# Patient Record
Sex: Male | Born: 1935 | Race: White | Hispanic: No | Marital: Married | Smoking: Former smoker
Health system: Southern US, Community
[De-identification: ages and names within clinical notes are randomized; demographics above are authoritative.]

## PROBLEM LIST (undated history)

## (undated) DIAGNOSIS — I639 Cerebral infarction, unspecified: Secondary | ICD-10-CM

## (undated) DIAGNOSIS — N289 Disorder of kidney and ureter, unspecified: Secondary | ICD-10-CM

## (undated) DIAGNOSIS — E1140 Type 2 diabetes mellitus with diabetic neuropathy, unspecified: Secondary | ICD-10-CM

## (undated) DIAGNOSIS — G629 Polyneuropathy, unspecified: Secondary | ICD-10-CM

## (undated) DIAGNOSIS — H3530 Unspecified macular degeneration: Secondary | ICD-10-CM

## (undated) DIAGNOSIS — E785 Hyperlipidemia, unspecified: Secondary | ICD-10-CM

## (undated) DIAGNOSIS — G459 Transient cerebral ischemic attack, unspecified: Secondary | ICD-10-CM

## (undated) DIAGNOSIS — E119 Type 2 diabetes mellitus without complications: Secondary | ICD-10-CM

## (undated) DIAGNOSIS — I10 Essential (primary) hypertension: Secondary | ICD-10-CM

## (undated) HISTORY — DX: Hyperlipidemia, unspecified: E78.5

## (undated) HISTORY — PX: OTHER SURGICAL HISTORY: SHX169

## (undated) HISTORY — PX: APPENDECTOMY: SHX54

## (undated) HISTORY — DX: Essential (primary) hypertension: I10

## (undated) HISTORY — DX: Transient cerebral ischemic attack, unspecified: G45.9

## (undated) HISTORY — PX: BACK SURGERY: SHX140

## (undated) HISTORY — PX: TONSILLECTOMY: SUR1361

## (undated) HISTORY — DX: Polyneuropathy, unspecified: G62.9

## (undated) HISTORY — DX: Disorder of kidney and ureter, unspecified: N28.9

## (undated) HISTORY — PX: CERVICAL DISC SURGERY: SHX588

---

## 2000-02-15 ENCOUNTER — Encounter: Admission: RE | Admit: 2000-02-15 | Discharge: 2000-05-15 | Payer: Self-pay | Admitting: Internal Medicine

## 2001-07-01 ENCOUNTER — Emergency Department (HOSPITAL_COMMUNITY): Admission: EM | Admit: 2001-07-01 | Discharge: 2001-07-01 | Payer: Self-pay | Admitting: Emergency Medicine

## 2001-07-01 ENCOUNTER — Encounter: Payer: Self-pay | Admitting: Emergency Medicine

## 2001-07-03 ENCOUNTER — Encounter: Payer: Self-pay | Admitting: Emergency Medicine

## 2001-07-03 ENCOUNTER — Ambulatory Visit (HOSPITAL_COMMUNITY): Admission: RE | Admit: 2001-07-03 | Discharge: 2001-07-03 | Payer: Self-pay | Admitting: Emergency Medicine

## 2001-10-03 ENCOUNTER — Encounter: Payer: Self-pay | Admitting: Internal Medicine

## 2001-10-03 ENCOUNTER — Encounter: Admission: RE | Admit: 2001-10-03 | Discharge: 2001-10-03 | Payer: Self-pay | Admitting: Internal Medicine

## 2004-11-17 ENCOUNTER — Encounter: Admission: RE | Admit: 2004-11-17 | Discharge: 2004-11-17 | Payer: Self-pay | Admitting: Internal Medicine

## 2005-02-17 ENCOUNTER — Inpatient Hospital Stay (HOSPITAL_COMMUNITY): Admission: RE | Admit: 2005-02-17 | Discharge: 2005-02-18 | Payer: Self-pay | Admitting: Neurosurgery

## 2006-03-12 ENCOUNTER — Encounter (INDEPENDENT_AMBULATORY_CARE_PROVIDER_SITE_OTHER): Payer: Self-pay | Admitting: Specialist

## 2006-03-12 ENCOUNTER — Ambulatory Visit (HOSPITAL_COMMUNITY): Admission: RE | Admit: 2006-03-12 | Discharge: 2006-03-12 | Payer: Self-pay | Admitting: *Deleted

## 2008-09-17 ENCOUNTER — Encounter: Admission: RE | Admit: 2008-09-17 | Discharge: 2008-09-17 | Payer: Self-pay | Admitting: Internal Medicine

## 2008-10-08 ENCOUNTER — Encounter (INDEPENDENT_AMBULATORY_CARE_PROVIDER_SITE_OTHER): Payer: Self-pay | Admitting: Neurosurgery

## 2008-10-08 ENCOUNTER — Ambulatory Visit (HOSPITAL_COMMUNITY): Admission: RE | Admit: 2008-10-08 | Discharge: 2008-10-08 | Payer: Self-pay | Admitting: Neurosurgery

## 2008-10-08 ENCOUNTER — Ambulatory Visit: Payer: Self-pay | Admitting: Vascular Surgery

## 2010-08-21 ENCOUNTER — Encounter: Payer: Self-pay | Admitting: Neurosurgery

## 2010-12-16 NOTE — Op Note (Signed)
NAMEKLAYTON, LEVATINO              ACCOUNT NO.:  [id number]   MEDICAL RECORD NO.:  [id number]          PATIENT TYPE:  AMB   LOCATION:  ENDO                         FACILITY:  [facility]   PHYSICIAN:  Waverly Ferrari, M.D.    DATE OF BIRTH:  [birth year]   DATE OF PROCEDURE:  DATE OF DISCHARGE:                                 OPERATIVE REPORT   PROCEDURE:  Colonoscopy with biopsy and eradication of polyp.   ANESTHESIA:  Demerol 100 mg, Versed 10 mg, epinephrine 1:10,000, 1 mL was  used.   PROCEDURE:  With the patient mildly sedated, in the left lateral decubitus  position, the Olympus videoscopic colonoscope was inserted into the rectum,  after normal rectal exam, passed under direct vision to the cecum,  identified by ileocecal valve and base of cecum, both of which were  photographed.  From this point colonoscope was slowly withdrawn, taking  circumferential views of colonic mucosa, stopping at 60 cm from anal verge,  at which point a polyp was seen and photographed.  It was a carpet-like  polyp, fairly superficial and spreading, and this was photographed and  subsequently we biopsied this area to obtain tissue, using the Erbe argon  photocoagulator with a straight shooting probe.  We eradicated the remainder  of the polyp.  There was some bleeding from the eradication site that I  elected to use the Erbe to burn a little further, but it continued to bleed.  Therefore, injected 1 mL of epinephrine into the site from which the blood  seemed to be emanating with cessation then of further bleeding.  A wheal was  seen to be raised subcutaneously by this method.  Once I was satisfied the  bleeding stopped and we eradicated the tissue to my satisfaction, the  colonoscope was then slowly withdrawn, taking circumferential views of  remaining colonic mucosa, stopping only in the rectum, which appeared  normal, and the rectum showed hemorrhoids on retroflexed view.  The  endoscope was straightened  and withdrawn.  The patient's vital signs and  pulse oximeter remained stable.  The patient tolerated procedure well  without apparent complications.   FINDINGS:  Large carpet-like polyp at 60 cm from anal verge, biopsied and  eradicated using argon photocoagulation.   PLAN:  Await biopsy report and clinical response.  The patient will call me  for results and follow up with me as an outpatient.           ______________________________  Waverly Ferrari, M.D.     GMO/MEDQ  D:  03/12/2006  T:  03/12/2006  Job:  [id number]

## 2010-12-16 NOTE — H&P (Signed)
NAME:  Kristopher Zimmerman, Kristopher Zimmerman              ACCOUNT NO.:  [id number]   MEDICAL RECORD NO.:  [id number]          PATIENT TYPE:  INP   LOCATION:  NA                           FACILITY:  [facility]   PHYSICIAN:  Leeroy Cha, M.D.   DATE OF BIRTH:  1923/09/26   DATE OF ADMISSION:  02/22/2005  DATE OF DISCHARGE:                                HISTORY & PHYSICAL   HISTORY OF PRESENT ILLNESS:  This was a young fellow who was seen by me  about two months ago in my office because of back pain radiation to both  legs which gets worse with walking.  He said the pain gets better.  He had  been complaining of burning sensation in both legs and according to him, he  felt that it was all secondary to diabetes.  He had x-rays.  He had failed  with conservative treatment.  He wanted to proceed with surgery.   PAST MEDICAL HISTORY:  Cervical diskectomy.   ALLERGIES:  He is not allergic to any medication.   SOCIAL HISTORY:  He drinks socially.  He does not smoke.   REVIEW OF SYSTEMS:  Positive for diabetes, cataracts, kidney stone.   PHYSICAL EXAMINATION:  GENERAL:  The patient came to my office, walking with  a short step.  HEENT:  Ears, nose and throat normal.  NECK:  Normal.  NODES:  Clear.  HEART:  Sounds normal.  There were no murmurs.  Normal pulses.  NEUROLOGICAL:  Mental status and cranial nerves are normal.  EXTREMITIES:  Shows weakness of the dorsi and plantar flexors in both legs.  He complains of a burning sensation in both feet.   The MRI showed that he has a severe stenosis at the level of 3-4 and 4-5  secondary to degenerative disk disease and ___________ facet.   CLINICAL IMPRESSION:  1.  Lumbar stenosis including neurogenic claudication.  2.  Diabetes mellitus.  3.  Degenerative disk disease lumbar.   RECOMMENDATIONS:  The patient wants to proceed with surgery.  The procedure  will be a decompression at the level of 3-5 with bilateral foraminotomy.  He  knows about the risks such as  infection, cerebrospinal fluid leak, worsening  of pain, paralysis, need for further surgery which might require fusion.     EB/MEDQ  D:  02/17/2005  T:  02/17/2005  Job:  [id number]

## 2010-12-16 NOTE — Op Note (Signed)
Kristopher Zimmerman, Kristopher Zimmerman              ACCOUNT NO.:  [id number]   MEDICAL RECORD NO.:  [id number]          PATIENT TYPE:  INP   LOCATION:  2899                         FACILITY:  [facility]   PHYSICIAN:  Leeroy Cha, M.D.   DATE OF BIRTH:  1924/01/07   DATE OF PROCEDURE:  02/17/2005  DATE OF DISCHARGE:                                 OPERATIVE REPORT   PREOPERATIVE DIAGNOSIS:  L3-5 stenosis with neurogenic claudication.   POSTOPERATIVE DIAGNOSIS:  L3-5 stenosis with neurogenic claudication.   PROCEDURE:  Bilateral L3-5 laminectomies and foraminotomy.   SURGEON:  Leeroy Cha, M.D.   ASSISTANT:  __________.   Patient is being admitted because of back pain with radiation to both legs.  Patient had by x-rays severe stenosis at L3-5.  The patient has a history of  diabetes.  He has failed conservative treatment.  He wants to proceed with  surgery.   PROCEDURE:  The patient was taken to the OR.  He was positioned in a prone  manner.  The back was prepped with Betadine.  A midline incision from L3  down to S1 was made.  Muscles were retracted laterally.  We identified the  spinous process of L3-5, which were removed with a Still rongeur.  Using the  drill as well as the Kerrison punch as well as the Leksell, we did bilateral  laminectomies, removing thick yellow ligament.  Then with the 2-3 mm  Kerrison punch, we did a foraminotomy to decompress the L3-4, L5-S1 nerve  roots.  Having good decompression, x-rays showed that we indeed were at the  level of L3-4 and L5-1.  Valsalva maneuver was negative.  From then on, the  area was irrigated and closed with Vicryl and Steri-Strips.       EB/MEDQ  D:  02/17/2005  T:  02/17/2005  Job:  [id number]

## 2011-09-11 DIAGNOSIS — I10 Essential (primary) hypertension: Secondary | ICD-10-CM | POA: Diagnosis not present

## 2011-09-11 DIAGNOSIS — E119 Type 2 diabetes mellitus without complications: Secondary | ICD-10-CM | POA: Diagnosis not present

## 2011-09-25 DIAGNOSIS — E782 Mixed hyperlipidemia: Secondary | ICD-10-CM | POA: Diagnosis not present

## 2011-09-25 DIAGNOSIS — I10 Essential (primary) hypertension: Secondary | ICD-10-CM | POA: Diagnosis not present

## 2011-09-25 DIAGNOSIS — E119 Type 2 diabetes mellitus without complications: Secondary | ICD-10-CM | POA: Diagnosis not present

## 2011-09-25 DIAGNOSIS — M159 Polyosteoarthritis, unspecified: Secondary | ICD-10-CM | POA: Diagnosis not present

## 2011-09-25 DIAGNOSIS — E1149 Type 2 diabetes mellitus with other diabetic neurological complication: Secondary | ICD-10-CM | POA: Diagnosis not present

## 2011-12-19 DIAGNOSIS — H26499 Other secondary cataract, unspecified eye: Secondary | ICD-10-CM | POA: Diagnosis not present

## 2012-01-01 ENCOUNTER — Ambulatory Visit (INDEPENDENT_AMBULATORY_CARE_PROVIDER_SITE_OTHER): Payer: Medicare Other | Admitting: Ophthalmology

## 2012-01-01 DIAGNOSIS — E11319 Type 2 diabetes mellitus with unspecified diabetic retinopathy without macular edema: Secondary | ICD-10-CM

## 2012-01-01 DIAGNOSIS — H35039 Hypertensive retinopathy, unspecified eye: Secondary | ICD-10-CM | POA: Diagnosis not present

## 2012-01-01 DIAGNOSIS — E1165 Type 2 diabetes mellitus with hyperglycemia: Secondary | ICD-10-CM

## 2012-01-01 DIAGNOSIS — I10 Essential (primary) hypertension: Secondary | ICD-10-CM

## 2012-01-01 DIAGNOSIS — H35059 Retinal neovascularization, unspecified, unspecified eye: Secondary | ICD-10-CM

## 2012-01-01 DIAGNOSIS — H43819 Vitreous degeneration, unspecified eye: Secondary | ICD-10-CM

## 2012-01-17 ENCOUNTER — Encounter (INDEPENDENT_AMBULATORY_CARE_PROVIDER_SITE_OTHER): Payer: Medicare Other | Admitting: Ophthalmology

## 2012-01-17 DIAGNOSIS — H35059 Retinal neovascularization, unspecified, unspecified eye: Secondary | ICD-10-CM

## 2012-02-16 ENCOUNTER — Encounter (INDEPENDENT_AMBULATORY_CARE_PROVIDER_SITE_OTHER): Payer: Medicare Other | Admitting: Ophthalmology

## 2012-02-16 DIAGNOSIS — H3530 Unspecified macular degeneration: Secondary | ICD-10-CM | POA: Diagnosis not present

## 2012-02-16 DIAGNOSIS — H35059 Retinal neovascularization, unspecified, unspecified eye: Secondary | ICD-10-CM | POA: Diagnosis not present

## 2012-03-15 DIAGNOSIS — E119 Type 2 diabetes mellitus without complications: Secondary | ICD-10-CM | POA: Diagnosis not present

## 2012-03-15 DIAGNOSIS — M159 Polyosteoarthritis, unspecified: Secondary | ICD-10-CM | POA: Diagnosis not present

## 2012-03-15 DIAGNOSIS — E782 Mixed hyperlipidemia: Secondary | ICD-10-CM | POA: Diagnosis not present

## 2012-03-15 DIAGNOSIS — I10 Essential (primary) hypertension: Secondary | ICD-10-CM | POA: Diagnosis not present

## 2012-03-15 DIAGNOSIS — E1129 Type 2 diabetes mellitus with other diabetic kidney complication: Secondary | ICD-10-CM | POA: Diagnosis not present

## 2012-03-18 ENCOUNTER — Encounter (INDEPENDENT_AMBULATORY_CARE_PROVIDER_SITE_OTHER): Payer: Medicare Other | Admitting: Ophthalmology

## 2012-03-18 DIAGNOSIS — H35059 Retinal neovascularization, unspecified, unspecified eye: Secondary | ICD-10-CM

## 2012-03-18 DIAGNOSIS — H3530 Unspecified macular degeneration: Secondary | ICD-10-CM | POA: Diagnosis not present

## 2012-03-21 DIAGNOSIS — E1142 Type 2 diabetes mellitus with diabetic polyneuropathy: Secondary | ICD-10-CM | POA: Diagnosis not present

## 2012-03-21 DIAGNOSIS — I10 Essential (primary) hypertension: Secondary | ICD-10-CM | POA: Diagnosis not present

## 2012-03-21 DIAGNOSIS — E1129 Type 2 diabetes mellitus with other diabetic kidney complication: Secondary | ICD-10-CM | POA: Diagnosis not present

## 2012-03-21 DIAGNOSIS — E782 Mixed hyperlipidemia: Secondary | ICD-10-CM | POA: Diagnosis not present

## 2012-03-21 DIAGNOSIS — E119 Type 2 diabetes mellitus without complications: Secondary | ICD-10-CM | POA: Diagnosis not present

## 2012-04-09 DIAGNOSIS — Z23 Encounter for immunization: Secondary | ICD-10-CM | POA: Diagnosis not present

## 2012-04-24 ENCOUNTER — Encounter (INDEPENDENT_AMBULATORY_CARE_PROVIDER_SITE_OTHER): Payer: Medicare Other | Admitting: Ophthalmology

## 2012-04-24 DIAGNOSIS — I10 Essential (primary) hypertension: Secondary | ICD-10-CM

## 2012-04-24 DIAGNOSIS — H43819 Vitreous degeneration, unspecified eye: Secondary | ICD-10-CM

## 2012-04-24 DIAGNOSIS — H35039 Hypertensive retinopathy, unspecified eye: Secondary | ICD-10-CM

## 2012-04-24 DIAGNOSIS — E1139 Type 2 diabetes mellitus with other diabetic ophthalmic complication: Secondary | ICD-10-CM | POA: Diagnosis not present

## 2012-04-24 DIAGNOSIS — E11319 Type 2 diabetes mellitus with unspecified diabetic retinopathy without macular edema: Secondary | ICD-10-CM

## 2012-04-24 DIAGNOSIS — H3530 Unspecified macular degeneration: Secondary | ICD-10-CM | POA: Diagnosis not present

## 2012-04-24 DIAGNOSIS — E1165 Type 2 diabetes mellitus with hyperglycemia: Secondary | ICD-10-CM | POA: Diagnosis not present

## 2012-04-24 DIAGNOSIS — H35059 Retinal neovascularization, unspecified, unspecified eye: Secondary | ICD-10-CM | POA: Diagnosis not present

## 2012-05-24 ENCOUNTER — Encounter (INDEPENDENT_AMBULATORY_CARE_PROVIDER_SITE_OTHER): Payer: Medicare Other | Admitting: Ophthalmology

## 2012-05-24 DIAGNOSIS — H35039 Hypertensive retinopathy, unspecified eye: Secondary | ICD-10-CM

## 2012-05-24 DIAGNOSIS — E11319 Type 2 diabetes mellitus with unspecified diabetic retinopathy without macular edema: Secondary | ICD-10-CM

## 2012-05-24 DIAGNOSIS — H43819 Vitreous degeneration, unspecified eye: Secondary | ICD-10-CM

## 2012-05-24 DIAGNOSIS — H3530 Unspecified macular degeneration: Secondary | ICD-10-CM

## 2012-05-24 DIAGNOSIS — I10 Essential (primary) hypertension: Secondary | ICD-10-CM

## 2012-05-24 DIAGNOSIS — H1045 Other chronic allergic conjunctivitis: Secondary | ICD-10-CM

## 2012-05-24 DIAGNOSIS — E1139 Type 2 diabetes mellitus with other diabetic ophthalmic complication: Secondary | ICD-10-CM

## 2012-05-27 DIAGNOSIS — Z299 Encounter for prophylactic measures, unspecified: Secondary | ICD-10-CM | POA: Diagnosis not present

## 2012-05-27 DIAGNOSIS — E119 Type 2 diabetes mellitus without complications: Secondary | ICD-10-CM | POA: Diagnosis not present

## 2012-05-27 DIAGNOSIS — I10 Essential (primary) hypertension: Secondary | ICD-10-CM | POA: Diagnosis not present

## 2012-05-27 DIAGNOSIS — R21 Rash and other nonspecific skin eruption: Secondary | ICD-10-CM | POA: Diagnosis not present

## 2012-06-05 ENCOUNTER — Encounter (INDEPENDENT_AMBULATORY_CARE_PROVIDER_SITE_OTHER): Payer: Medicare Other | Admitting: Ophthalmology

## 2012-06-05 DIAGNOSIS — I10 Essential (primary) hypertension: Secondary | ICD-10-CM

## 2012-06-05 DIAGNOSIS — H3530 Unspecified macular degeneration: Secondary | ICD-10-CM

## 2012-06-05 DIAGNOSIS — H35059 Retinal neovascularization, unspecified, unspecified eye: Secondary | ICD-10-CM

## 2012-06-05 DIAGNOSIS — H35039 Hypertensive retinopathy, unspecified eye: Secondary | ICD-10-CM

## 2012-06-05 DIAGNOSIS — E11319 Type 2 diabetes mellitus with unspecified diabetic retinopathy without macular edema: Secondary | ICD-10-CM

## 2012-06-05 DIAGNOSIS — E1165 Type 2 diabetes mellitus with hyperglycemia: Secondary | ICD-10-CM

## 2012-06-05 DIAGNOSIS — H43819 Vitreous degeneration, unspecified eye: Secondary | ICD-10-CM

## 2012-07-08 DIAGNOSIS — J0110 Acute frontal sinusitis, unspecified: Secondary | ICD-10-CM | POA: Diagnosis not present

## 2012-07-08 DIAGNOSIS — R5381 Other malaise: Secondary | ICD-10-CM | POA: Diagnosis not present

## 2012-07-08 DIAGNOSIS — R05 Cough: Secondary | ICD-10-CM | POA: Diagnosis not present

## 2012-07-08 DIAGNOSIS — R5383 Other fatigue: Secondary | ICD-10-CM | POA: Diagnosis not present

## 2012-08-02 DIAGNOSIS — H906 Mixed conductive and sensorineural hearing loss, bilateral: Secondary | ICD-10-CM | POA: Diagnosis not present

## 2012-08-05 ENCOUNTER — Encounter (INDEPENDENT_AMBULATORY_CARE_PROVIDER_SITE_OTHER): Payer: Medicare Other | Admitting: Ophthalmology

## 2012-08-05 DIAGNOSIS — H3530 Unspecified macular degeneration: Secondary | ICD-10-CM

## 2012-08-05 DIAGNOSIS — H35039 Hypertensive retinopathy, unspecified eye: Secondary | ICD-10-CM

## 2012-08-05 DIAGNOSIS — I10 Essential (primary) hypertension: Secondary | ICD-10-CM

## 2012-08-05 DIAGNOSIS — E1139 Type 2 diabetes mellitus with other diabetic ophthalmic complication: Secondary | ICD-10-CM

## 2012-08-05 DIAGNOSIS — H43819 Vitreous degeneration, unspecified eye: Secondary | ICD-10-CM

## 2012-08-05 DIAGNOSIS — E11319 Type 2 diabetes mellitus with unspecified diabetic retinopathy without macular edema: Secondary | ICD-10-CM

## 2012-08-06 DIAGNOSIS — H73829 Atrophic nonflaccid tympanic membrane, unspecified ear: Secondary | ICD-10-CM | POA: Diagnosis not present

## 2012-08-06 DIAGNOSIS — H6980 Other specified disorders of Eustachian tube, unspecified ear: Secondary | ICD-10-CM | POA: Diagnosis not present

## 2012-08-06 DIAGNOSIS — H6120 Impacted cerumen, unspecified ear: Secondary | ICD-10-CM | POA: Diagnosis not present

## 2012-08-06 DIAGNOSIS — H908 Mixed conductive and sensorineural hearing loss, unspecified: Secondary | ICD-10-CM | POA: Diagnosis not present

## 2012-09-06 DIAGNOSIS — H6980 Other specified disorders of Eustachian tube, unspecified ear: Secondary | ICD-10-CM | POA: Diagnosis not present

## 2012-09-06 DIAGNOSIS — H73829 Atrophic nonflaccid tympanic membrane, unspecified ear: Secondary | ICD-10-CM | POA: Diagnosis not present

## 2012-09-06 DIAGNOSIS — H908 Mixed conductive and sensorineural hearing loss, unspecified: Secondary | ICD-10-CM | POA: Diagnosis not present

## 2012-09-18 DIAGNOSIS — E782 Mixed hyperlipidemia: Secondary | ICD-10-CM | POA: Diagnosis not present

## 2012-09-18 DIAGNOSIS — I10 Essential (primary) hypertension: Secondary | ICD-10-CM | POA: Diagnosis not present

## 2012-09-18 DIAGNOSIS — E119 Type 2 diabetes mellitus without complications: Secondary | ICD-10-CM | POA: Diagnosis not present

## 2012-09-25 DIAGNOSIS — E782 Mixed hyperlipidemia: Secondary | ICD-10-CM | POA: Diagnosis not present

## 2012-09-25 DIAGNOSIS — I10 Essential (primary) hypertension: Secondary | ICD-10-CM | POA: Diagnosis not present

## 2012-09-25 DIAGNOSIS — M159 Polyosteoarthritis, unspecified: Secondary | ICD-10-CM | POA: Diagnosis not present

## 2012-09-25 DIAGNOSIS — E119 Type 2 diabetes mellitus without complications: Secondary | ICD-10-CM | POA: Diagnosis not present

## 2012-09-27 DIAGNOSIS — H6980 Other specified disorders of Eustachian tube, unspecified ear: Secondary | ICD-10-CM | POA: Diagnosis not present

## 2012-10-04 ENCOUNTER — Encounter (INDEPENDENT_AMBULATORY_CARE_PROVIDER_SITE_OTHER): Payer: Medicare Other | Admitting: Ophthalmology

## 2012-10-17 ENCOUNTER — Encounter (INDEPENDENT_AMBULATORY_CARE_PROVIDER_SITE_OTHER): Payer: Medicare Other | Admitting: Ophthalmology

## 2012-10-17 DIAGNOSIS — E11319 Type 2 diabetes mellitus with unspecified diabetic retinopathy without macular edema: Secondary | ICD-10-CM | POA: Diagnosis not present

## 2012-10-17 DIAGNOSIS — E1165 Type 2 diabetes mellitus with hyperglycemia: Secondary | ICD-10-CM | POA: Diagnosis not present

## 2012-10-17 DIAGNOSIS — H3530 Unspecified macular degeneration: Secondary | ICD-10-CM | POA: Diagnosis not present

## 2012-10-17 DIAGNOSIS — H43819 Vitreous degeneration, unspecified eye: Secondary | ICD-10-CM

## 2012-10-17 DIAGNOSIS — H35039 Hypertensive retinopathy, unspecified eye: Secondary | ICD-10-CM

## 2012-10-17 DIAGNOSIS — E1139 Type 2 diabetes mellitus with other diabetic ophthalmic complication: Secondary | ICD-10-CM | POA: Diagnosis not present

## 2012-10-17 DIAGNOSIS — I10 Essential (primary) hypertension: Secondary | ICD-10-CM

## 2012-10-28 DIAGNOSIS — H66019 Acute suppurative otitis media with spontaneous rupture of ear drum, unspecified ear: Secondary | ICD-10-CM | POA: Diagnosis not present

## 2012-11-06 DIAGNOSIS — H66019 Acute suppurative otitis media with spontaneous rupture of ear drum, unspecified ear: Secondary | ICD-10-CM | POA: Diagnosis not present

## 2012-11-06 DIAGNOSIS — H6980 Other specified disorders of Eustachian tube, unspecified ear: Secondary | ICD-10-CM | POA: Diagnosis not present

## 2012-11-11 DIAGNOSIS — L719 Rosacea, unspecified: Secondary | ICD-10-CM | POA: Diagnosis not present

## 2012-11-11 DIAGNOSIS — H0019 Chalazion unspecified eye, unspecified eyelid: Secondary | ICD-10-CM | POA: Diagnosis not present

## 2012-11-21 DIAGNOSIS — S61409A Unspecified open wound of unspecified hand, initial encounter: Secondary | ICD-10-CM | POA: Diagnosis not present

## 2012-11-21 DIAGNOSIS — L089 Local infection of the skin and subcutaneous tissue, unspecified: Secondary | ICD-10-CM | POA: Diagnosis not present

## 2012-12-02 DIAGNOSIS — T148XXA Other injury of unspecified body region, initial encounter: Secondary | ICD-10-CM | POA: Diagnosis not present

## 2012-12-02 DIAGNOSIS — L089 Local infection of the skin and subcutaneous tissue, unspecified: Secondary | ICD-10-CM | POA: Diagnosis not present

## 2012-12-18 DIAGNOSIS — H66019 Acute suppurative otitis media with spontaneous rupture of ear drum, unspecified ear: Secondary | ICD-10-CM | POA: Diagnosis not present

## 2012-12-18 DIAGNOSIS — H663X9 Other chronic suppurative otitis media, unspecified ear: Secondary | ICD-10-CM | POA: Diagnosis not present

## 2012-12-20 DIAGNOSIS — H02139 Senile ectropion of unspecified eye, unspecified eyelid: Secondary | ICD-10-CM | POA: Diagnosis not present

## 2012-12-20 DIAGNOSIS — L719 Rosacea, unspecified: Secondary | ICD-10-CM | POA: Diagnosis not present

## 2012-12-20 DIAGNOSIS — H16149 Punctate keratitis, unspecified eye: Secondary | ICD-10-CM | POA: Diagnosis not present

## 2012-12-21 ENCOUNTER — Encounter (HOSPITAL_COMMUNITY): Payer: Self-pay | Admitting: *Deleted

## 2012-12-21 ENCOUNTER — Emergency Department (HOSPITAL_COMMUNITY): Payer: Medicare Other

## 2012-12-21 ENCOUNTER — Emergency Department (HOSPITAL_COMMUNITY)
Admission: EM | Admit: 2012-12-21 | Discharge: 2012-12-21 | Disposition: A | Discharge status: Home / Self Care | Payer: Medicare Other | Attending: Emergency Medicine | Admitting: Emergency Medicine

## 2012-12-21 ENCOUNTER — Other Ambulatory Visit: Payer: Self-pay

## 2012-12-21 DIAGNOSIS — Z87891 Personal history of nicotine dependence: Secondary | ICD-10-CM | POA: Diagnosis not present

## 2012-12-21 DIAGNOSIS — Z792 Long term (current) use of antibiotics: Secondary | ICD-10-CM | POA: Insufficient documentation

## 2012-12-21 DIAGNOSIS — Z791 Long term (current) use of non-steroidal anti-inflammatories (NSAID): Secondary | ICD-10-CM | POA: Diagnosis not present

## 2012-12-21 DIAGNOSIS — Z79899 Other long term (current) drug therapy: Secondary | ICD-10-CM | POA: Diagnosis not present

## 2012-12-21 DIAGNOSIS — Z7982 Long term (current) use of aspirin: Secondary | ICD-10-CM | POA: Diagnosis not present

## 2012-12-21 DIAGNOSIS — G510 Bell's palsy: Secondary | ICD-10-CM

## 2012-12-21 DIAGNOSIS — R1310 Dysphagia, unspecified: Secondary | ICD-10-CM | POA: Diagnosis not present

## 2012-12-21 DIAGNOSIS — E1149 Type 2 diabetes mellitus with other diabetic neurological complication: Secondary | ICD-10-CM | POA: Insufficient documentation

## 2012-12-21 DIAGNOSIS — R29810 Facial weakness: Secondary | ICD-10-CM | POA: Diagnosis not present

## 2012-12-21 DIAGNOSIS — E1142 Type 2 diabetes mellitus with diabetic polyneuropathy: Secondary | ICD-10-CM | POA: Diagnosis not present

## 2012-12-21 DIAGNOSIS — Z789 Other specified health status: Secondary | ICD-10-CM | POA: Diagnosis not present

## 2012-12-21 HISTORY — DX: Type 2 diabetes mellitus with diabetic neuropathy, unspecified: E11.40

## 2012-12-21 HISTORY — DX: Type 2 diabetes mellitus without complications: E11.9

## 2012-12-21 LAB — DIFFERENTIAL
Basophils Absolute: 0 10*3/uL (ref 0.0–0.1)
Eosinophils Relative: 2 % (ref 0–5)
Lymphocytes Relative: 16 % (ref 12–46)
Monocytes Absolute: 0.6 10*3/uL (ref 0.1–1.0)
Monocytes Relative: 10 % (ref 3–12)
Neutro Abs: 4.4 10*3/uL (ref 1.7–7.7)

## 2012-12-21 LAB — URINALYSIS, ROUTINE W REFLEX MICROSCOPIC
Bilirubin Urine: NEGATIVE
Glucose, UA: NEGATIVE mg/dL
Hgb urine dipstick: NEGATIVE
Specific Gravity, Urine: 1.011 (ref 1.005–1.030)
Urobilinogen, UA: 0.2 mg/dL (ref 0.0–1.0)
pH: 6.5 (ref 5.0–8.0)

## 2012-12-21 LAB — COMPREHENSIVE METABOLIC PANEL
AST: 20 U/L (ref 0–37)
BUN: 26 mg/dL — ABNORMAL HIGH (ref 6–23)
CO2: 28 mEq/L (ref 19–32)
Calcium: 9.7 mg/dL (ref 8.4–10.5)
Chloride: 100 mEq/L (ref 96–112)
Creatinine, Ser: 1.34 mg/dL (ref 0.50–1.35)
GFR calc Af Amer: 54 mL/min — ABNORMAL LOW (ref >90)
GFR calc non Af Amer: 46 mL/min — ABNORMAL LOW (ref >90)
Glucose, Bld: 151 mg/dL — ABNORMAL HIGH (ref 70–99)
Total Bilirubin: 0.2 mg/dL — ABNORMAL LOW (ref 0.3–1.2)

## 2012-12-21 LAB — CBC
HCT: 31.1 % — ABNORMAL LOW (ref 39.0–52.0)
Hemoglobin: 10.5 g/dL — ABNORMAL LOW (ref 13.0–17.0)
MCV: 91.2 fL (ref 78.0–100.0)
RDW: 13.1 % (ref 11.5–15.5)
WBC: 6.2 10*3/uL (ref 4.0–10.5)

## 2012-12-21 LAB — RAPID URINE DRUG SCREEN, HOSP PERFORMED
Cocaine: NOT DETECTED
Opiates: NOT DETECTED
Tetrahydrocannabinol: NOT DETECTED

## 2012-12-21 LAB — GLUCOSE, CAPILLARY: Glucose-Capillary: 159 mg/dL — ABNORMAL HIGH (ref 70–99)

## 2012-12-21 LAB — TROPONIN I: Troponin I: <0.3 ng/mL (ref <0.30)

## 2012-12-21 LAB — ETHANOL: Alcohol, Ethyl (B): 19 mg/dL — ABNORMAL HIGH (ref 0–11)

## 2012-12-21 MED ORDER — VALACYCLOVIR HCL 1 G PO TABS: 1000.0000 mg | ORAL_TABLET | Freq: Three times a day (TID) | ORAL | Status: DC

## 2012-12-21 NOTE — ED Notes (Signed)
Per wife: pt had tubes put in bilat ears 2 weeks ago, has left ear drainage. Saw same MD, dx with ear infection, placed on antibiotic. 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 Placed on different antibiotic for eye and eyedrops. Pt c/o headache for 1 week. This morning pt woke with left sided facial drooping which has progressively gotten worse. Wife performed neuro check, no slurred speech or any other stroke symptoms. Hx of diabetic neuropathy

## 2012-12-21 NOTE — ED Provider Notes (Signed)
History     CSN: [id number]  Arrival date & time 12/21/12  1452   First MD Initiated Contact with Patient 12/21/12 1504      Chief Complaint  Patient presents with  . Facial Droop    (Consider location/radiation/quality/duration/timing/severity/associated sxs/prior treatment) HPI Comments: 79 year old male with a history of diabetes with neuropathy, recent problems with his eye on the left requiring topical antibiotic drops as well as oral antibiotic drops and problems with his left ear with infections requiring tympanostomy tube placement. The patient has been placed on topical antibiotics for his ear as well. He has had no other changes in his hearing but states that when he woke up this morning he had facial droop on the left, difficulty swallowing water because it drained out the side of his mouth. He had no problems with his vision, no problems with his speech, no difficulty ambulating and no difficulty using his hands. This has been persistent, nothing seems to make it better or worse, not associated with change in hearing or change in taste or change in sensation of the face. The family member presenting with the patient states that she took him to the ophthalmologist because his left eye seemed to be having a foreign body sensation this week. They told her that he had rosacea in his eye according to the patient's spouse and would require topical ophthalmologic antibiotics and oral antibiotics.  The history is provided by the patient.    Past Medical History  Diagnosis Date  . Diabetes mellitus without complication   . Neuropathy, diabetic     Past Surgical History  Procedure Laterality Date  . Back surgery      No family history on file.  History  Substance Use Topics  . Smoking status: Former Smoker    Quit date: 12/22/1979  . Smokeless tobacco: Not on file  . Alcohol Use: 0.6 oz/week    1 Glasses of wine per week     Comment: wine      Review of Systems  All  other systems reviewed and are negative.    Allergies  Review of patient's allergies indicates no known allergies.  Home Medications   Current Outpatient Rx  Name  Route  Sig  Dispense  Refill  . aspirin EC 81 MG tablet   Oral   Take 81 mg by mouth daily.         Marland Kitchen b complex vitamins tablet   Oral   Take 1 tablet by mouth daily.         . calcium carbonate (OS-CAL) 600 MG TABS   Oral   Take 600 mg by mouth 2 (two) times daily with a meal.         . clindamycin (CLEOCIN) 300 MG capsule   Oral   Take 300 mg by mouth 3 (three) times daily.         Marland Kitchen doxycycline (VIBRA-TABS) 100 MG tablet   Oral   Take 100 mg by mouth 2 (two) times daily.         . DULoxetine (CYMBALTA) 30 MG capsule   Oral   Take 30 mg by mouth daily. Take with a 60 to make 90         . DULoxetine (CYMBALTA) 60 MG capsule   Oral   Take 60 mg by mouth daily. Take with 30 to make 90         . GLYBURIDE-METFORMIN PO   Oral   Take 1 tablet  by mouth daily.         . hydrochlorothiazide (HYDRODIURIL) 25 MG tablet   Oral   Take 25 mg by mouth daily.         Marland Kitchen LORazepam (ATIVAN) 2 MG tablet   Oral   Take 2 mg by mouth at bedtime.         Marland Kitchen loteprednol (ALREX) 0.2 % SUSP   Ophthalmic   Apply 1 drop to eye 2 (two) times daily.         . Magnesium Citrate 100 MG TABS   Oral   Take 1 tablet by mouth daily.         . meloxicam (MOBIC) 15 MG tablet   Oral   Take 15 mg by mouth daily.         . pramipexole (MIRAPEX) 0.5 MG tablet   Oral   Take 0.5 mg by mouth 3 (three) times daily.         . pravastatin (PRAVACHOL) 40 MG tablet   Oral   Take 40 mg by mouth daily.         Marland Kitchen Propylene Glycol (SYSTANE BALANCE) 0.6 % SOLN   Ophthalmic   Apply 1 drop to eye 4 (four) times daily.         . pseudoephedrine-acetaminophen (TYLENOL SINUS) 30-500 MG TABS   Oral   Take 1 tablet by mouth every 4 (four) hours as needed (for pain).         . ramipril (ALTACE) 5 MG  capsule   Oral   Take 5 mg by mouth 2 (two) times daily.         . tamsulosin (FLOMAX) 0.4 MG CAPS   Oral   Take by mouth.         . tobramycin-dexamethasone (TOBRADEX) ophthalmic ointment   Left Ear   Place 1 application into the left ear 2 (two) times daily.         . vitamin C (ASCORBIC ACID) 500 MG tablet   Oral   Take 500 mg by mouth daily.         . valACYclovir (VALTREX) 1000 MG tablet   Oral   Take 1 tablet (1,000 mg total) by mouth 3 (three) times daily.   21 tablet   0     BP 147/76  Pulse 62  Temp(Src) 97.9 F (36.6 C)  Resp 18  SpO2 96%  Physical Exam  Nursing note and vitals reviewed. Constitutional: He appears well-developed and well-nourished. No distress.  HENT:  Head: Normocephalic and atraumatic.  Mouth/Throat: Oropharynx is clear and moist. No oropharyngeal exudate.  Left-sided facial droop, refer to neurologic exam, mucous membranes moist, tympanic membranes intact with tympanostomy tubes present, drainage from the tube on the left, no erythema, no tenderness with manipulation of the external ear  Eyes: Conjunctivae and EOM are normal. Pupils are equal, round, and reactive to light. Right eye exhibits no discharge. Left eye exhibits no discharge. No scleral icterus.  Palpebral conjunctiva of the left appears erythematous, bulbar conjunctiva is not inflamed, pupils are 2 mm and equal bilaterally, extraocular movements are intact  Neck: Normal range of motion. Neck supple. No JVD present. No thyromegaly present.  Cardiovascular: Normal rate, regular rhythm, normal heart sounds and intact distal pulses.  Exam reveals no gallop and no friction rub.   No murmur heard. No carotid bruit  Pulmonary/Chest: Effort normal and breath sounds normal. No respiratory distress. He has no wheezes. He has no rales.  Abdominal: Soft. Bowel sounds are normal. He exhibits no distension and no mass. There is no tenderness.  Musculoskeletal: Normal range of motion.  He exhibits no edema and no tenderness.  Lymphadenopathy:    He has no cervical adenopathy.  Neurological: He is alert. Coordination normal.  Speech is clear, all 4 extremities with normal strength and sensation, normal gait, normal finger-nose-finger, normal strength 5 out of 5, and normal visual acuity, left-sided facial droop present, left side of the forehead does not seem to rise with the right side of the forehead. The patient is unable to close his left eye tightly. Sensation intact to the face, cranial nerves III through XII otherwise intact.  Skin: Skin is warm and dry. No rash noted. No erythema.  Psychiatric: He has a normal mood and affect. His behavior is normal.    ED Course  Procedures (including critical care time)  Labs Reviewed  ETHANOL - Abnormal; Notable for the following:    Alcohol, Ethyl (B) 19 (*)    All other components within normal limits  CBC - Abnormal; Notable for the following:    RBC 3.41 (*)    Hemoglobin 10.5 (*)    HCT 31.1 (*)    All other components within normal limits  COMPREHENSIVE METABOLIC PANEL - Abnormal; Notable for the following:    Glucose, Bld 151 (*)    BUN 26 (*)    Albumin 3.3 (*)    Total Bilirubin 0.2 (*)    GFR calc non Af Amer 46 (*)    GFR calc Af Amer 54 (*)    All other components within normal limits  GLUCOSE, CAPILLARY - Abnormal; Notable for the following:    Glucose-Capillary 159 (*)    All other components within normal limits  PROTIME-INR  APTT  DIFFERENTIAL  TROPONIN I  URINE RAPID DRUG SCREEN (HOSP PERFORMED)  URINALYSIS, ROUTINE W REFLEX MICROSCOPIC   Ct Head Wo Contrast  12/21/2012   *RADIOLOGY REPORT*  Clinical Data: Nausea/vomiting, weakness, facial droop  CT HEAD WITHOUT CONTRAST  Technique:  Contiguous axial images were obtained from the base of the skull through the vertex without contrast.  Comparison: None.  Findings: No evidence of parenchymal hemorrhage or extra-axial fluid collection. No mass lesion,  mass effect, or midline shift.  No CT evidence of acute infarction.  Subcortical white matter and periventricular small vessel ischemic changes.  Cerebral volume is age appropriate.  No ventriculomegaly.  The visualized paranasal sinuses are essentially clear.  Minimal fluid in the left mastoid.  Right mastoid is under pneumatized.  No evidence of calvarial fracture.  IMPRESSION: No evidence of acute intracranial abnormality.  Small vessel ischemic changes.   Original Report Authenticated By: Julian Hy, M.D.     1. Bell's palsy       MDM  The patient has likely had a Bell's palsy, he has had recent difficulties with this year on the left, he has no other symptoms that would suggest a stroke like etiology however due to patient's age, history of diabetes and recent antibiotic use I will perform a stroke like workup with CT scan of the head and blood work. The patient awoke with the symptoms as he is not a candidate for any thrombolytic therapy should this be a stroke.   CT negative, VS stable, labs unremarkable - pt and family informed of results.  Likely Bell's, stable for d/c.  Return precautions given.  Meds given in ED:  Medications - No data to display  New Prescriptions   VALACYCLOVIR (VALTREX) 1000 MG TABLET    Take 1 tablet (1,000 mg total) by mouth 3 (three) times daily.         Johnna Acosta, MD 12/21/12 [phone]

## 2012-12-24 LAB — POCT I-STAT, CHEM 8
Calcium, Ion: 1.27 mmol/L (ref 1.13–1.30)
Creatinine, Ser: 1.3 mg/dL (ref 0.50–1.35)
Glucose, Bld: 148 mg/dL — ABNORMAL HIGH (ref 70–99)
HCT: 31 % — ABNORMAL LOW (ref 39.0–52.0)
Hemoglobin: 10.5 g/dL — ABNORMAL LOW (ref 13.0–17.0)
Potassium: 4.7 mEq/L (ref 3.5–5.1)
TCO2: 29 mmol/L (ref 0–100)

## 2012-12-24 LAB — POCT I-STAT TROPONIN I: Troponin i, poc: 0.01 ng/mL (ref 0.00–0.08)

## 2012-12-27 ENCOUNTER — Other Ambulatory Visit: Payer: Self-pay | Admitting: Internal Medicine

## 2012-12-27 DIAGNOSIS — R279 Unspecified lack of coordination: Secondary | ICD-10-CM | POA: Diagnosis not present

## 2012-12-27 DIAGNOSIS — G510 Bell's palsy: Secondary | ICD-10-CM | POA: Diagnosis not present

## 2012-12-27 DIAGNOSIS — M159 Polyosteoarthritis, unspecified: Secondary | ICD-10-CM | POA: Diagnosis not present

## 2012-12-27 DIAGNOSIS — I10 Essential (primary) hypertension: Secondary | ICD-10-CM | POA: Diagnosis not present

## 2012-12-27 DIAGNOSIS — E782 Mixed hyperlipidemia: Secondary | ICD-10-CM | POA: Diagnosis not present

## 2012-12-27 DIAGNOSIS — R51 Headache: Secondary | ICD-10-CM | POA: Diagnosis not present

## 2012-12-27 DIAGNOSIS — E119 Type 2 diabetes mellitus without complications: Secondary | ICD-10-CM | POA: Diagnosis not present

## 2012-12-27 DIAGNOSIS — H9190 Unspecified hearing loss, unspecified ear: Secondary | ICD-10-CM | POA: Diagnosis not present

## 2012-12-30 DIAGNOSIS — L719 Rosacea, unspecified: Secondary | ICD-10-CM | POA: Diagnosis not present

## 2012-12-30 DIAGNOSIS — Z961 Presence of intraocular lens: Secondary | ICD-10-CM | POA: Diagnosis not present

## 2012-12-30 DIAGNOSIS — G510 Bell's palsy: Secondary | ICD-10-CM | POA: Diagnosis not present

## 2012-12-30 DIAGNOSIS — H16219 Exposure keratoconjunctivitis, unspecified eye: Secondary | ICD-10-CM | POA: Diagnosis not present

## 2012-12-31 DIAGNOSIS — I10 Essential (primary) hypertension: Secondary | ICD-10-CM | POA: Diagnosis not present

## 2012-12-31 DIAGNOSIS — E119 Type 2 diabetes mellitus without complications: Secondary | ICD-10-CM | POA: Diagnosis not present

## 2012-12-31 DIAGNOSIS — G510 Bell's palsy: Secondary | ICD-10-CM | POA: Diagnosis not present

## 2012-12-31 DIAGNOSIS — E782 Mixed hyperlipidemia: Secondary | ICD-10-CM | POA: Diagnosis not present

## 2013-01-03 ENCOUNTER — Ambulatory Visit
Admission: RE | Admit: 2013-01-03 | Discharge: 2013-01-03 | Disposition: A | Discharge status: Home / Self Care | Payer: Medicare Other | Source: Ambulatory Visit | Attending: Internal Medicine | Admitting: Internal Medicine

## 2013-01-03 DIAGNOSIS — G510 Bell's palsy: Secondary | ICD-10-CM

## 2013-01-03 DIAGNOSIS — R51 Headache: Secondary | ICD-10-CM | POA: Diagnosis not present

## 2013-01-03 DIAGNOSIS — R42 Dizziness and giddiness: Secondary | ICD-10-CM | POA: Diagnosis not present

## 2013-01-13 DIAGNOSIS — H02139 Senile ectropion of unspecified eye, unspecified eyelid: Secondary | ICD-10-CM | POA: Diagnosis not present

## 2013-01-13 DIAGNOSIS — H16219 Exposure keratoconjunctivitis, unspecified eye: Secondary | ICD-10-CM | POA: Diagnosis not present

## 2013-01-13 DIAGNOSIS — G510 Bell's palsy: Secondary | ICD-10-CM | POA: Diagnosis not present

## 2013-01-13 DIAGNOSIS — L719 Rosacea, unspecified: Secondary | ICD-10-CM | POA: Diagnosis not present

## 2013-01-15 ENCOUNTER — Encounter (INDEPENDENT_AMBULATORY_CARE_PROVIDER_SITE_OTHER): Payer: Medicare Other | Admitting: Ophthalmology

## 2013-01-15 DIAGNOSIS — E1139 Type 2 diabetes mellitus with other diabetic ophthalmic complication: Secondary | ICD-10-CM

## 2013-01-15 DIAGNOSIS — H35039 Hypertensive retinopathy, unspecified eye: Secondary | ICD-10-CM

## 2013-01-15 DIAGNOSIS — H3530 Unspecified macular degeneration: Secondary | ICD-10-CM

## 2013-01-15 DIAGNOSIS — E11319 Type 2 diabetes mellitus with unspecified diabetic retinopathy without macular edema: Secondary | ICD-10-CM | POA: Diagnosis not present

## 2013-01-15 DIAGNOSIS — I10 Essential (primary) hypertension: Secondary | ICD-10-CM

## 2013-01-15 DIAGNOSIS — H43819 Vitreous degeneration, unspecified eye: Secondary | ICD-10-CM

## 2013-01-27 DIAGNOSIS — G510 Bell's palsy: Secondary | ICD-10-CM | POA: Diagnosis not present

## 2013-01-27 DIAGNOSIS — E782 Mixed hyperlipidemia: Secondary | ICD-10-CM | POA: Diagnosis not present

## 2013-01-27 DIAGNOSIS — I10 Essential (primary) hypertension: Secondary | ICD-10-CM | POA: Diagnosis not present

## 2013-02-11 DIAGNOSIS — H353290 Exudative age-related macular degeneration, unspecified eye, stage unspecified: Secondary | ICD-10-CM | POA: Diagnosis not present

## 2013-02-11 DIAGNOSIS — E119 Type 2 diabetes mellitus without complications: Secondary | ICD-10-CM | POA: Diagnosis not present

## 2013-02-11 DIAGNOSIS — H35059 Retinal neovascularization, unspecified, unspecified eye: Secondary | ICD-10-CM | POA: Diagnosis not present

## 2013-02-11 DIAGNOSIS — H04129 Dry eye syndrome of unspecified lacrimal gland: Secondary | ICD-10-CM | POA: Diagnosis not present

## 2013-02-11 DIAGNOSIS — H16149 Punctate keratitis, unspecified eye: Secondary | ICD-10-CM | POA: Diagnosis not present

## 2013-02-12 ENCOUNTER — Encounter (INDEPENDENT_AMBULATORY_CARE_PROVIDER_SITE_OTHER): Payer: Medicare Other | Admitting: Ophthalmology

## 2013-02-12 DIAGNOSIS — E11319 Type 2 diabetes mellitus with unspecified diabetic retinopathy without macular edema: Secondary | ICD-10-CM

## 2013-02-12 DIAGNOSIS — S0550XA Penetrating wound with foreign body of unspecified eyeball, initial encounter: Secondary | ICD-10-CM

## 2013-02-12 DIAGNOSIS — H35039 Hypertensive retinopathy, unspecified eye: Secondary | ICD-10-CM

## 2013-02-12 DIAGNOSIS — I10 Essential (primary) hypertension: Secondary | ICD-10-CM | POA: Diagnosis not present

## 2013-02-12 DIAGNOSIS — H43819 Vitreous degeneration, unspecified eye: Secondary | ICD-10-CM

## 2013-02-12 DIAGNOSIS — H3530 Unspecified macular degeneration: Secondary | ICD-10-CM | POA: Diagnosis not present

## 2013-02-12 DIAGNOSIS — E1139 Type 2 diabetes mellitus with other diabetic ophthalmic complication: Secondary | ICD-10-CM

## 2013-02-14 DIAGNOSIS — H663X9 Other chronic suppurative otitis media, unspecified ear: Secondary | ICD-10-CM | POA: Diagnosis not present

## 2013-03-04 DIAGNOSIS — E1142 Type 2 diabetes mellitus with diabetic polyneuropathy: Secondary | ICD-10-CM | POA: Diagnosis not present

## 2013-03-04 DIAGNOSIS — R079 Chest pain, unspecified: Secondary | ICD-10-CM | POA: Diagnosis not present

## 2013-03-04 DIAGNOSIS — IMO0002 Reserved for concepts with insufficient information to code with codable children: Secondary | ICD-10-CM | POA: Diagnosis not present

## 2013-03-05 DIAGNOSIS — H6980 Other specified disorders of Eustachian tube, unspecified ear: Secondary | ICD-10-CM | POA: Diagnosis not present

## 2013-03-05 DIAGNOSIS — H66019 Acute suppurative otitis media with spontaneous rupture of ear drum, unspecified ear: Secondary | ICD-10-CM | POA: Diagnosis not present

## 2013-03-12 ENCOUNTER — Encounter (INDEPENDENT_AMBULATORY_CARE_PROVIDER_SITE_OTHER): Payer: Medicare Other | Admitting: Ophthalmology

## 2013-03-12 DIAGNOSIS — I10 Essential (primary) hypertension: Secondary | ICD-10-CM

## 2013-03-12 DIAGNOSIS — H43819 Vitreous degeneration, unspecified eye: Secondary | ICD-10-CM

## 2013-03-12 DIAGNOSIS — H3530 Unspecified macular degeneration: Secondary | ICD-10-CM

## 2013-03-12 DIAGNOSIS — H353290 Exudative age-related macular degeneration, unspecified eye, stage unspecified: Secondary | ICD-10-CM | POA: Diagnosis not present

## 2013-03-12 DIAGNOSIS — H35039 Hypertensive retinopathy, unspecified eye: Secondary | ICD-10-CM

## 2013-03-12 DIAGNOSIS — E11319 Type 2 diabetes mellitus with unspecified diabetic retinopathy without macular edema: Secondary | ICD-10-CM

## 2013-03-25 DIAGNOSIS — Z1331 Encounter for screening for depression: Secondary | ICD-10-CM | POA: Diagnosis not present

## 2013-03-25 DIAGNOSIS — E782 Mixed hyperlipidemia: Secondary | ICD-10-CM | POA: Diagnosis not present

## 2013-03-25 DIAGNOSIS — Z0000 Encounter for general adult medical examination without abnormal findings: Secondary | ICD-10-CM | POA: Diagnosis not present

## 2013-03-25 DIAGNOSIS — E119 Type 2 diabetes mellitus without complications: Secondary | ICD-10-CM | POA: Diagnosis not present

## 2013-03-25 DIAGNOSIS — I10 Essential (primary) hypertension: Secondary | ICD-10-CM | POA: Diagnosis not present

## 2013-04-01 DIAGNOSIS — E119 Type 2 diabetes mellitus without complications: Secondary | ICD-10-CM | POA: Diagnosis not present

## 2013-04-01 DIAGNOSIS — E1149 Type 2 diabetes mellitus with other diabetic neurological complication: Secondary | ICD-10-CM | POA: Diagnosis not present

## 2013-04-01 DIAGNOSIS — M159 Polyosteoarthritis, unspecified: Secondary | ICD-10-CM | POA: Diagnosis not present

## 2013-04-01 DIAGNOSIS — E1142 Type 2 diabetes mellitus with diabetic polyneuropathy: Secondary | ICD-10-CM | POA: Diagnosis not present

## 2013-04-01 DIAGNOSIS — I10 Essential (primary) hypertension: Secondary | ICD-10-CM | POA: Diagnosis not present

## 2013-04-01 DIAGNOSIS — E782 Mixed hyperlipidemia: Secondary | ICD-10-CM | POA: Diagnosis not present

## 2013-04-02 DIAGNOSIS — Z23 Encounter for immunization: Secondary | ICD-10-CM | POA: Diagnosis not present

## 2013-04-09 ENCOUNTER — Encounter (INDEPENDENT_AMBULATORY_CARE_PROVIDER_SITE_OTHER): Payer: Medicare Other | Admitting: Ophthalmology

## 2013-04-09 DIAGNOSIS — H353290 Exudative age-related macular degeneration, unspecified eye, stage unspecified: Secondary | ICD-10-CM

## 2013-04-09 DIAGNOSIS — H3530 Unspecified macular degeneration: Secondary | ICD-10-CM

## 2013-04-09 DIAGNOSIS — H43819 Vitreous degeneration, unspecified eye: Secondary | ICD-10-CM

## 2013-04-09 DIAGNOSIS — H35039 Hypertensive retinopathy, unspecified eye: Secondary | ICD-10-CM

## 2013-04-09 DIAGNOSIS — I10 Essential (primary) hypertension: Secondary | ICD-10-CM | POA: Diagnosis not present

## 2013-05-06 DIAGNOSIS — L719 Rosacea, unspecified: Secondary | ICD-10-CM | POA: Diagnosis not present

## 2013-05-06 DIAGNOSIS — H16149 Punctate keratitis, unspecified eye: Secondary | ICD-10-CM | POA: Diagnosis not present

## 2013-05-06 DIAGNOSIS — H04129 Dry eye syndrome of unspecified lacrimal gland: Secondary | ICD-10-CM | POA: Diagnosis not present

## 2013-05-07 ENCOUNTER — Encounter (INDEPENDENT_AMBULATORY_CARE_PROVIDER_SITE_OTHER): Payer: Medicare Other | Admitting: Ophthalmology

## 2013-05-07 DIAGNOSIS — H35039 Hypertensive retinopathy, unspecified eye: Secondary | ICD-10-CM | POA: Diagnosis not present

## 2013-05-07 DIAGNOSIS — H353290 Exudative age-related macular degeneration, unspecified eye, stage unspecified: Secondary | ICD-10-CM | POA: Diagnosis not present

## 2013-05-07 DIAGNOSIS — H3530 Unspecified macular degeneration: Secondary | ICD-10-CM

## 2013-05-07 DIAGNOSIS — H43819 Vitreous degeneration, unspecified eye: Secondary | ICD-10-CM

## 2013-05-07 DIAGNOSIS — I10 Essential (primary) hypertension: Secondary | ICD-10-CM

## 2013-06-04 ENCOUNTER — Encounter (INDEPENDENT_AMBULATORY_CARE_PROVIDER_SITE_OTHER): Payer: Medicare Other | Admitting: Ophthalmology

## 2013-06-04 DIAGNOSIS — H43819 Vitreous degeneration, unspecified eye: Secondary | ICD-10-CM

## 2013-06-04 DIAGNOSIS — H353290 Exudative age-related macular degeneration, unspecified eye, stage unspecified: Secondary | ICD-10-CM | POA: Diagnosis not present

## 2013-06-04 DIAGNOSIS — H3530 Unspecified macular degeneration: Secondary | ICD-10-CM

## 2013-06-04 DIAGNOSIS — H35039 Hypertensive retinopathy, unspecified eye: Secondary | ICD-10-CM | POA: Diagnosis not present

## 2013-06-04 DIAGNOSIS — I10 Essential (primary) hypertension: Secondary | ICD-10-CM

## 2013-07-07 DIAGNOSIS — H04129 Dry eye syndrome of unspecified lacrimal gland: Secondary | ICD-10-CM | POA: Diagnosis not present

## 2013-07-07 DIAGNOSIS — L719 Rosacea, unspecified: Secondary | ICD-10-CM | POA: Diagnosis not present

## 2013-07-07 DIAGNOSIS — H01009 Unspecified blepharitis unspecified eye, unspecified eyelid: Secondary | ICD-10-CM | POA: Diagnosis not present

## 2013-07-09 ENCOUNTER — Encounter (INDEPENDENT_AMBULATORY_CARE_PROVIDER_SITE_OTHER): Payer: Medicare Other | Admitting: Ophthalmology

## 2013-07-09 DIAGNOSIS — H353290 Exudative age-related macular degeneration, unspecified eye, stage unspecified: Secondary | ICD-10-CM | POA: Diagnosis not present

## 2013-07-09 DIAGNOSIS — H43819 Vitreous degeneration, unspecified eye: Secondary | ICD-10-CM

## 2013-07-09 DIAGNOSIS — H3530 Unspecified macular degeneration: Secondary | ICD-10-CM

## 2013-07-09 DIAGNOSIS — I10 Essential (primary) hypertension: Secondary | ICD-10-CM | POA: Diagnosis not present

## 2013-07-09 DIAGNOSIS — H35039 Hypertensive retinopathy, unspecified eye: Secondary | ICD-10-CM

## 2013-07-12 DIAGNOSIS — I10 Essential (primary) hypertension: Secondary | ICD-10-CM | POA: Diagnosis not present

## 2013-07-12 DIAGNOSIS — E119 Type 2 diabetes mellitus without complications: Secondary | ICD-10-CM | POA: Diagnosis not present

## 2013-08-13 ENCOUNTER — Encounter (INDEPENDENT_AMBULATORY_CARE_PROVIDER_SITE_OTHER): Payer: Medicare Other | Admitting: Ophthalmology

## 2013-08-13 DIAGNOSIS — E11319 Type 2 diabetes mellitus with unspecified diabetic retinopathy without macular edema: Secondary | ICD-10-CM | POA: Diagnosis not present

## 2013-08-13 DIAGNOSIS — E1165 Type 2 diabetes mellitus with hyperglycemia: Secondary | ICD-10-CM

## 2013-08-13 DIAGNOSIS — E1139 Type 2 diabetes mellitus with other diabetic ophthalmic complication: Secondary | ICD-10-CM

## 2013-08-13 DIAGNOSIS — I10 Essential (primary) hypertension: Secondary | ICD-10-CM

## 2013-08-13 DIAGNOSIS — H353290 Exudative age-related macular degeneration, unspecified eye, stage unspecified: Secondary | ICD-10-CM | POA: Diagnosis not present

## 2013-08-13 DIAGNOSIS — H43819 Vitreous degeneration, unspecified eye: Secondary | ICD-10-CM

## 2013-08-13 DIAGNOSIS — H3530 Unspecified macular degeneration: Secondary | ICD-10-CM

## 2013-08-13 DIAGNOSIS — H35039 Hypertensive retinopathy, unspecified eye: Secondary | ICD-10-CM

## 2013-08-26 DIAGNOSIS — E119 Type 2 diabetes mellitus without complications: Secondary | ICD-10-CM | POA: Diagnosis not present

## 2013-09-02 DIAGNOSIS — M4310 Spondylolisthesis, site unspecified: Secondary | ICD-10-CM | POA: Diagnosis not present

## 2013-09-02 DIAGNOSIS — M47817 Spondylosis without myelopathy or radiculopathy, lumbosacral region: Secondary | ICD-10-CM | POA: Diagnosis not present

## 2013-09-02 DIAGNOSIS — M19019 Primary osteoarthritis, unspecified shoulder: Secondary | ICD-10-CM | POA: Diagnosis not present

## 2013-09-02 DIAGNOSIS — J060 Acute laryngopharyngitis: Secondary | ICD-10-CM | POA: Diagnosis not present

## 2013-09-02 DIAGNOSIS — I10 Essential (primary) hypertension: Secondary | ICD-10-CM | POA: Diagnosis not present

## 2013-09-02 DIAGNOSIS — M25519 Pain in unspecified shoulder: Secondary | ICD-10-CM | POA: Diagnosis not present

## 2013-09-02 DIAGNOSIS — M549 Dorsalgia, unspecified: Secondary | ICD-10-CM | POA: Diagnosis not present

## 2013-09-10 DIAGNOSIS — R059 Cough, unspecified: Secondary | ICD-10-CM | POA: Diagnosis not present

## 2013-09-10 DIAGNOSIS — R05 Cough: Secondary | ICD-10-CM | POA: Diagnosis not present

## 2013-09-10 DIAGNOSIS — E119 Type 2 diabetes mellitus without complications: Secondary | ICD-10-CM | POA: Diagnosis not present

## 2013-09-10 DIAGNOSIS — I10 Essential (primary) hypertension: Secondary | ICD-10-CM | POA: Diagnosis not present

## 2013-09-10 DIAGNOSIS — J060 Acute laryngopharyngitis: Secondary | ICD-10-CM | POA: Diagnosis not present

## 2013-09-17 ENCOUNTER — Encounter (INDEPENDENT_AMBULATORY_CARE_PROVIDER_SITE_OTHER): Payer: Medicare Other | Admitting: Ophthalmology

## 2013-09-17 DIAGNOSIS — E1165 Type 2 diabetes mellitus with hyperglycemia: Secondary | ICD-10-CM

## 2013-09-17 DIAGNOSIS — H35039 Hypertensive retinopathy, unspecified eye: Secondary | ICD-10-CM

## 2013-09-17 DIAGNOSIS — E11319 Type 2 diabetes mellitus with unspecified diabetic retinopathy without macular edema: Secondary | ICD-10-CM

## 2013-09-17 DIAGNOSIS — H353290 Exudative age-related macular degeneration, unspecified eye, stage unspecified: Secondary | ICD-10-CM | POA: Diagnosis not present

## 2013-09-17 DIAGNOSIS — E1139 Type 2 diabetes mellitus with other diabetic ophthalmic complication: Secondary | ICD-10-CM

## 2013-09-17 DIAGNOSIS — H3530 Unspecified macular degeneration: Secondary | ICD-10-CM | POA: Diagnosis not present

## 2013-09-17 DIAGNOSIS — I10 Essential (primary) hypertension: Secondary | ICD-10-CM

## 2013-09-17 DIAGNOSIS — H43819 Vitreous degeneration, unspecified eye: Secondary | ICD-10-CM

## 2013-10-06 DIAGNOSIS — H905 Unspecified sensorineural hearing loss: Secondary | ICD-10-CM | POA: Diagnosis not present

## 2013-10-06 DIAGNOSIS — H908 Mixed conductive and sensorineural hearing loss, unspecified: Secondary | ICD-10-CM | POA: Diagnosis not present

## 2013-10-06 DIAGNOSIS — H6980 Other specified disorders of Eustachian tube, unspecified ear: Secondary | ICD-10-CM | POA: Diagnosis not present

## 2013-10-13 DIAGNOSIS — H6980 Other specified disorders of Eustachian tube, unspecified ear: Secondary | ICD-10-CM | POA: Diagnosis not present

## 2013-10-22 ENCOUNTER — Encounter (INDEPENDENT_AMBULATORY_CARE_PROVIDER_SITE_OTHER): Payer: Medicare Other | Admitting: Ophthalmology

## 2013-10-22 DIAGNOSIS — I10 Essential (primary) hypertension: Secondary | ICD-10-CM

## 2013-10-22 DIAGNOSIS — H3530 Unspecified macular degeneration: Secondary | ICD-10-CM | POA: Diagnosis not present

## 2013-10-22 DIAGNOSIS — H35039 Hypertensive retinopathy, unspecified eye: Secondary | ICD-10-CM | POA: Diagnosis not present

## 2013-10-22 DIAGNOSIS — H43819 Vitreous degeneration, unspecified eye: Secondary | ICD-10-CM

## 2013-10-22 DIAGNOSIS — H353290 Exudative age-related macular degeneration, unspecified eye, stage unspecified: Secondary | ICD-10-CM

## 2013-10-22 DIAGNOSIS — E1139 Type 2 diabetes mellitus with other diabetic ophthalmic complication: Secondary | ICD-10-CM

## 2013-10-22 DIAGNOSIS — E11319 Type 2 diabetes mellitus with unspecified diabetic retinopathy without macular edema: Secondary | ICD-10-CM

## 2013-10-22 DIAGNOSIS — E1165 Type 2 diabetes mellitus with hyperglycemia: Secondary | ICD-10-CM

## 2013-10-29 ENCOUNTER — Ambulatory Visit (INDEPENDENT_AMBULATORY_CARE_PROVIDER_SITE_OTHER): Payer: Medicare Other | Admitting: Ophthalmology

## 2013-11-06 DIAGNOSIS — M549 Dorsalgia, unspecified: Secondary | ICD-10-CM | POA: Diagnosis not present

## 2013-11-06 DIAGNOSIS — E1142 Type 2 diabetes mellitus with diabetic polyneuropathy: Secondary | ICD-10-CM | POA: Diagnosis not present

## 2013-11-21 DIAGNOSIS — H6980 Other specified disorders of Eustachian tube, unspecified ear: Secondary | ICD-10-CM | POA: Diagnosis not present

## 2013-11-26 ENCOUNTER — Encounter (INDEPENDENT_AMBULATORY_CARE_PROVIDER_SITE_OTHER): Payer: Medicare Other | Admitting: Ophthalmology

## 2013-11-26 DIAGNOSIS — H353290 Exudative age-related macular degeneration, unspecified eye, stage unspecified: Secondary | ICD-10-CM | POA: Diagnosis not present

## 2013-11-26 DIAGNOSIS — E1139 Type 2 diabetes mellitus with other diabetic ophthalmic complication: Secondary | ICD-10-CM

## 2013-11-26 DIAGNOSIS — H43819 Vitreous degeneration, unspecified eye: Secondary | ICD-10-CM

## 2013-11-26 DIAGNOSIS — H3530 Unspecified macular degeneration: Secondary | ICD-10-CM | POA: Diagnosis not present

## 2013-11-26 DIAGNOSIS — E11319 Type 2 diabetes mellitus with unspecified diabetic retinopathy without macular edema: Secondary | ICD-10-CM

## 2013-11-26 DIAGNOSIS — E1165 Type 2 diabetes mellitus with hyperglycemia: Secondary | ICD-10-CM | POA: Diagnosis not present

## 2013-11-26 DIAGNOSIS — H35039 Hypertensive retinopathy, unspecified eye: Secondary | ICD-10-CM

## 2013-11-26 DIAGNOSIS — I10 Essential (primary) hypertension: Secondary | ICD-10-CM

## 2013-12-09 DIAGNOSIS — E119 Type 2 diabetes mellitus without complications: Secondary | ICD-10-CM | POA: Diagnosis not present

## 2013-12-09 DIAGNOSIS — L0291 Cutaneous abscess, unspecified: Secondary | ICD-10-CM | POA: Diagnosis not present

## 2013-12-09 DIAGNOSIS — L0390 Cellulitis, unspecified: Secondary | ICD-10-CM | POA: Diagnosis not present

## 2013-12-09 DIAGNOSIS — R5383 Other fatigue: Secondary | ICD-10-CM | POA: Diagnosis not present

## 2013-12-09 DIAGNOSIS — R5381 Other malaise: Secondary | ICD-10-CM | POA: Diagnosis not present

## 2013-12-09 DIAGNOSIS — IMO0001 Reserved for inherently not codable concepts without codable children: Secondary | ICD-10-CM | POA: Diagnosis not present

## 2013-12-16 DIAGNOSIS — L0390 Cellulitis, unspecified: Secondary | ICD-10-CM | POA: Diagnosis not present

## 2013-12-16 DIAGNOSIS — I10 Essential (primary) hypertension: Secondary | ICD-10-CM | POA: Diagnosis not present

## 2013-12-16 DIAGNOSIS — L0291 Cutaneous abscess, unspecified: Secondary | ICD-10-CM | POA: Diagnosis not present

## 2013-12-16 DIAGNOSIS — E782 Mixed hyperlipidemia: Secondary | ICD-10-CM | POA: Diagnosis not present

## 2013-12-16 DIAGNOSIS — E119 Type 2 diabetes mellitus without complications: Secondary | ICD-10-CM | POA: Diagnosis not present

## 2013-12-31 ENCOUNTER — Encounter (INDEPENDENT_AMBULATORY_CARE_PROVIDER_SITE_OTHER): Payer: Medicare Other | Admitting: Ophthalmology

## 2013-12-31 DIAGNOSIS — E1139 Type 2 diabetes mellitus with other diabetic ophthalmic complication: Secondary | ICD-10-CM | POA: Diagnosis not present

## 2013-12-31 DIAGNOSIS — E1165 Type 2 diabetes mellitus with hyperglycemia: Secondary | ICD-10-CM

## 2013-12-31 DIAGNOSIS — H35039 Hypertensive retinopathy, unspecified eye: Secondary | ICD-10-CM

## 2013-12-31 DIAGNOSIS — H3530 Unspecified macular degeneration: Secondary | ICD-10-CM

## 2013-12-31 DIAGNOSIS — E11319 Type 2 diabetes mellitus with unspecified diabetic retinopathy without macular edema: Secondary | ICD-10-CM | POA: Diagnosis not present

## 2013-12-31 DIAGNOSIS — H353290 Exudative age-related macular degeneration, unspecified eye, stage unspecified: Secondary | ICD-10-CM | POA: Diagnosis not present

## 2013-12-31 DIAGNOSIS — I10 Essential (primary) hypertension: Secondary | ICD-10-CM

## 2014-02-04 ENCOUNTER — Encounter (INDEPENDENT_AMBULATORY_CARE_PROVIDER_SITE_OTHER): Payer: Medicare Other | Admitting: Ophthalmology

## 2014-02-04 DIAGNOSIS — H3530 Unspecified macular degeneration: Secondary | ICD-10-CM | POA: Diagnosis not present

## 2014-02-04 DIAGNOSIS — H43819 Vitreous degeneration, unspecified eye: Secondary | ICD-10-CM

## 2014-02-04 DIAGNOSIS — H35039 Hypertensive retinopathy, unspecified eye: Secondary | ICD-10-CM

## 2014-02-04 DIAGNOSIS — E1139 Type 2 diabetes mellitus with other diabetic ophthalmic complication: Secondary | ICD-10-CM

## 2014-02-04 DIAGNOSIS — E1165 Type 2 diabetes mellitus with hyperglycemia: Secondary | ICD-10-CM

## 2014-02-04 DIAGNOSIS — E11319 Type 2 diabetes mellitus with unspecified diabetic retinopathy without macular edema: Secondary | ICD-10-CM | POA: Diagnosis not present

## 2014-02-04 DIAGNOSIS — H353290 Exudative age-related macular degeneration, unspecified eye, stage unspecified: Secondary | ICD-10-CM | POA: Diagnosis not present

## 2014-02-04 DIAGNOSIS — I10 Essential (primary) hypertension: Secondary | ICD-10-CM

## 2014-02-10 DIAGNOSIS — H353190 Nonexudative age-related macular degeneration, unspecified eye, stage unspecified: Secondary | ICD-10-CM | POA: Diagnosis not present

## 2014-02-10 DIAGNOSIS — E1139 Type 2 diabetes mellitus with other diabetic ophthalmic complication: Secondary | ICD-10-CM | POA: Diagnosis not present

## 2014-02-10 DIAGNOSIS — H35039 Hypertensive retinopathy, unspecified eye: Secondary | ICD-10-CM | POA: Diagnosis not present

## 2014-02-10 DIAGNOSIS — H524 Presbyopia: Secondary | ICD-10-CM | POA: Diagnosis not present

## 2014-02-10 DIAGNOSIS — E119 Type 2 diabetes mellitus without complications: Secondary | ICD-10-CM | POA: Diagnosis not present

## 2014-03-11 ENCOUNTER — Encounter (INDEPENDENT_AMBULATORY_CARE_PROVIDER_SITE_OTHER): Payer: Medicare Other | Admitting: Ophthalmology

## 2014-03-25 ENCOUNTER — Encounter (INDEPENDENT_AMBULATORY_CARE_PROVIDER_SITE_OTHER): Payer: Medicare Other | Admitting: Ophthalmology

## 2014-03-25 DIAGNOSIS — E1165 Type 2 diabetes mellitus with hyperglycemia: Secondary | ICD-10-CM

## 2014-03-25 DIAGNOSIS — H353290 Exudative age-related macular degeneration, unspecified eye, stage unspecified: Secondary | ICD-10-CM

## 2014-03-25 DIAGNOSIS — E11319 Type 2 diabetes mellitus with unspecified diabetic retinopathy without macular edema: Secondary | ICD-10-CM | POA: Diagnosis not present

## 2014-03-25 DIAGNOSIS — H35039 Hypertensive retinopathy, unspecified eye: Secondary | ICD-10-CM

## 2014-03-25 DIAGNOSIS — H3530 Unspecified macular degeneration: Secondary | ICD-10-CM | POA: Diagnosis not present

## 2014-03-25 DIAGNOSIS — E1139 Type 2 diabetes mellitus with other diabetic ophthalmic complication: Secondary | ICD-10-CM

## 2014-03-25 DIAGNOSIS — I10 Essential (primary) hypertension: Secondary | ICD-10-CM

## 2014-03-25 DIAGNOSIS — H43819 Vitreous degeneration, unspecified eye: Secondary | ICD-10-CM

## 2014-04-13 DIAGNOSIS — L0291 Cutaneous abscess, unspecified: Secondary | ICD-10-CM | POA: Diagnosis not present

## 2014-04-13 DIAGNOSIS — Z0000 Encounter for general adult medical examination without abnormal findings: Secondary | ICD-10-CM | POA: Diagnosis not present

## 2014-04-13 DIAGNOSIS — I10 Essential (primary) hypertension: Secondary | ICD-10-CM | POA: Diagnosis not present

## 2014-04-13 DIAGNOSIS — E119 Type 2 diabetes mellitus without complications: Secondary | ICD-10-CM | POA: Diagnosis not present

## 2014-04-13 DIAGNOSIS — L0390 Cellulitis, unspecified: Secondary | ICD-10-CM | POA: Diagnosis not present

## 2014-04-13 DIAGNOSIS — Z1331 Encounter for screening for depression: Secondary | ICD-10-CM | POA: Diagnosis not present

## 2014-04-21 DIAGNOSIS — E119 Type 2 diabetes mellitus without complications: Secondary | ICD-10-CM | POA: Diagnosis not present

## 2014-04-21 DIAGNOSIS — Z23 Encounter for immunization: Secondary | ICD-10-CM | POA: Diagnosis not present

## 2014-04-21 DIAGNOSIS — E1129 Type 2 diabetes mellitus with other diabetic kidney complication: Secondary | ICD-10-CM | POA: Diagnosis not present

## 2014-04-21 DIAGNOSIS — I10 Essential (primary) hypertension: Secondary | ICD-10-CM | POA: Diagnosis not present

## 2014-04-21 DIAGNOSIS — I447 Left bundle-branch block, unspecified: Secondary | ICD-10-CM | POA: Diagnosis not present

## 2014-05-13 ENCOUNTER — Encounter (INDEPENDENT_AMBULATORY_CARE_PROVIDER_SITE_OTHER): Payer: Medicare Other | Admitting: Ophthalmology

## 2014-05-19 DIAGNOSIS — E119 Type 2 diabetes mellitus without complications: Secondary | ICD-10-CM | POA: Diagnosis not present

## 2014-05-19 DIAGNOSIS — I10 Essential (primary) hypertension: Secondary | ICD-10-CM | POA: Diagnosis not present

## 2014-05-19 DIAGNOSIS — E782 Mixed hyperlipidemia: Secondary | ICD-10-CM | POA: Diagnosis not present

## 2014-05-20 ENCOUNTER — Encounter (INDEPENDENT_AMBULATORY_CARE_PROVIDER_SITE_OTHER): Payer: Medicare Other | Admitting: Ophthalmology

## 2014-05-20 ENCOUNTER — Ambulatory Visit: Payer: Self-pay | Admitting: Cardiology

## 2014-05-20 DIAGNOSIS — H3532 Exudative age-related macular degeneration: Secondary | ICD-10-CM

## 2014-05-20 DIAGNOSIS — E11319 Type 2 diabetes mellitus with unspecified diabetic retinopathy without macular edema: Secondary | ICD-10-CM

## 2014-05-20 DIAGNOSIS — I10 Essential (primary) hypertension: Secondary | ICD-10-CM

## 2014-05-20 DIAGNOSIS — H3531 Nonexudative age-related macular degeneration: Secondary | ICD-10-CM | POA: Diagnosis not present

## 2014-05-20 DIAGNOSIS — H43813 Vitreous degeneration, bilateral: Secondary | ICD-10-CM

## 2014-05-20 DIAGNOSIS — E11329 Type 2 diabetes mellitus with mild nonproliferative diabetic retinopathy without macular edema: Secondary | ICD-10-CM

## 2014-05-20 DIAGNOSIS — H35033 Hypertensive retinopathy, bilateral: Secondary | ICD-10-CM

## 2014-05-27 ENCOUNTER — Ambulatory Visit (INDEPENDENT_AMBULATORY_CARE_PROVIDER_SITE_OTHER): Payer: Medicare Other | Admitting: Cardiology

## 2014-05-27 ENCOUNTER — Encounter: Payer: Self-pay | Admitting: Cardiology

## 2014-05-27 ENCOUNTER — Encounter: Payer: Self-pay | Admitting: *Deleted

## 2014-05-27 ENCOUNTER — Ambulatory Visit: Payer: Self-pay | Admitting: Cardiology

## 2014-05-27 VITALS — BP 140/60 | HR 70 | Ht 69.5 in | Wt 204.7 lb

## 2014-05-27 DIAGNOSIS — R079 Chest pain, unspecified: Secondary | ICD-10-CM | POA: Insufficient documentation

## 2014-05-27 DIAGNOSIS — I359 Nonrheumatic aortic valve disorder, unspecified: Secondary | ICD-10-CM | POA: Diagnosis not present

## 2014-05-27 DIAGNOSIS — E785 Hyperlipidemia, unspecified: Secondary | ICD-10-CM | POA: Insufficient documentation

## 2014-05-27 DIAGNOSIS — I350 Nonrheumatic aortic (valve) stenosis: Secondary | ICD-10-CM | POA: Insufficient documentation

## 2014-05-27 DIAGNOSIS — R9431 Abnormal electrocardiogram [ECG] [EKG]: Secondary | ICD-10-CM | POA: Diagnosis not present

## 2014-05-27 DIAGNOSIS — R072 Precordial pain: Secondary | ICD-10-CM | POA: Diagnosis not present

## 2014-05-27 DIAGNOSIS — I10 Essential (primary) hypertension: Secondary | ICD-10-CM

## 2014-05-27 NOTE — Patient Instructions (Signed)
Your physician recommends that you schedule a follow-up appointment in: [place] has requested that you have an echocardiogram. Echocardiography is a painless test that uses sound waves to create images of your heart. It provides your doctor with information about the size and shape of your heart and how well your heart's chambers and valves are working. This procedure takes approximately one hour. There are no restrictions for this procedure.   Your physician has requested that you have a lexiscan myoview. For further information please visit [email or web address]. Please follow instruction sheet, as given.

## 2014-05-27 NOTE — Progress Notes (Signed)
HPI: 80 year old male for evaluation of abnormal electrocardiogram. Patient states that he tires easily but denies dyspnea. No orthopnea or PND. Occasional minimal pedal edema. He has had pain in his left arm, left shoulder and back approximately 2 weeks ago that increased with movements. There was no chest pain. He denies exertional chest pain. He was seen by his primary care physician recently and noted to have a left bundle branch block which was new. Cardiology asked to evaluate.  Current Outpatient Prescriptions  Medication Sig Dispense Refill  . aspirin EC 81 MG tablet Take 81 mg by mouth daily.      Marland Kitchen b complex vitamins tablet Take 1 tablet by mouth daily.      . calcium carbonate (OS-CAL) 600 MG TABS Take 600 mg by mouth daily with breakfast.       . doxycycline (VIBRA-TABS) 100 MG tablet Take 100 mg by mouth daily.       . DULoxetine (CYMBALTA) 30 MG capsule Take 30 mg by mouth daily. Take with a 60 to make 90      . DULoxetine (CYMBALTA) 60 MG capsule Take 60 mg by mouth daily. Take with 30 to make 90      . glyBURIDE-metformin (GLUCOVANCE) 2.5-500 MG per tablet Take 2 tablets by mouth daily with breakfast.      . hydrochlorothiazide (HYDRODIURIL) 25 MG tablet Take 25 mg by mouth daily.      . irbesartan (AVAPRO) 150 MG tablet Take 150 mg by mouth daily.      Marland Kitchen LORazepam (ATIVAN) 2 MG tablet Take 2 mg by mouth at bedtime.      . Magnesium Citrate 100 MG TABS Take 1 tablet by mouth daily.      . meloxicam (MOBIC) 15 MG tablet Take 15 mg by mouth daily.      . pramipexole (MIRAPEX) 0.5 MG tablet Take 0.5 mg by mouth 3 (three) times daily.      . pravastatin (PRAVACHOL) 40 MG tablet Take 40 mg by mouth daily.      Marland Kitchen Propylene Glycol (SYSTANE BALANCE) 0.6 % SOLN Apply 1 drop to eye 4 (four) times daily.      . tamsulosin (FLOMAX) 0.4 MG CAPS Take by mouth.      . vitamin C (ASCORBIC ACID) 500 MG tablet Take 500 mg by mouth daily.       No current facility-administered medications  for this visit.    No Known Allergies  Past Medical History  Diagnosis Date  . Diabetes mellitus without complication   . Neuropathy, diabetic   . Hypertension   . Renal insufficiency     Past Surgical History  Procedure Laterality Date  . Back surgery    . Broken leg    . Cervical disc surgery    . Tonsillectomy    . Appendectomy      History   Social History  . Marital Status: Married    Spouse Name: N/A    Number of Children: N/A  . Years of Education: N/A   Occupational History  . Not on file.   Social History Main Topics  . Smoking status: Former Smoker    Quit date: 12/22/1979  . Smokeless tobacco: Not on file  . Alcohol Use: 0.6 oz/week    1 Glasses of wine per week     Comment: wine  . Drug Use: Not on file  . Sexual Activity: Not on file   Other Topics Concern  .  Not on file   Social History Narrative  . No narrative on file    Family History  Problem Relation Age of Onset  . Cirrhosis Father   . Diabetes Brother   . Cancer - Lung Sister   . Alzheimer's disease Sister   . Heart disease Sister   . Cancer - Lung Brother     ROS: Peripheral neuropathy and arthralgias but no fevers or chills, productive cough, hemoptysis, dysphasia, odynophagia, melena, hematochezia, dysuria, hematuria, rash, seizure activity, orthopnea, PND, pedal edema, claudication. Remaining systems are negative.  Physical Exam:   Blood pressure 140/60, pulse 70, height 5' 9.5" (1.765 m), weight 204 lb 11.2 oz (92.851 kg).  General:  Well developed/well nourished in NAD Skin warm/dry Patient not depressed No peripheral clubbing Back-normal HEENT-normal/normal eyelids Neck supple/normal carotid upstroke bilaterally; no bruits; no JVD; no thyromegaly chest - CTA/ normal expansion CV - RRR/normal S1 and S2; no rubs or gallops;  PMI nondisplaced, 3/6 systolic murmur left sternal border. S2 is diminished. Abdomen -NT/ND, no HSM, no mass, + bowel sounds, no bruit 2+  femoral pulses, no bruits Ext-no edema, chords, 2+ DP Neuro-grossly nonfocal  ECG 04/21/2014-sinus rhythm, left bundle branch block.

## 2014-05-27 NOTE — Assessment & Plan Note (Signed)
Patient with aortic stenosis on examination. Schedule echocardiogram to quantify.

## 2014-05-27 NOTE — Assessment & Plan Note (Signed)
Continue present blood pressure medications. 

## 2014-05-27 NOTE — Assessment & Plan Note (Signed)
Atypical left upper extremity pain area. Schedule nuclear study for risk stratification.

## 2014-05-27 NOTE — Assessment & Plan Note (Signed)
Patient with left bundle branch block which is new and left upper extremity pain which is somewhat atypical and may be musculoskeletal. However he also has diabetes mellitus. Schedule nuclear study for risk stratification.

## 2014-05-27 NOTE — Assessment & Plan Note (Signed)
Continue statin. 

## 2014-05-29 ENCOUNTER — Ambulatory Visit: Payer: Medicare Other | Admitting: Internal Medicine

## 2014-06-09 ENCOUNTER — Telehealth (HOSPITAL_COMMUNITY): Payer: Self-pay

## 2014-06-09 NOTE — Telephone Encounter (Signed)
Encounter complete. 

## 2014-06-11 ENCOUNTER — Ambulatory Visit (HOSPITAL_COMMUNITY)
Admission: RE | Admit: 2014-06-11 | Discharge: 2014-06-11 | Disposition: A | Discharge status: Home / Self Care | Payer: Medicare Other | Source: Ambulatory Visit | Attending: Cardiology | Admitting: Cardiology

## 2014-06-11 ENCOUNTER — Ambulatory Visit (HOSPITAL_BASED_OUTPATIENT_CLINIC_OR_DEPARTMENT_OTHER)
Admission: RE | Admit: 2014-06-11 | Discharge: 2014-06-11 | Disposition: A | Discharge status: Home / Self Care | Payer: Medicare Other | Source: Ambulatory Visit | Attending: Cardiology | Admitting: Cardiology

## 2014-06-11 DIAGNOSIS — R9431 Abnormal electrocardiogram [ECG] [EKG]: Secondary | ICD-10-CM

## 2014-06-11 DIAGNOSIS — I447 Left bundle-branch block, unspecified: Secondary | ICD-10-CM | POA: Insufficient documentation

## 2014-06-11 DIAGNOSIS — E119 Type 2 diabetes mellitus without complications: Secondary | ICD-10-CM | POA: Insufficient documentation

## 2014-06-11 DIAGNOSIS — E663 Overweight: Secondary | ICD-10-CM | POA: Insufficient documentation

## 2014-06-11 DIAGNOSIS — I359 Nonrheumatic aortic valve disorder, unspecified: Secondary | ICD-10-CM

## 2014-06-11 DIAGNOSIS — R5383 Other fatigue: Secondary | ICD-10-CM | POA: Diagnosis present

## 2014-06-11 DIAGNOSIS — Z87891 Personal history of nicotine dependence: Secondary | ICD-10-CM | POA: Diagnosis not present

## 2014-06-11 DIAGNOSIS — Z8249 Family history of ischemic heart disease and other diseases of the circulatory system: Secondary | ICD-10-CM | POA: Insufficient documentation

## 2014-06-11 DIAGNOSIS — R42 Dizziness and giddiness: Secondary | ICD-10-CM | POA: Diagnosis present

## 2014-06-11 DIAGNOSIS — I358 Other nonrheumatic aortic valve disorders: Secondary | ICD-10-CM | POA: Insufficient documentation

## 2014-06-11 DIAGNOSIS — I10 Essential (primary) hypertension: Secondary | ICD-10-CM | POA: Diagnosis not present

## 2014-06-11 DIAGNOSIS — M79602 Pain in left arm: Secondary | ICD-10-CM | POA: Diagnosis present

## 2014-06-11 DIAGNOSIS — Z6829 Body mass index (BMI) 29.0-29.9, adult: Secondary | ICD-10-CM | POA: Diagnosis not present

## 2014-06-11 MED ORDER — REGADENOSON 0.4 MG/5ML IV SOLN
0.4000 mg | Freq: Once | INTRAVENOUS | Status: AC
  Administered 2014-06-11: 0.4 mg via INTRAVENOUS

## 2014-06-11 MED ORDER — TECHNETIUM TC 99M SESTAMIBI GENERIC - CARDIOLITE
30.0000 | Freq: Once | INTRAVENOUS | Status: AC | PRN
  Administered 2014-06-11: 30 via INTRAVENOUS

## 2014-06-11 MED ORDER — TECHNETIUM TC 99M SESTAMIBI GENERIC - CARDIOLITE
10.0000 | Freq: Once | INTRAVENOUS | Status: AC | PRN
  Administered 2014-06-11: 10 via INTRAVENOUS

## 2014-06-11 NOTE — Procedures (Addendum)
[name] [address] [place] [address] [id number]  Cardiology Nuclear Med Study  Kristopher Zimmerman is a 80 y.o. male     MRN : [id number]     DOB: 02/17/1924  Procedure Date: 06/11/2014  Nuclear Med Background Indication for Stress Test:  Evaluation for Ischemia History:  No prior cardiac or respiratory history reported;No prior NUC MPI for comparison. Cardiac Risk Factors: Family History - CAD, History of Smoking, Hypertension, LBBB, Lipids, NIDDM and Overweight  Symptoms:  Fatigue, Light-Headedness and Left arm pain.   Nuclear Pre-Procedure Caffeine/Decaff Intake:  9:00pm NPO After: 7:00am   IV Site: R Hand  IV 0.9% NS with Angio Cath:  22g  Chest Size (in):  44"  IV Started by: Rolene Course, RN  Height: 5' 9.5" (1.765 m)  Cup Size: n/a  BMI:  Body mass index is 29.7 kg/(m^2). Weight:  204 lb (92.534 kg)   Tech Comments:  n/a    Nuclear Med Study 1 or 2 day study: 1 day  Stress Test Type:  [place] Provider:  Kirk Ruths, MD   Resting Radionuclide: Technetium 45m Sestamibi  Resting Radionuclide Dose: 10.6 mCi   Stress Radionuclide:  Technetium 4m Sestamibi  Stress Radionuclide Dose: 31.5 mCi           Stress Protocol Rest HR: 71 Stress HR: 78  Rest BP: 164/89 Stress BP: 159/70  Exercise Time (min): n/a METS: n/a   Predicted Max HR: 132 bpm % Max HR: 62.88 bpm Rate Pressure Product: 13612  Dose of Adenosine (mg):  n/a Dose of Lexiscan: 0.4 mg  Dose of Atropine (mg): n/a Dose of Dobutamine: n/a mcg/kg/min (at max HR)  Stress Test Technologist: Leane Para, CCT Nuclear Technologist: Otho Perl, CNMT   Rest Procedure:  Myocardial perfusion imaging was performed at rest 45 minutes following the intravenous administration of Technetium 28m Sestamibi. Stress Procedure:  The patient received IV Lexiscan 0.4 mg over 15-seconds.  Technetium 52m Sestamibi injected IV at  30-seconds.  There were no significant changes with Lexiscan.  Quantitative spect images were obtained after a 45 minute delay.  Transient Ischemic Dilatation (Normal <1.22):  1.16 QGS EDV:  104 ml QGS ESV:  59 ml LV Ejection Fraction: 44%  .      Rest ECG: NSR with non-specific ST-T wave changes  Stress ECG: Patient developed LBBB with infusion.  QPS Raw Data Images:  Acquisition technically good; normal left ventricular size. Stress Images:  Normal homogeneous uptake in all areas of the myocardium. Rest Images:  Normal homogeneous uptake in all areas of the myocardium. Subtraction (SDS):  No evidence of ischemia.  Impression Exercise Capacity:  Lexiscan with no exercise. BP Response:  Normal blood pressure response. Clinical Symptoms:  No significant symptoms noted. ECG Impression:  Transient LBBB with infusion. Comparison with Prior Nuclear Study: No previous nuclear study performed  Overall Impression:  Intermediate risk stress nuclear study with normal perfusion; study felt to be intermediate risk due to decreased LV function.  LV Wall Motion:  Mild global hypokinesis.   Kirk Ruths, MD  06/11/2014 12:19 PM

## 2014-06-11 NOTE — Progress Notes (Signed)
2D Echocardiogram Complete.  06/11/2014   Kristopher Zimmerman, [place]

## 2014-07-01 ENCOUNTER — Encounter: Payer: Self-pay | Admitting: *Deleted

## 2014-07-02 NOTE — Progress Notes (Signed)
HPI: Follow-up aortic stenosis. Recently seen for abnormal electrocardiogram. Echocardiogram November 2015 showed normal LV function, mild aortic stenosis with a mean gradient of 10 mmHg, mild mitral regurgitation and mild biatrial enlargement. Nuclear study 2015 showed an ejection fraction of 44%. Perfusion was normal.   Current Outpatient Prescriptions  Medication Sig Dispense Refill  . aspirin EC 81 MG tablet Take 81 mg by mouth daily.    Marland Kitchen b complex vitamins tablet Take 1 tablet by mouth daily.    . calcium carbonate (OS-CAL) 600 MG TABS Take 600 mg by mouth daily with breakfast.     . doxycycline (VIBRA-TABS) 100 MG tablet Take 100 mg by mouth daily.     . DULoxetine (CYMBALTA) 30 MG capsule Take 30 mg by mouth daily. Take with a 60 to make 90    . DULoxetine (CYMBALTA) 60 MG capsule Take 60 mg by mouth daily. Take with 30 to make 90    . glyBURIDE-metformin (GLUCOVANCE) 2.5-500 MG per tablet Take 2 tablets by mouth daily with breakfast.    . hydrochlorothiazide (HYDRODIURIL) 25 MG tablet Take 25 mg by mouth daily.    . irbesartan (AVAPRO) 150 MG tablet Take 150 mg by mouth daily.    Marland Kitchen LORazepam (ATIVAN) 2 MG tablet Take 2 mg by mouth at bedtime.    . Magnesium Citrate 100 MG TABS Take 1 tablet by mouth daily.    . meloxicam (MOBIC) 15 MG tablet Take 15 mg by mouth daily.    . pramipexole (MIRAPEX) 0.5 MG tablet Take 0.5 mg by mouth 3 (three) times daily.    . pravastatin (PRAVACHOL) 40 MG tablet Take 40 mg by mouth daily.    Marland Kitchen Propylene Glycol (SYSTANE BALANCE) 0.6 % SOLN Apply 1 drop to eye 4 (four) times daily.    . tamsulosin (FLOMAX) 0.4 MG CAPS Take by mouth.    . vitamin C (ASCORBIC ACID) 500 MG tablet Take 500 mg by mouth daily.     No current facility-administered medications for this visit.     Past Medical History  Diagnosis Date  . Diabetes mellitus without complication   . Neuropathy, diabetic   . Hypertension   . Renal insufficiency     Past Surgical  History  Procedure Laterality Date  . Back surgery    . Broken leg    . Cervical disc surgery    . Tonsillectomy    . Appendectomy      History   Social History  . Marital Status: Married    Spouse Name: N/A    Number of Children: N/A  . Years of Education: N/A   Occupational History  . Not on file.   Social History Main Topics  . Smoking status: Former Smoker    Quit date: 12/22/1979  . Smokeless tobacco: Not on file  . Alcohol Use: 0.6 oz/week    1 Glasses of wine per week     Comment: wine  . Drug Use: Not on file  . Sexual Activity: Not on file   Other Topics Concern  . Not on file   Social History Narrative    ROS: no fevers or chills, productive cough, hemoptysis, dysphasia, odynophagia, melena, hematochezia, dysuria, hematuria, rash, seizure activity, orthopnea, PND, pedal edema, claudication. Remaining systems are negative.  Physical Exam: Well-developed well-nourished in no acute distress.  Skin is warm and dry.  HEENT is normal.  Neck is supple.  Chest is clear to auscultation with normal expansion.  Cardiovascular exam is regular rate and rhythm.  Abdominal exam nontender or distended. No masses palpated. Extremities show no edema. neuro grossly intact  ECG     This encounter was created in error - please disregard.

## 2014-07-03 ENCOUNTER — Encounter: Payer: Self-pay | Admitting: Cardiology

## 2014-07-03 ENCOUNTER — Ambulatory Visit (INDEPENDENT_AMBULATORY_CARE_PROVIDER_SITE_OTHER): Payer: Medicare Other | Admitting: Cardiology

## 2014-07-03 ENCOUNTER — Encounter: Payer: Medicare Other | Admitting: Cardiology

## 2014-07-03 VITALS — BP 148/88 | HR 89 | Ht 69.0 in | Wt 207.3 lb

## 2014-07-03 DIAGNOSIS — I350 Nonrheumatic aortic (valve) stenosis: Secondary | ICD-10-CM | POA: Diagnosis not present

## 2014-07-03 DIAGNOSIS — R072 Precordial pain: Secondary | ICD-10-CM

## 2014-07-03 DIAGNOSIS — I10 Essential (primary) hypertension: Secondary | ICD-10-CM | POA: Diagnosis not present

## 2014-07-03 DIAGNOSIS — E785 Hyperlipidemia, unspecified: Secondary | ICD-10-CM

## 2014-07-03 NOTE — Assessment & Plan Note (Signed)
No further symptoms. Nuclear study showed no ischemia.

## 2014-07-03 NOTE — Patient Instructions (Signed)
Your physician wants you to follow-up in: 6 MONTHS WITH DR CRENSHAW You will receive a reminder letter in the mail two months in advance. If you don't receive a letter, please call our office to schedule the follow-up appointment.  

## 2014-07-03 NOTE — Assessment & Plan Note (Signed)
Blood pressure mildly elevated. We will follow this and increase medications as needed. He states he typically is controlled.

## 2014-07-03 NOTE — Assessment & Plan Note (Signed)
Mild on echocardiogram.

## 2014-07-03 NOTE — Progress Notes (Signed)
HPI: Follow-up aortic stenosis. Recently seen for abnormal electrocardiogram. Echocardiogram November 2015 showed normal LV function, mild aortic stenosis with a mean gradient of 10 mmHg, mild mitral regurgitation and mild biatrial enlargement. Nuclear study 2015 showed an ejection fraction of 44%. Perfusion was normal. Since he was last seen there is no significant dyspnea, chest pain or syncope.  Current Outpatient Prescriptions  Medication Sig Dispense Refill  . aspirin EC 81 MG tablet Take 81 mg by mouth daily.    Marland Kitchen b complex vitamins tablet Take 1 tablet by mouth daily.    . calcium carbonate (OS-CAL) 600 MG TABS Take 600 mg by mouth daily with breakfast.     . Carboxymethylcellulose Sodium (REFRESH LIQUIGEL OP) Apply 1 drop to eye 4 (four) times daily.    Marland Kitchen doxycycline (VIBRA-TABS) 100 MG tablet Take 100 mg by mouth daily.     . DULoxetine (CYMBALTA) 30 MG capsule Take 30 mg by mouth daily. Take with a 60 to make 90    . DULoxetine (CYMBALTA) 60 MG capsule Take 60 mg by mouth daily. Take with 30 to make 90    . glyBURIDE-metformin (GLUCOVANCE) 2.5-500 MG per tablet Take 2 tablets by mouth daily with breakfast.    . hydrochlorothiazide (HYDRODIURIL) 25 MG tablet Take 25 mg by mouth daily.    . irbesartan (AVAPRO) 150 MG tablet Take 150 mg by mouth daily.    Marland Kitchen LORazepam (ATIVAN) 2 MG tablet Take 2 mg by mouth at bedtime.    . Magnesium Citrate 100 MG TABS Take 1 tablet by mouth daily.    . meloxicam (MOBIC) 15 MG tablet Take 15 mg by mouth daily.    . pramipexole (MIRAPEX) 0.5 MG tablet Take 0.5 mg by mouth 3 (three) times daily.    . pravastatin (PRAVACHOL) 40 MG tablet Take 40 mg by mouth daily.    Marland Kitchen Propylene Glycol (SYSTANE BALANCE) 0.6 % SOLN Apply 1 drop to eye 4 (four) times daily.    . tamsulosin (FLOMAX) 0.4 MG CAPS Take by mouth.    . vitamin C (ASCORBIC ACID) 500 MG tablet Take 500 mg by mouth daily.     No current facility-administered medications for this visit.      Past Medical History  Diagnosis Date  . Diabetes mellitus without complication   . Neuropathy, diabetic   . Hypertension   . Renal insufficiency     Past Surgical History  Procedure Laterality Date  . Back surgery    . Broken leg    . Cervical disc surgery    . Tonsillectomy    . Appendectomy      History   Social History  . Marital Status: Married    Spouse Name: N/A    Number of Children: N/A  . Years of Education: N/A   Occupational History  . Not on file.   Social History Main Topics  . Smoking status: Former Smoker    Quit date: 12/22/1979  . Smokeless tobacco: Not on file  . Alcohol Use: 0.6 oz/week    1 Glasses of wine per week     Comment: wine  . Drug Use: Not on file  . Sexual Activity: Not on file   Other Topics Concern  . Not on file   Social History Narrative    ROS: no fevers or chills, productive cough, hemoptysis, dysphasia, odynophagia, melena, hematochezia, dysuria, hematuria, rash, seizure activity, orthopnea, PND, pedal edema, claudication. Remaining systems are negative.  Physical  Exam: Well-developed well-nourished in no acute distress.  Skin is warm and dry.  HEENT is normal.  Neck is supple.  Chest is clear to auscultation with normal expansion.  Cardiovascular exam is regular rate and rhythm. 2/6 systolic murmur left sternal border. Abdominal exam nontender or distended. No masses palpated. Extremities show no edema. neuro grossly intact

## 2014-07-03 NOTE — Assessment & Plan Note (Signed)
Continue statin. 

## 2014-07-15 ENCOUNTER — Encounter (INDEPENDENT_AMBULATORY_CARE_PROVIDER_SITE_OTHER): Payer: Medicare Other | Admitting: Ophthalmology

## 2014-08-05 ENCOUNTER — Encounter (INDEPENDENT_AMBULATORY_CARE_PROVIDER_SITE_OTHER): Payer: Medicare Other | Admitting: Ophthalmology

## 2014-08-05 DIAGNOSIS — I10 Essential (primary) hypertension: Secondary | ICD-10-CM | POA: Diagnosis not present

## 2014-08-05 DIAGNOSIS — H43813 Vitreous degeneration, bilateral: Secondary | ICD-10-CM | POA: Diagnosis not present

## 2014-08-05 DIAGNOSIS — E11329 Type 2 diabetes mellitus with mild nonproliferative diabetic retinopathy without macular edema: Secondary | ICD-10-CM | POA: Diagnosis not present

## 2014-08-05 DIAGNOSIS — E11319 Type 2 diabetes mellitus with unspecified diabetic retinopathy without macular edema: Secondary | ICD-10-CM | POA: Diagnosis not present

## 2014-08-05 DIAGNOSIS — H35033 Hypertensive retinopathy, bilateral: Secondary | ICD-10-CM | POA: Diagnosis not present

## 2014-08-05 DIAGNOSIS — H3532 Exudative age-related macular degeneration: Secondary | ICD-10-CM

## 2014-08-05 DIAGNOSIS — H3531 Nonexudative age-related macular degeneration: Secondary | ICD-10-CM

## 2014-08-18 DIAGNOSIS — E118 Type 2 diabetes mellitus with unspecified complications: Secondary | ICD-10-CM | POA: Diagnosis not present

## 2014-08-18 DIAGNOSIS — E782 Mixed hyperlipidemia: Secondary | ICD-10-CM | POA: Diagnosis not present

## 2014-08-18 DIAGNOSIS — I10 Essential (primary) hypertension: Secondary | ICD-10-CM | POA: Diagnosis not present

## 2014-08-25 DIAGNOSIS — E1165 Type 2 diabetes mellitus with hyperglycemia: Secondary | ICD-10-CM | POA: Diagnosis not present

## 2014-08-25 DIAGNOSIS — E782 Mixed hyperlipidemia: Secondary | ICD-10-CM | POA: Diagnosis not present

## 2014-08-25 DIAGNOSIS — E1121 Type 2 diabetes mellitus with diabetic nephropathy: Secondary | ICD-10-CM | POA: Diagnosis not present

## 2014-08-25 DIAGNOSIS — I10 Essential (primary) hypertension: Secondary | ICD-10-CM | POA: Diagnosis not present

## 2014-10-21 ENCOUNTER — Encounter (INDEPENDENT_AMBULATORY_CARE_PROVIDER_SITE_OTHER): Payer: Medicare Other | Admitting: Ophthalmology

## 2014-10-21 DIAGNOSIS — I10 Essential (primary) hypertension: Secondary | ICD-10-CM

## 2014-10-21 DIAGNOSIS — H43813 Vitreous degeneration, bilateral: Secondary | ICD-10-CM

## 2014-10-21 DIAGNOSIS — H3532 Exudative age-related macular degeneration: Secondary | ICD-10-CM

## 2014-10-21 DIAGNOSIS — E11329 Type 2 diabetes mellitus with mild nonproliferative diabetic retinopathy without macular edema: Secondary | ICD-10-CM | POA: Diagnosis not present

## 2014-10-21 DIAGNOSIS — H3531 Nonexudative age-related macular degeneration: Secondary | ICD-10-CM | POA: Diagnosis not present

## 2014-10-21 DIAGNOSIS — H35033 Hypertensive retinopathy, bilateral: Secondary | ICD-10-CM | POA: Diagnosis not present

## 2014-10-21 DIAGNOSIS — E11319 Type 2 diabetes mellitus with unspecified diabetic retinopathy without macular edema: Secondary | ICD-10-CM

## 2014-11-27 DIAGNOSIS — Z791 Long term (current) use of non-steroidal anti-inflammatories (NSAID): Secondary | ICD-10-CM | POA: Insufficient documentation

## 2014-11-27 DIAGNOSIS — S92422B Displaced fracture of distal phalanx of left great toe, initial encounter for open fracture: Secondary | ICD-10-CM

## 2014-11-27 DIAGNOSIS — E785 Hyperlipidemia, unspecified: Secondary | ICD-10-CM | POA: Diagnosis present

## 2014-11-27 DIAGNOSIS — E1121 Type 2 diabetes mellitus with diabetic nephropathy: Secondary | ICD-10-CM | POA: Insufficient documentation

## 2014-11-27 DIAGNOSIS — Z79899 Other long term (current) drug therapy: Secondary | ICD-10-CM

## 2014-11-27 DIAGNOSIS — I639 Cerebral infarction, unspecified: Secondary | ICD-10-CM | POA: Diagnosis not present

## 2014-11-27 DIAGNOSIS — E1122 Type 2 diabetes mellitus with diabetic chronic kidney disease: Secondary | ICD-10-CM | POA: Diagnosis present

## 2014-11-27 DIAGNOSIS — Z23 Encounter for immunization: Secondary | ICD-10-CM

## 2014-11-27 DIAGNOSIS — S92912A Unspecified fracture of left toe(s), initial encounter for closed fracture: Secondary | ICD-10-CM | POA: Diagnosis present

## 2014-11-27 DIAGNOSIS — I129 Hypertensive chronic kidney disease with stage 1 through stage 4 chronic kidney disease, or unspecified chronic kidney disease: Secondary | ICD-10-CM | POA: Diagnosis present

## 2014-11-27 DIAGNOSIS — Z792 Long term (current) use of antibiotics: Secondary | ICD-10-CM | POA: Insufficient documentation

## 2014-11-27 DIAGNOSIS — W2203XA Walked into furniture, initial encounter: Secondary | ICD-10-CM

## 2014-11-27 DIAGNOSIS — Y9389 Activity, other specified: Secondary | ICD-10-CM | POA: Insufficient documentation

## 2014-11-27 DIAGNOSIS — Z87448 Personal history of other diseases of urinary system: Secondary | ICD-10-CM

## 2014-11-27 DIAGNOSIS — Y998 Other external cause status: Secondary | ICD-10-CM

## 2014-11-27 DIAGNOSIS — I6782 Cerebral ischemia: Secondary | ICD-10-CM | POA: Diagnosis not present

## 2014-11-27 DIAGNOSIS — Z87891 Personal history of nicotine dependence: Secondary | ICD-10-CM | POA: Insufficient documentation

## 2014-11-27 DIAGNOSIS — Y9289 Other specified places as the place of occurrence of the external cause: Secondary | ICD-10-CM

## 2014-11-27 DIAGNOSIS — W19XXXA Unspecified fall, initial encounter: Secondary | ICD-10-CM | POA: Diagnosis present

## 2014-11-27 DIAGNOSIS — S91112A Laceration without foreign body of left great toe without damage to nail, initial encounter: Secondary | ICD-10-CM

## 2014-11-27 DIAGNOSIS — E1140 Type 2 diabetes mellitus with diabetic neuropathy, unspecified: Secondary | ICD-10-CM | POA: Diagnosis not present

## 2014-11-27 DIAGNOSIS — Z7982 Long term (current) use of aspirin: Secondary | ICD-10-CM

## 2014-11-27 DIAGNOSIS — I6350 Cerebral infarction due to unspecified occlusion or stenosis of unspecified cerebral artery: Secondary | ICD-10-CM | POA: Diagnosis not present

## 2014-11-27 DIAGNOSIS — N183 Chronic kidney disease, stage 3 (moderate): Secondary | ICD-10-CM | POA: Diagnosis present

## 2014-11-27 DIAGNOSIS — I350 Nonrheumatic aortic (valve) stenosis: Secondary | ICD-10-CM | POA: Diagnosis present

## 2014-11-27 DIAGNOSIS — I10 Essential (primary) hypertension: Secondary | ICD-10-CM | POA: Insufficient documentation

## 2014-11-27 DIAGNOSIS — S92422A Displaced fracture of distal phalanx of left great toe, initial encounter for closed fracture: Secondary | ICD-10-CM | POA: Diagnosis not present

## 2014-11-27 DIAGNOSIS — G319 Degenerative disease of nervous system, unspecified: Secondary | ICD-10-CM | POA: Diagnosis not present

## 2014-11-27 DIAGNOSIS — R4781 Slurred speech: Secondary | ICD-10-CM | POA: Diagnosis not present

## 2014-11-27 DIAGNOSIS — S91312A Laceration without foreign body, left foot, initial encounter: Secondary | ICD-10-CM | POA: Diagnosis present

## 2014-11-27 DIAGNOSIS — R000 Tachycardia, unspecified: Secondary | ICD-10-CM | POA: Diagnosis not present

## 2014-11-28 ENCOUNTER — Inpatient Hospital Stay (HOSPITAL_COMMUNITY)
Admission: EM | Admit: 2014-11-28 | Discharge: 2014-11-30 | DRG: 066 | Disposition: A | Discharge status: Home Health | Payer: Medicare Other | Attending: Internal Medicine | Admitting: Internal Medicine

## 2014-11-28 ENCOUNTER — Emergency Department (HOSPITAL_COMMUNITY): Payer: Medicare Other

## 2014-11-28 ENCOUNTER — Emergency Department (HOSPITAL_COMMUNITY)
Admission: EM | Admit: 2014-11-28 | Discharge: 2014-11-28 | Disposition: A | Discharge status: Home / Self Care | Payer: Medicare Other | Source: Home / Self Care | Attending: Emergency Medicine | Admitting: Emergency Medicine

## 2014-11-28 ENCOUNTER — Encounter (HOSPITAL_COMMUNITY): Payer: Self-pay | Admitting: Emergency Medicine

## 2014-11-28 ENCOUNTER — Inpatient Hospital Stay (HOSPITAL_COMMUNITY): Payer: Medicare Other

## 2014-11-28 DIAGNOSIS — S92422A Displaced fracture of distal phalanx of left great toe, initial encounter for closed fracture: Secondary | ICD-10-CM | POA: Diagnosis not present

## 2014-11-28 DIAGNOSIS — S91312A Laceration without foreign body, left foot, initial encounter: Secondary | ICD-10-CM | POA: Diagnosis present

## 2014-11-28 DIAGNOSIS — S92912D Unspecified fracture of left toe(s), subsequent encounter for fracture with routine healing: Secondary | ICD-10-CM | POA: Diagnosis not present

## 2014-11-28 DIAGNOSIS — E1122 Type 2 diabetes mellitus with diabetic chronic kidney disease: Secondary | ICD-10-CM | POA: Diagnosis present

## 2014-11-28 DIAGNOSIS — Z87891 Personal history of nicotine dependence: Secondary | ICD-10-CM | POA: Diagnosis not present

## 2014-11-28 DIAGNOSIS — S92912A Unspecified fracture of left toe(s), initial encounter for closed fracture: Secondary | ICD-10-CM | POA: Diagnosis present

## 2014-11-28 DIAGNOSIS — N182 Chronic kidney disease, stage 2 (mild): Secondary | ICD-10-CM | POA: Diagnosis present

## 2014-11-28 DIAGNOSIS — J9809 Other diseases of bronchus, not elsewhere classified: Secondary | ICD-10-CM | POA: Diagnosis not present

## 2014-11-28 DIAGNOSIS — I639 Cerebral infarction, unspecified: Secondary | ICD-10-CM | POA: Diagnosis present

## 2014-11-28 DIAGNOSIS — I6350 Cerebral infarction due to unspecified occlusion or stenosis of unspecified cerebral artery: Secondary | ICD-10-CM | POA: Diagnosis not present

## 2014-11-28 DIAGNOSIS — J9811 Atelectasis: Secondary | ICD-10-CM | POA: Diagnosis not present

## 2014-11-28 DIAGNOSIS — E1129 Type 2 diabetes mellitus with other diabetic kidney complication: Secondary | ICD-10-CM | POA: Diagnosis not present

## 2014-11-28 DIAGNOSIS — Z7982 Long term (current) use of aspirin: Secondary | ICD-10-CM | POA: Diagnosis not present

## 2014-11-28 DIAGNOSIS — I350 Nonrheumatic aortic (valve) stenosis: Secondary | ICD-10-CM | POA: Diagnosis present

## 2014-11-28 DIAGNOSIS — N058 Unspecified nephritic syndrome with other morphologic changes: Secondary | ICD-10-CM

## 2014-11-28 DIAGNOSIS — I129 Hypertensive chronic kidney disease with stage 1 through stage 4 chronic kidney disease, or unspecified chronic kidney disease: Secondary | ICD-10-CM | POA: Diagnosis present

## 2014-11-28 DIAGNOSIS — W19XXXA Unspecified fall, initial encounter: Secondary | ICD-10-CM | POA: Diagnosis not present

## 2014-11-28 DIAGNOSIS — I6782 Cerebral ischemia: Secondary | ICD-10-CM | POA: Diagnosis not present

## 2014-11-28 DIAGNOSIS — I6330 Cerebral infarction due to thrombosis of unspecified cerebral artery: Secondary | ICD-10-CM

## 2014-11-28 DIAGNOSIS — E119 Type 2 diabetes mellitus without complications: Secondary | ICD-10-CM

## 2014-11-28 DIAGNOSIS — N183 Chronic kidney disease, stage 3 (moderate): Secondary | ICD-10-CM | POA: Diagnosis present

## 2014-11-28 DIAGNOSIS — G319 Degenerative disease of nervous system, unspecified: Secondary | ICD-10-CM | POA: Diagnosis not present

## 2014-11-28 DIAGNOSIS — I10 Essential (primary) hypertension: Secondary | ICD-10-CM | POA: Diagnosis present

## 2014-11-28 DIAGNOSIS — E785 Hyperlipidemia, unspecified: Secondary | ICD-10-CM | POA: Diagnosis present

## 2014-11-28 DIAGNOSIS — R4781 Slurred speech: Secondary | ICD-10-CM | POA: Diagnosis not present

## 2014-11-28 DIAGNOSIS — R000 Tachycardia, unspecified: Secondary | ICD-10-CM | POA: Diagnosis not present

## 2014-11-28 DIAGNOSIS — E1140 Type 2 diabetes mellitus with diabetic neuropathy, unspecified: Secondary | ICD-10-CM | POA: Diagnosis present

## 2014-11-28 DIAGNOSIS — Z79899 Other long term (current) drug therapy: Secondary | ICD-10-CM | POA: Diagnosis not present

## 2014-11-28 DIAGNOSIS — S92912G Unspecified fracture of left toe(s), subsequent encounter for fracture with delayed healing: Secondary | ICD-10-CM | POA: Diagnosis not present

## 2014-11-28 DIAGNOSIS — S92912B Unspecified fracture of left toe(s), initial encounter for open fracture: Secondary | ICD-10-CM

## 2014-11-28 HISTORY — DX: Cerebral infarction, unspecified: I63.9

## 2014-11-28 LAB — APTT: APTT: 31 s (ref 24–37)

## 2014-11-28 LAB — RAPID URINE DRUG SCREEN, HOSP PERFORMED
Amphetamines: NOT DETECTED
Barbiturates: NOT DETECTED
Benzodiazepines: POSITIVE — AB
Cocaine: NOT DETECTED
Opiates: POSITIVE — AB
TETRAHYDROCANNABINOL: NOT DETECTED

## 2014-11-28 LAB — COMPREHENSIVE METABOLIC PANEL
ALT: 23 U/L (ref 0–53)
AST: 24 U/L (ref 0–37)
Albumin: 3.8 g/dL (ref 3.5–5.2)
Alkaline Phosphatase: 69 U/L (ref 39–117)
Anion gap: 8 (ref 5–15)
BUN: 43 mg/dL — AB (ref 6–23)
CALCIUM: 9.9 mg/dL (ref 8.4–10.5)
CO2: 28 mmol/L (ref 19–32)
Chloride: 103 mmol/L (ref 96–112)
Creatinine, Ser: 1.43 mg/dL — ABNORMAL HIGH (ref 0.50–1.35)
GFR calc non Af Amer: 42 mL/min — ABNORMAL LOW (ref >90)
GFR, EST AFRICAN AMERICAN: 49 mL/min — AB (ref >90)
GLUCOSE: 86 mg/dL (ref 70–99)
Potassium: 5 mmol/L (ref 3.5–5.1)
Sodium: 139 mmol/L (ref 135–145)
TOTAL PROTEIN: 6.6 g/dL (ref 6.0–8.3)
Total Bilirubin: 0.7 mg/dL (ref 0.3–1.2)

## 2014-11-28 LAB — CBG MONITORING, ED: GLUCOSE-CAPILLARY: 84 mg/dL (ref 70–99)

## 2014-11-28 LAB — ETHANOL: Alcohol, Ethyl (B): <5 mg/dL (ref 0–9)

## 2014-11-28 LAB — URINALYSIS, ROUTINE W REFLEX MICROSCOPIC
Bilirubin Urine: NEGATIVE
Glucose, UA: NEGATIVE mg/dL
Hgb urine dipstick: NEGATIVE
KETONES UR: NEGATIVE mg/dL
LEUKOCYTES UA: NEGATIVE
Nitrite: NEGATIVE
PH: 6 (ref 5.0–8.0)
PROTEIN: NEGATIVE mg/dL
Specific Gravity, Urine: 1.013 (ref 1.005–1.030)
UROBILINOGEN UA: 0.2 mg/dL (ref 0.0–1.0)

## 2014-11-28 LAB — I-STAT CHEM 8, ED
BUN: 45 mg/dL — ABNORMAL HIGH (ref 6–23)
Calcium, Ion: 1.26 mmol/L (ref 1.13–1.30)
Chloride: 101 mmol/L (ref 96–112)
Creatinine, Ser: 1.7 mg/dL — ABNORMAL HIGH (ref 0.50–1.35)
Glucose, Bld: 81 mg/dL (ref 70–99)
HCT: 34 % — ABNORMAL LOW (ref 39.0–52.0)
HEMOGLOBIN: 11.6 g/dL — AB (ref 13.0–17.0)
Potassium: 4.9 mmol/L (ref 3.5–5.1)
SODIUM: 136 mmol/L (ref 135–145)
TCO2: 23 mmol/L (ref 0–100)

## 2014-11-28 LAB — CBC
HCT: 34.3 % — ABNORMAL LOW (ref 39.0–52.0)
HEMOGLOBIN: 11.1 g/dL — AB (ref 13.0–17.0)
MCH: 30.8 pg (ref 26.0–34.0)
MCHC: 32.4 g/dL (ref 30.0–36.0)
MCV: 95.3 fL (ref 78.0–100.0)
Platelets: 207 10*3/uL (ref 150–400)
RBC: 3.6 MIL/uL — AB (ref 4.22–5.81)
RDW: 12.7 % (ref 11.5–15.5)
WBC: 7.3 10*3/uL (ref 4.0–10.5)

## 2014-11-28 LAB — GLUCOSE, CAPILLARY
GLUCOSE-CAPILLARY: 50 mg/dL — AB (ref 70–99)
Glucose-Capillary: 131 mg/dL — ABNORMAL HIGH (ref 70–99)

## 2014-11-28 LAB — I-STAT TROPONIN, ED: TROPONIN I, POC: 0 ng/mL (ref 0.00–0.08)

## 2014-11-28 LAB — PROTIME-INR
INR: 0.9 (ref 0.00–1.49)
PROTHROMBIN TIME: 12.2 s (ref 11.6–15.2)

## 2014-11-28 LAB — DIFFERENTIAL
BASOS PCT: 0 % (ref 0–1)
Basophils Absolute: 0 10*3/uL (ref 0.0–0.1)
Eosinophils Absolute: 0.3 10*3/uL (ref 0.0–0.7)
Eosinophils Relative: 4 % (ref 0–5)
Lymphocytes Relative: 22 % (ref 12–46)
Lymphs Abs: 1.6 10*3/uL (ref 0.7–4.0)
MONOS PCT: 10 % (ref 3–12)
Monocytes Absolute: 0.7 10*3/uL (ref 0.1–1.0)
NEUTROS PCT: 65 % (ref 43–77)
Neutro Abs: 4.7 10*3/uL (ref 1.7–7.7)

## 2014-11-28 MED ORDER — TETANUS-DIPHTH-ACELL PERTUSSIS 5-2.5-18.5 LF-MCG/0.5 IM SUSP
0.5000 mL | Freq: Once | INTRAMUSCULAR | Status: AC
  Administered 2014-11-28: 0.5 mL via INTRAMUSCULAR
  Filled 2014-11-28: qty 0.5

## 2014-11-28 MED ORDER — ACETAMINOPHEN 325 MG PO TABS: 650.0000 mg | ORAL_TABLET | ORAL | Status: DC | PRN

## 2014-11-28 MED ORDER — PRAMIPEXOLE DIHYDROCHLORIDE 0.125 MG PO TABS
0.5000 mg | ORAL_TABLET | Freq: Every day | ORAL | Status: DC
  Administered 2014-11-28 – 2014-11-29 (×2): 0.5 mg via ORAL
  Filled 2014-11-28 (×2): qty 4

## 2014-11-28 MED ORDER — INSULIN ASPART 100 UNIT/ML ~~LOC~~ SOLN
0.0000 [IU] | SUBCUTANEOUS | Status: DC
  Administered 2014-11-29: 2 [IU] via SUBCUTANEOUS

## 2014-11-28 MED ORDER — DULOXETINE HCL 30 MG PO CPEP
90.0000 mg | ORAL_CAPSULE | Freq: Every day | ORAL | Status: DC
  Administered 2014-11-29 – 2014-11-30 (×2): 90 mg via ORAL
  Filled 2014-11-28 (×3): qty 3

## 2014-11-28 MED ORDER — PRAVASTATIN SODIUM 40 MG PO TABS
40.0000 mg | ORAL_TABLET | Freq: Every day | ORAL | Status: DC
Start: 2014-11-29 — End: 2014-11-30
  Administered 2014-11-29 – 2014-11-30 (×2): 40 mg via ORAL
  Filled 2014-11-28 (×2): qty 1

## 2014-11-28 MED ORDER — CEPHALEXIN 500 MG PO CAPS: 500.0000 mg | ORAL_CAPSULE | Freq: Two times a day (BID) | ORAL | Status: DC

## 2014-11-28 MED ORDER — ASPIRIN 300 MG RE SUPP: 300.0000 mg | Freq: Every day | RECTAL | Status: DC

## 2014-11-28 MED ORDER — TAMSULOSIN HCL 0.4 MG PO CAPS
0.4000 mg | ORAL_CAPSULE | Freq: Every day | ORAL | Status: DC
  Administered 2014-11-29: 0.4 mg via ORAL
  Filled 2014-11-28: qty 1

## 2014-11-28 MED ORDER — DULOXETINE HCL 60 MG PO CPEP: 60.0000 mg | ORAL_CAPSULE | Freq: Every day | ORAL | Status: DC

## 2014-11-28 MED ORDER — DOXYCYCLINE HYCLATE 100 MG PO TABS
100.0000 mg | ORAL_TABLET | Freq: Every day | ORAL | Status: DC
  Administered 2014-11-29 – 2014-11-30 (×2): 100 mg via ORAL
  Filled 2014-11-28 (×2): qty 1

## 2014-11-28 MED ORDER — LORAZEPAM 1 MG PO TABS: 2.0000 mg | ORAL_TABLET | Freq: Every evening | ORAL | Status: DC | PRN

## 2014-11-28 MED ORDER — CYCLOBENZAPRINE HCL 10 MG PO TABS
10.0000 mg | ORAL_TABLET | Freq: Every day | ORAL | Status: DC
Start: 2014-11-28 — End: 2014-11-30
  Administered 2014-11-28 – 2014-11-29 (×2): 10 mg via ORAL
  Filled 2014-11-28 (×2): qty 1

## 2014-11-28 MED ORDER — STROKE: EARLY STAGES OF RECOVERY BOOK
Freq: Once | Status: AC
  Administered 2014-11-28

## 2014-11-28 MED ORDER — CEPHALEXIN 500 MG PO CAPS
500.0000 mg | ORAL_CAPSULE | Freq: Two times a day (BID) | ORAL | Status: DC
  Administered 2014-11-28 – 2014-11-30 (×4): 500 mg via ORAL
  Filled 2014-11-28 (×4): qty 1

## 2014-11-28 MED ORDER — LIDOCAINE-EPINEPHRINE 1 %-1:100000 IJ SOLN
10.0000 mL | Freq: Once | INTRAMUSCULAR | Status: AC
  Administered 2014-11-28: 10 mL
  Filled 2014-11-28: qty 1

## 2014-11-28 MED ORDER — CEPHALEXIN 500 MG PO CAPS
500.0000 mg | ORAL_CAPSULE | Freq: Once | ORAL | Status: AC
  Administered 2014-11-28: 500 mg via ORAL
  Filled 2014-11-28: qty 1

## 2014-11-28 MED ORDER — SODIUM CHLORIDE 0.9 % IV SOLN
INTRAVENOUS | Status: DC
  Administered 2014-11-28: via INTRAVENOUS

## 2014-11-28 MED ORDER — ASPIRIN 325 MG PO TABS
325.0000 mg | ORAL_TABLET | Freq: Every day | ORAL | Status: DC
  Administered 2014-11-29 – 2014-11-30 (×2): 325 mg via ORAL
  Filled 2014-11-28 (×2): qty 1

## 2014-11-28 MED ORDER — SENNOSIDES-DOCUSATE SODIUM 8.6-50 MG PO TABS: 1.0000 | ORAL_TABLET | Freq: Every evening | ORAL | Status: DC | PRN

## 2014-11-28 MED ORDER — SODIUM CHLORIDE 0.9 % IV BOLUS (SEPSIS)
500.0000 mL | Freq: Once | INTRAVENOUS | Status: AC
  Administered 2014-11-28: 500 mL via INTRAVENOUS

## 2014-11-28 MED ORDER — ACETAMINOPHEN 650 MG RE SUPP: 650.0000 mg | RECTAL | Status: DC | PRN

## 2014-11-28 NOTE — ED Notes (Signed)
Pt from home. Per wife, she sts that she noticed that pt had slight droop to R side of mouth yesterday approximately around 4pm. Wife reports that she thought that it may be the "history of [place]." Wife reports that pt was here last night for foot injury but takes so many medications at night, that the slurred speech was from meds. Wife sts that she decided to being him back since pt has weakness in his legs. Pt is A&O and in NAD.

## 2014-11-28 NOTE — ED Notes (Signed)
Report given to nursing staff at [facility] questions answered by this nurse [facility] called and made aware of need to transport patient to [facility]

## 2014-11-28 NOTE — Discharge Instructions (Signed)
Toe Fracture Kristopher Zimmerman, Your xray shows a fracture.  See orthopaedic surgery within 3 days for close follow up.  Take antibiotics as prescribed.  Use tylenol or any pain and keep the area well protected.  If any symptoms worsen, come back to the ED immediately. Thank you. A toe fracture is a break in the bone of a toe. It may take 6 to 8 weeks for the toe injury to heal. HOME CARE  "Buddy taping" is taping the broken toe to the toe next to it. Leave the toes taped together for at least 1 week or as told by your doctor. Change the tape after bathing. Always use a small piece of gauze or cotton between the toes when taping them together.  Put ice on the injured area.  Put ice in a plastic bag.  Place a towel between your skin and the bag.  Leave the ice on for 15-20 minutes, 03-04 times a day.  After the first 2 days, put heat on the injured area. Use heat for the next 2 to 3 days. Put a heating pad on the foot or soak the foot in warm water as told by your doctor.  Keep the foot raised (elevated) above the level of your heart.  Wear sturdy, supportive shoes. The shoes should not pinch the toes or fit tightly against the toes.  Use a cast shoe (if prescribed) if the foot is very puffy (swollen).  Use crutches if you have pain or it hurts too much to walk.  Only take medicine as told by your doctor.  Follow up with your doctor as told. GET HELP RIGHT AWAY IF:   There is pain or puffiness that is not helped by medicine.  The pain does not get better after 1 week.  The toe is cold when the others are warm.  The toe loses feeling (numb) or turns white.  The toe becomes hot and red (inflamed). MAKE SURE YOU:   Understand these instructions.  Will watch this condition.  Will get help right away if you are not doing well or get worse. Document Released: 01/03/2008 Document Revised: 10/09/2011 Document Reviewed: 12/10/2009 [facility] Patient Information 2015 [place], [place]. This  information is not intended to replace advice given to you by your health care provider. Make sure you discuss any questions you have with your health care provider.

## 2014-11-28 NOTE — ED Provider Notes (Signed)
CSN: [id number]     Arrival date & time 11/28/14  1500 History   First MD Initiated Contact with Patient 11/28/14 1506     Chief Complaint  Patient presents with  . Aphasia   Patient is a 81 y.o. male presenting with weakness. The history is provided by the patient and a relative.  Weakness This is a new problem. The current episode started yesterday (Pt has been having some issues with falling this past week.  they attributed it to his neuropathy.  Early this monring he had another fall.  Pt was somewhat groggy and speech was slurred but they thought it was because it was late.). Episode frequency: They did not mention any of the issues early this am when they were seen for a toe injury when the patient stumbled and injured his toe.  Family also noticed drooping on the right side of his face and slurring of his words when he woke up this afternoon. The problem has been gradually worsening. Pertinent negatives include no chest pain, no abdominal pain, no headaches and no shortness of breath. Nothing aggravates the symptoms. Nothing relieves the symptoms. He has tried nothing for the symptoms. The treatment provided no relief.    Past Medical History  Diagnosis Date  . Diabetes mellitus without complication   . Neuropathy, diabetic   . Hypertension   . Renal insufficiency    Past Surgical History  Procedure Laterality Date  . Back surgery    . Broken leg    . Cervical disc surgery    . Tonsillectomy    . Appendectomy     Family History  Problem Relation Age of Onset  . Cirrhosis Father   . Diabetes Brother   . Cancer - Lung Sister   . Alzheimer's disease Sister   . Heart disease Sister   . Cancer - Lung Brother    History  Substance Use Topics  . Smoking status: Former Smoker    Quit date: 12/22/1979  . Smokeless tobacco: Not on file  . Alcohol Use: 0.6 oz/week    1 Glasses of wine per week     Comment: wine    Review of Systems  Constitutional: Negative for fever and  diaphoresis.  Respiratory: Negative for shortness of breath.   Cardiovascular: Negative for chest pain.  Gastrointestinal: Negative for abdominal pain.  Genitourinary: Negative for dysuria.  Neurological: Positive for speech difficulty and weakness. Negative for numbness and headaches.  All other systems reviewed and are negative.     Allergies  Review of patient's allergies indicates no known allergies.  Home Medications   Prior to Admission medications   Medication Sig Start Date End Date Taking? Authorizing Provider  aspirin EC 81 MG tablet Take 81 mg by mouth daily.   Yes Historical Provider, MD  B Complex-C (B-COMPLEX WITH VITAMIN C) tablet Take 1 tablet by mouth daily.   Yes Historical Provider, MD  calcium carbonate (OS-CAL) 600 MG TABS Take 600 mg by mouth daily with breakfast.    Yes Historical Provider, MD  Carboxymethylcellulose Sodium (REFRESH LIQUIGEL OP) Apply 1 drop to eye 4 (four) times daily.   Yes Historical Provider, MD  cephALEXin (KEFLEX) 500 MG capsule Take 1 capsule (500 mg total) by mouth 2 (two) times daily. 11/28/14  Yes Everlene Balls, MD  cyclobenzaprine (FLEXERIL) 10 MG tablet Take 10 mg by mouth at bedtime.   Yes Historical Provider, MD  doxycycline (VIBRA-TABS) 100 MG tablet Take 100 mg by mouth daily.  Yes Historical Provider, MD  DULoxetine (CYMBALTA) 30 MG capsule Take 30 mg by mouth daily. Take with a 60 to make 90   Yes Historical Provider, MD  DULoxetine (CYMBALTA) 60 MG capsule Take 60 mg by mouth daily. Take with 30 to make 90   Yes Historical Provider, MD  glyBURIDE-metformin (GLUCOVANCE) 2.5-500 MG per tablet Take 2 tablets by mouth daily with breakfast.   Yes Historical Provider, MD  hydrochlorothiazide (HYDRODIURIL) 25 MG tablet Take 25 mg by mouth daily.   Yes Historical Provider, MD  irbesartan (AVAPRO) 150 MG tablet Take 150 mg by mouth daily.   Yes Historical Provider, MD  LORazepam (ATIVAN) 2 MG tablet Take 2 mg by mouth at bedtime.   Yes  Historical Provider, MD  Magnesium Citrate 100 MG TABS Take 1 tablet by mouth daily.   Yes Historical Provider, MD  meloxicam (MOBIC) 15 MG tablet Take 15 mg by mouth daily.   Yes Historical Provider, MD  pramipexole (MIRAPEX) 0.5 MG tablet Take 0.5 mg by mouth 3 (three) times daily.   Yes Historical Provider, MD  pravastatin (PRAVACHOL) 40 MG tablet Take 40 mg by mouth daily.   Yes Historical Provider, MD  Propylene Glycol (SYSTANE BALANCE) 0.6 % SOLN Apply 1 drop to eye 4 (four) times daily.   Yes Historical Provider, MD  tamsulosin (FLOMAX) 0.4 MG CAPS Take by mouth.   Yes Historical Provider, MD  vitamin C (ASCORBIC ACID) 500 MG tablet Take 500 mg by mouth daily.   Yes Historical Provider, MD   BP 175/74 mmHg  Pulse 89  Temp(Src) 97.3 F (36.3 C)  Resp 19  SpO2 98% Physical Exam  Constitutional: He is oriented to person, place, and time. He appears well-developed and well-nourished. No distress.  HENT:  Head: Normocephalic and atraumatic.  Right Ear: External ear normal.  Left Ear: External ear normal.  Mouth/Throat: Oropharynx is clear and moist.  Eyes: Conjunctivae are normal. Right eye exhibits no discharge. Left eye exhibits no discharge. No scleral icterus.  Neck: Neck supple. No tracheal deviation present.  Cardiovascular: Normal rate, regular rhythm and intact distal pulses.   Pulmonary/Chest: Effort normal and breath sounds normal. No stridor. No respiratory distress. He has no wheezes. He has no rales.  Abdominal: Soft. Bowel sounds are normal. He exhibits no distension. There is no tenderness. There is no rebound and no guarding.  Musculoskeletal: He exhibits no edema or tenderness.  Neurological: He is alert and oriented to person, place, and time. He has normal strength. A cranial nerve deficit ( r facial droop, extraocular movements intact, no slurred speech ) is present. No sensory deficit. He exhibits normal muscle tone. He displays no seizure activity. Coordination  normal.  No pronator drift bilateral upper extrem, able to hold both legs off bed for 5 seconds, sensation intact in all extremities, no visual field cuts, no left or right sided neglect, normal finger-nose exam bilaterally, no nystagmus noted  NIHSS =1 for facial droop right side   Skin: Skin is warm and dry. No rash noted.  Psychiatric: He has a normal mood and affect.  Nursing note and vitals reviewed.   ED Course  Procedures (including critical care time) Labs Review Labs Reviewed  CBC - Abnormal; Notable for the following:    RBC 3.60 (*)    Hemoglobin 11.1 (*)    HCT 34.3 (*)    All other components within normal limits  COMPREHENSIVE METABOLIC PANEL - Abnormal; Notable for the following:  BUN 43 (*)    Creatinine, Ser 1.43 (*)    GFR calc non Af Amer 42 (*)    GFR calc Af Amer 49 (*)    All other components within normal limits  URINE RAPID DRUG SCREEN (HOSP PERFORMED) - Abnormal; Notable for the following:    Opiates POSITIVE (*)    Benzodiazepines POSITIVE (*)    All other components within normal limits  I-STAT CHEM 8, ED - Abnormal; Notable for the following:    BUN 45 (*)    Creatinine, Ser 1.70 (*)    Hemoglobin 11.6 (*)    HCT 34.0 (*)    All other components within normal limits  ETHANOL  PROTIME-INR  APTT  DIFFERENTIAL  URINALYSIS, ROUTINE W REFLEX MICROSCOPIC  I-STAT TROPOININ, ED  CBG MONITORING, ED    Imaging Review Ct Head Wo Contrast  11/28/2014   CLINICAL DATA:  One day history of slurred speech  EXAM: CT HEAD WITHOUT CONTRAST  TECHNIQUE: Contiguous axial images were obtained from the base of the skull through the vertex without intravenous contrast.  COMPARISON:  Head CT Dec 21, 2012 and brain MRI January 03, 2013  FINDINGS: There is mild diffuse atrophy. There is no intracranial mass, hemorrhage, extra-axial fluid collection, or midline shift. There is patchy small vessel disease in the centra semiovale bilaterally. There are no new gray-white  compartment lesions. No acute infarct apparent. Bony calvarium appears intact. The mastoid air cells are clear. Note that the mastoids on the right are somewhat hypoplastic.  IMPRESSION: Atrophy with periventricular small vessel disease. No intracranial mass, hemorrhage, or acute appearing infarct.   Electronically Signed   By: Lowella Grip III M.D.   On: 11/28/2014 15:47   Mr Brain Wo Contrast  11/28/2014   CLINICAL DATA:  Right-sided facial droop beginning yesterday. Slurred speech.  EXAM: MRI HEAD WITHOUT CONTRAST  TECHNIQUE: Multiplanar, multiecho pulse sequences of the brain and surrounding structures were obtained without intravenous contrast.  COMPARISON:  Head CT same day.  MRI 01/03/2013.  FINDINGS: There is a sub cm region of acute infarction in the right basal ganglia and corona radiata. No other acute infarction.  The brainstem and cerebellum are unremarkable. There chronic small-vessel ischemic changes affecting the cerebral deep and subcortical white matter. No large vessel territory infarction. No mass lesion, hemorrhage, hydrocephalus or extra-axial collection. No pituitary mass. No inflammatory sinus disease. No skull or skullbase lesion.  IMPRESSION: Acute sub cm infarction affecting left basal ganglia/ corona radiata. No swelling or hemorrhage.  Chronic small vessel ischemic changes elsewhere throughout the brain.   Electronically Signed   By: Nelson Chimes M.D.   On: 11/28/2014 19:03   Dg Toe Great Left  11/28/2014   CLINICAL DATA:  Initial encounter for stubbed toe at home today. Bleeding.  EXAM: LEFT GREAT TOE  COMPARISON:  None.  FINDINGS: Soft tissue bandage obscures detail. Fracture involving the dorsalaspect of the proximal most portion of the distal phalanx of the great toe. Intra-articular extension. This has both medial and lateral components.  Artifactual lucency through the proximal phalanx of the second digit.  IMPRESSION: Intra-articular fracture involving the proximal  portion of the distal phalanx of the first digit.   Electronically Signed   By: Abigail Miyamoto M.D.   On: 11/28/2014 02:24     EKG Interpretation   Date/Time:  Saturday November 28 2014 15:07:04 EDT Ventricular Rate:  104 PR Interval:  184 QRS Duration: 91 QT Interval:  326 QTC Calculation: 429  R Axis:   33 Text Interpretation:  Sinus tachycardia Probable inferior infarct, old  inferior st elevation present on prior ECG Confirmed by Twania Bujak  MD-J, Shandie Bertz  [id number]) on 11/28/2014 4:24:36 PM      MDM   Final diagnoses:  Stroke    Pt symptoms concerning for the possibility of stroke.  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  Pt would prefer not to be admitted.  Will try to get MRI today  1918  MRI results show an acute infarction left basal ganglia.  Pt remains otherwise stable. Discussed findings with patient and family.  Will consult with medical service for admission and further treatment.  Pt not a tpa candidate.  Onset of symptoms was yesterday. >12 hours ago   Dorie Rank, MD 11/28/14 1919

## 2014-11-28 NOTE — Progress Notes (Signed)
Patient left for X-ray 

## 2014-11-28 NOTE — ED Notes (Signed)
Hospitalist at bedside 

## 2014-11-28 NOTE — ED Notes (Signed)
Patient and patient's wife aware that plan of care at this time is to transfer to [facility]

## 2014-11-28 NOTE — Progress Notes (Signed)
[facility] hospitalist progress note. Chief complaint. Transfer note. History of present illness. This 80 year old male presented to [facility] with slurred speech and facial droop. Patient admitted under the TIA/CVA protocol. Neurology was contacted and requested transfer to [facility]. The patient has arrived in transfer and I'm seeing the patient at bedside to ensure he remained clinically stable and that his orders transferred here appropriately. Patient has no current complaints. Physical exam. Temperature 97.7, pulse 96, respiration 16, blood pressure 207/89. O2 sats 98%. General appearance. Well-developed male who is alert and in no distress. Cardiac. Rate and rhythm regular. Lungs. Breath sounds clear and equal. Abdomen. Soft with positive bowel sounds. Neurologic. Cranial nerves II-12 grossly intact. Speech is clear. No unilateral or focal defects. Impression/plan. Problem #1. CVA. Patient admitted under the TIA/CVA protocol. Obtain carotid Doppler and cardiac echo, follow cardiac enzymes, lipid panel, TSH. Physical and occupational therapy evaluations. Neurology has been updated on patient's arrival. Problem #2. Hypertension. Permissive hypertension in setting of acute CVA. Problem #3. Chronic kidney disease. Slightly worsened renal function. Gentle IV hydration. Problem #4. Neuropathy from diabetes. Patient with a fractured left toe with neurology following. Problem #5. Diabetes. Sliding scale until cleared for diet. Patient appears clinically stable post transfer. All orders appear to of transferred appropriately.

## 2014-11-28 NOTE — ED Notes (Signed)
Patient transported to MRI 

## 2014-11-28 NOTE — ED Notes (Signed)
Pt from home c/o left great toe injury in which he stubbed his toe. Blood noted to sock. HX of Diabetes and neuropathy.

## 2014-11-28 NOTE — H&P (Signed)
PCP:  Merrilee Seashore, MD  Cardiology [facility]  Referring provider Dorie Rank   Chief Complaint: Slurred speech and facial droop  HPI: Kristopher Zimmerman is a 81 y.o. male   has a past medical history of Diabetes mellitus without complication; Neuropathy, diabetic; Hypertension; and Renal insufficiency.   Presented with fall night of the 30th for which she was evaluated in emergency department due to toe injury was found to be fractured he was discharged with fracture boot.   Per family he may have been unstable since then. Today he was brought back to emergency department at 3 PM due to continued falls and was noted that was noted to have worsening slurred speech as well as right facial droop. Family actually thinks that the facial droop happened on the 29th around 4 PM. Wife states he has Hx of Bell's Plasy and she thought the slurred speech and facial droop was due to that.  Family brought him in to progressive generalized weakness. Otherwise patient denies any   nausea no vomiting no diarrhea no shortness of breath. In emergency department he had a CT scan of the head done which showed no acute findings. MRI of the brain was done showing acute subcentimeter infarction of the left basal ganglia and corona radiata small small vessel ischemic changes. Emergency department have contacted neurology the plan is patient to be transferred to [facility], for further workup. Patient is small and 12 hours out of the window  for intervention.   Patient reports recurrent chest pain under his left armpit worse with reaching over and doing gardening work. Patient have had a negative stress test in November 2015.   Patient has been diabetic since 2001. He has currently diabetic neuropathy and CKD with baseline Cr around  1.3-1.5  Of note patient has history of aortic stenosis his last echogram in November 2015 sure normal LV function at that point he had mild aortic stenosis with mean gradient  of 10 mmHg. She's been followed by cardiology for this.   Hospitalist was called for admission for stroke involving left basal ganglia  Review of Systems:    Pertinent positives include:   localizing neurological complaints,  weakness, right facial droop, slurred speech, gait abnormality  Constitutional:  No weight loss, night sweats, Fevers, chills, fatigue, weight loss  HEENT:  No headaches, Difficulty swallowing,Tooth/dental problems,Sore throat,  No sneezing, itching, ear ache, nasal congestion, post nasal drip,  Cardio-vascular:  No chest pain, Orthopnea, PND, anasarca, dizziness, palpitations.no Bilateral lower extremity swelling  GI:  No heartburn, indigestion, abdominal pain, nausea, vomiting, diarrhea, change in bowel habits, loss of appetite, melena, blood in stool, hematemesis Resp:  no shortness of breath at rest. No dyspnea on exertion, No excess mucus, no productive cough, No non-productive cough, No coughing up of blood.No change in color of mucus.No wheezing. Skin:  no rash or lesions. No jaundice GU:  no dysuria, change in color of urine, no urgency or frequency. No straining to urinate.  No flank pain.  Musculoskeletal:  No joint pain or no joint swelling. No decreased range of motion. No back pain.  Psych:  No change in mood or affect. No depression or anxiety. No memory loss.  Neuro: nono double vision,   no confusion  Otherwise ROS are negative except for above, 10 systems were reviewed  Past Medical History: Past Medical History  Diagnosis Date  . Diabetes mellitus without complication   . Neuropathy, diabetic   . Hypertension   . Renal insufficiency  Past Surgical History  Procedure Laterality Date  . Back surgery    . Broken leg    . Cervical disc surgery    . Tonsillectomy    . Appendectomy       Medications: Prior to Admission medications   Medication Sig Start Date End Date Taking? Authorizing Provider  aspirin EC 81 MG tablet Take 81  mg by mouth daily.   Yes Historical Provider, MD  B Complex-C (B-COMPLEX WITH VITAMIN C) tablet Take 1 tablet by mouth daily.   Yes Historical Provider, MD  calcium carbonate (OS-CAL) 600 MG TABS Take 600 mg by mouth daily with breakfast.    Yes Historical Provider, MD  Carboxymethylcellulose Sodium (REFRESH LIQUIGEL OP) Apply 1 drop to eye 4 (four) times daily.   Yes Historical Provider, MD  cephALEXin (KEFLEX) 500 MG capsule Take 1 capsule (500 mg total) by mouth 2 (two) times daily. 11/28/14  Yes Everlene Balls, MD  cyclobenzaprine (FLEXERIL) 10 MG tablet Take 10 mg by mouth at bedtime.   Yes Historical Provider, MD  doxycycline (VIBRA-TABS) 100 MG tablet Take 100 mg by mouth daily.    Yes Historical Provider, MD  DULoxetine (CYMBALTA) 30 MG capsule Take 30 mg by mouth daily. Take with a 60 to make 90   Yes Historical Provider, MD  DULoxetine (CYMBALTA) 60 MG capsule Take 60 mg by mouth daily. Take with 30 to make 90   Yes Historical Provider, MD  glyBURIDE-metformin (GLUCOVANCE) 2.5-500 MG per tablet Take 2 tablets by mouth daily with breakfast.   Yes Historical Provider, MD  hydrochlorothiazide (HYDRODIURIL) 25 MG tablet Take 25 mg by mouth daily.   Yes Historical Provider, MD  irbesartan (AVAPRO) 150 MG tablet Take 150 mg by mouth daily.   Yes Historical Provider, MD  LORazepam (ATIVAN) 2 MG tablet Take 2 mg by mouth at bedtime.   Yes Historical Provider, MD  Magnesium Citrate 100 MG TABS Take 1 tablet by mouth daily.   Yes Historical Provider, MD  meloxicam (MOBIC) 15 MG tablet Take 15 mg by mouth daily.   Yes Historical Provider, MD  pramipexole (MIRAPEX) 0.5 MG tablet Take 0.5 mg by mouth 3 (three) times daily.   Yes Historical Provider, MD  pravastatin (PRAVACHOL) 40 MG tablet Take 40 mg by mouth daily.   Yes Historical Provider, MD  Propylene Glycol (SYSTANE BALANCE) 0.6 % SOLN Apply 1 drop to eye 4 (four) times daily.   Yes Historical Provider, MD  tamsulosin (FLOMAX) 0.4 MG CAPS Take by  mouth.   Yes Historical Provider, MD  vitamin C (ASCORBIC ACID) 500 MG tablet Take 500 mg by mouth daily.   Yes Historical Provider, MD    Allergies:  No Known Allergies  Social History:  Ambulatory   Independently  Lives at home  With family     reports that he quit smoking about 34 years ago. He does not have any smokeless tobacco history on file. He reports that he drinks about 0.6 oz of alcohol per week.    Family History: family history includes Alzheimer's disease in his sister; Cancer - Lung in his brother and sister; Cirrhosis in his father; Diabetes in his brother; Heart disease in his sister.    Physical Exam: Patient Vitals for the past 24 hrs:  BP Temp Pulse Resp SpO2  11/28/14 1920 186/94 mmHg - 85 - 98 %  11/28/14 1904 175/74 mmHg - 89 19 98 %  11/28/14 1700 162/95 mmHg - 94 25 96 %  11/28/14 1657 159/82 mmHg - 100 24 96 %  11/28/14 1630 159/82 mmHg - 92 17 97 %  11/28/14 1534 - 97.3 F (36.3 C) - - -    1. General:  in No Acute distress 2. Psychological: Alert and  Oriented 3. Head/ENT:     Dry Mucous Membranes                          Head Non traumatic, neck supple                          Normal   Dentition 4. SKIN:  decreased Skin turgor,  Skin clean Dry and intact no rash 5. Heart: Regular rate and rhythm no Murmur, Rub or gallop 6. Lungs: Clear to auscultation bilaterally, no wheezes or crackles   7. Abdomen: Soft, non-tender, slightly distended 8. Lower extremities: no clubbing, cyanosis, or edema 9. Neurologically  strength in all 4 extremities appear to be intact no pronated drift noted cranial nerves II to be intact except for mild right facial droop. 10. MSK: Normal range of motion  body mass index is unknown because there is no weight on file.   Labs on Admission:   Results for orders placed or performed during the hospital encounter of 11/28/14 (from the past 24 hour(s))  CBG monitoring, ED     Status: None   Collection Time: 11/28/14   3:02 PM  Result Value Ref Range   Glucose-Capillary 84 70 - 99 mg/dL  Ethanol     Status: None   Collection Time: 11/28/14  3:20 PM  Result Value Ref Range   Alcohol, Ethyl (B) <5 0 - 9 mg/dL  Protime-INR     Status: None   Collection Time: 11/28/14  3:20 PM  Result Value Ref Range   Prothrombin Time 12.2 11.6 - 15.2 seconds   INR 0.90 0.00 - 1.49  APTT     Status: None   Collection Time: 11/28/14  3:20 PM  Result Value Ref Range   aPTT 31 24 - 37 seconds  CBC     Status: Abnormal   Collection Time: 11/28/14  3:20 PM  Result Value Ref Range   WBC 7.3 4.0 - 10.5 K/uL   RBC 3.60 (L) 4.22 - 5.81 MIL/uL   Hemoglobin 11.1 (L) 13.0 - 17.0 g/dL   HCT 34.3 (L) 39.0 - 52.0 %   MCV 95.3 78.0 - 100.0 fL   MCH 30.8 26.0 - 34.0 pg   MCHC 32.4 30.0 - 36.0 g/dL   RDW 12.7 11.5 - 15.5 %   Platelets 207 150 - 400 K/uL  Differential     Status: None   Collection Time: 11/28/14  3:20 PM  Result Value Ref Range   Neutrophils Relative % 65 43 - 77 %   Neutro Abs 4.7 1.7 - 7.7 K/uL   Lymphocytes Relative 22 12 - 46 %   Lymphs Abs 1.6 0.7 - 4.0 K/uL   Monocytes Relative 10 3 - 12 %   Monocytes Absolute 0.7 0.1 - 1.0 K/uL   Eosinophils Relative 4 0 - 5 %   Eosinophils Absolute 0.3 0.0 - 0.7 K/uL   Basophils Relative 0 0 - 1 %   Basophils Absolute 0.0 0.0 - 0.1 K/uL  Comprehensive metabolic panel     Status: Abnormal   Collection Time: 11/28/14  3:20 PM  Result Value Ref Range   Sodium 139  135 - 145 mmol/L   Potassium 5.0 3.5 - 5.1 mmol/L   Chloride 103 96 - 112 mmol/L   CO2 28 19 - 32 mmol/L   Glucose, Bld 86 70 - 99 mg/dL   BUN 43 (H) 6 - 23 mg/dL   Creatinine, Ser 1.43 (H) 0.50 - 1.35 mg/dL   Calcium 9.9 8.4 - 10.5 mg/dL   Total Protein 6.6 6.0 - 8.3 g/dL   Albumin 3.8 3.5 - 5.2 g/dL   AST 24 0 - 37 U/L   ALT 23 0 - 53 U/L   Alkaline Phosphatase 69 39 - 117 U/L   Total Bilirubin 0.7 0.3 - 1.2 mg/dL   GFR calc non Af Amer 42 (L) >90 mL/min   GFR calc Af Amer 49 (L) >90 mL/min    Anion gap 8 5 - 15  I-stat troponin, ED (not at [facility], [facility])     Status: None   Collection Time: 11/28/14  3:26 PM  Result Value Ref Range   Troponin i, poc 0.00 0.00 - 0.08 ng/mL   Comment 3          I-Stat Chem 8, ED     Status: Abnormal   Collection Time: 11/28/14  3:28 PM  Result Value Ref Range   Sodium 136 135 - 145 mmol/L   Potassium 4.9 3.5 - 5.1 mmol/L   Chloride 101 96 - 112 mmol/L   BUN 45 (H) 6 - 23 mg/dL   Creatinine, Ser 1.70 (H) 0.50 - 1.35 mg/dL   Glucose, Bld 81 70 - 99 mg/dL   Calcium, Ion 1.26 1.13 - 1.30 mmol/L   TCO2 23 0 - 100 mmol/L   Hemoglobin 11.6 (L) 13.0 - 17.0 g/dL   HCT 34.0 (L) 39.0 - 52.0 %  Urine Drug Screen     Status: Abnormal   Collection Time: 11/28/14  5:00 PM  Result Value Ref Range   Opiates POSITIVE (A) NONE DETECTED   Cocaine NONE DETECTED NONE DETECTED   Benzodiazepines POSITIVE (A) NONE DETECTED   Amphetamines NONE DETECTED NONE DETECTED   Tetrahydrocannabinol NONE DETECTED NONE DETECTED   Barbiturates NONE DETECTED NONE DETECTED  Urinalysis, Routine w reflex microscopic     Status: None   Collection Time: 11/28/14  5:01 PM  Result Value Ref Range   Color, Urine YELLOW YELLOW   APPearance CLEAR CLEAR   Specific Gravity, Urine 1.013 1.005 - 1.030   pH 6.0 5.0 - 8.0   Glucose, UA NEGATIVE NEGATIVE mg/dL   Hgb urine dipstick NEGATIVE NEGATIVE   Bilirubin Urine NEGATIVE NEGATIVE   Ketones, ur NEGATIVE NEGATIVE mg/dL   Protein, ur NEGATIVE NEGATIVE mg/dL   Urobilinogen, UA 0.2 0.0 - 1.0 mg/dL   Nitrite NEGATIVE NEGATIVE   Leukocytes, UA NEGATIVE NEGATIVE    UA no evidence of UTI  No results found for: HGBA1C  CrCl cannot be calculated (Unknown ideal weight.).  BNP (last 3 results) No results for input(s): PROBNP in the last 8760 hours.  Other results:  I have pearsonaly reviewed this: ECG REPORT  Rate: 104  Rhythm: This tachycardia ST&T Change: Nonspecific ST segment changes but she had diffuse unchanged from prior QTC  429  There were no vitals filed for this visit.   Cultures: No results found for: SDES, SPECREQUEST, CULT, REPTSTATUS   Radiological Exams on Admission: Ct Head Wo Contrast  11/28/2014   CLINICAL DATA:  One day history of slurred speech  EXAM: CT HEAD WITHOUT CONTRAST  TECHNIQUE: Contiguous axial  images were obtained from the base of the skull through the vertex without intravenous contrast.  COMPARISON:  Head CT Dec 21, 2012 and brain MRI January 03, 2013  FINDINGS: There is mild diffuse atrophy. There is no intracranial mass, hemorrhage, extra-axial fluid collection, or midline shift. There is patchy small vessel disease in the centra semiovale bilaterally. There are no new gray-white compartment lesions. No acute infarct apparent. Bony calvarium appears intact. The mastoid air cells are clear. Note that the mastoids on the right are somewhat hypoplastic.  IMPRESSION: Atrophy with periventricular small vessel disease. No intracranial mass, hemorrhage, or acute appearing infarct.   Electronically Signed   By: Lowella Grip III M.D.   On: 11/28/2014 15:47   Mr Brain Wo Contrast  11/28/2014   CLINICAL DATA:  Right-sided facial droop beginning yesterday. Slurred speech.  EXAM: MRI HEAD WITHOUT CONTRAST  TECHNIQUE: Multiplanar, multiecho pulse sequences of the brain and surrounding structures were obtained without intravenous contrast.  COMPARISON:  Head CT same day.  MRI 01/03/2013.  FINDINGS: There is a sub cm region of acute infarction in the right basal ganglia and corona radiata. No other acute infarction.  The brainstem and cerebellum are unremarkable. There chronic small-vessel ischemic changes affecting the cerebral deep and subcortical white matter. No large vessel territory infarction. No mass lesion, hemorrhage, hydrocephalus or extra-axial collection. No pituitary mass. No inflammatory sinus disease. No skull or skullbase lesion.  IMPRESSION: Acute sub cm infarction affecting left basal  ganglia/ corona radiata. No swelling or hemorrhage.  Chronic small vessel ischemic changes elsewhere throughout the brain.   Electronically Signed   By: Nelson Chimes M.D.   On: 11/28/2014 19:03   Dg Toe Great Left  11/28/2014   CLINICAL DATA:  Initial encounter for stubbed toe at home today. Bleeding.  EXAM: LEFT GREAT TOE  COMPARISON:  None.  FINDINGS: Soft tissue bandage obscures detail. Fracture involving the dorsalaspect of the proximal most portion of the distal phalanx of the great toe. Intra-articular extension. This has both medial and lateral components.  Artifactual lucency through the proximal phalanx of the second digit.  IMPRESSION: Intra-articular fracture involving the proximal portion of the distal phalanx of the first digit.   Electronically Signed   By: Abigail Miyamoto M.D.   On: 11/28/2014 02:24    Chart has been reviewed  Family at  Bedside  plan of care was discussed with  Wife Loletha Carrow [phone]  Assessment/Plan 81 year old gentleman with risk factors of diabetes, hypertension, hyperlipidemia, presented with slurred speech and right facial droop MRI of the head confirmed left basal ganglia stroke. Patient is being admitted to [facility]. A neurology consult.  Present on Admission:  . CVA (cerebral infarction) -  - will admit based on TIA/CVA protocol,  Carotid Doppler and Echo, obtain cardiac enzymes,  ECG,   Lipid panel, TSH. Order PT/OT evaluation. Will make sure patient is on antiplatelet agent.   Neurology consult by Dr. Leonel Ramsay is aware.     . Essential hypertension - given somewhat elevated creatinine will hold ARB. Permissive hypertension in the setting of acute CVA     . Chronic kidney disease (CKD) stage G2/A1, mildly decreased glomerular filtration rate (GFR) between 60-89 mL/min/1.73 square meter and albuminuria creatinine ratio less than 30 mg/g slightly worsening renal function will give gentle IV fluids hold here being follow-up creatinine  . Neuropathy in  diabetes - patient sustained laceration to left great toe. At risk for poor healing. Patient will need to have close  follow-up with orthopedics Dr. Erlinda Hong was aware she was supposed to follow-up with him this week. This patient has been admitted would let Dr. Erlinda Hong know regarding toe injury tomorow  . Toe fracture, left - will need orthopedics consult once stable patient likely will need rehabilitation  . DM (diabetes mellitus) type II controlled with renal manifestation -SSI all by mouth meds for now until patient is cleared for diet   Prophylaxis: SCD   CODE STATUS:  FULL CODE  as per patient    Disposition:   likely will need placement for rehabilitation                             Other plan as per orders.  I have spent a total of 65 min on this admission  Adriaan Maltese 11/28/2014, 7:50 PM  [facility]  Pager [phone]   after 2 AM please page floor coverage PA If 7AM-7PM, please contact the day team taking care of the patient  [email or web address]  Password [email or web address]

## 2014-11-28 NOTE — ED Provider Notes (Signed)
CSN: [id number]     Arrival date & time 11/27/14  2358 History   First MD Initiated Contact with Patient 11/28/14 0404     Chief Complaint  Patient presents with  . Toe Injury     (Consider location/radiation/quality/duration/timing/severity/associated sxs/prior Treatment) HPI  Kristopher Zimmerman is a 81 y.o. male with past medical history of hypertension, diabetes presenting today with a laceration. Patient is sleepy and history was obtained by wife. Wife states around 11:30 PM patient was walking to bed. He stopped his left foot on her dresser. He successfully has a laceration. Patient has diabetic neuropathy and does not have good sensation in his toe. He denies injury anywhere else or pain. Patient has no further complaints.  10 Systems reviewed and are negative for acute change except as noted in the HPI.    Past Medical History  Diagnosis Date  . Diabetes mellitus without complication   . Neuropathy, diabetic   . Hypertension   . Renal insufficiency    Past Surgical History  Procedure Laterality Date  . Back surgery    . Broken leg    . Cervical disc surgery    . Tonsillectomy    . Appendectomy     Family History  Problem Relation Age of Onset  . Cirrhosis Father   . Diabetes Brother   . Cancer - Lung Sister   . Alzheimer's disease Sister   . Heart disease Sister   . Cancer - Lung Brother    History  Substance Use Topics  . Smoking status: Former Smoker    Quit date: 12/22/1979  . Smokeless tobacco: Not on file  . Alcohol Use: 0.6 oz/week    1 Glasses of wine per week     Comment: wine    Review of Systems    Allergies  Review of patient's allergies indicates no known allergies.  Home Medications   Prior to Admission medications   Medication Sig Start Date End Date Taking? Authorizing Provider  aspirin EC 81 MG tablet Take 81 mg by mouth daily.   Yes Historical Provider, MD  B Complex-C (B-COMPLEX WITH VITAMIN C) tablet Take 1 tablet by mouth daily.    Yes Historical Provider, MD  calcium carbonate (OS-CAL) 600 MG TABS Take 600 mg by mouth daily with breakfast.    Yes Historical Provider, MD  Carboxymethylcellulose Sodium (REFRESH LIQUIGEL OP) Apply 1 drop to eye 4 (four) times daily.   Yes Historical Provider, MD  cyclobenzaprine (FLEXERIL) 10 MG tablet Take 10 mg by mouth at bedtime.   Yes Historical Provider, MD  doxycycline (VIBRA-TABS) 100 MG tablet Take 100 mg by mouth daily.    Yes Historical Provider, MD  DULoxetine (CYMBALTA) 30 MG capsule Take 30 mg by mouth daily. Take with a 60 to make 90   Yes Historical Provider, MD  DULoxetine (CYMBALTA) 60 MG capsule Take 60 mg by mouth daily. Take with 30 to make 90   Yes Historical Provider, MD  glyBURIDE-metformin (GLUCOVANCE) 2.5-500 MG per tablet Take 2 tablets by mouth daily with breakfast.   Yes Historical Provider, MD  hydrochlorothiazide (HYDRODIURIL) 25 MG tablet Take 25 mg by mouth daily.   Yes Historical Provider, MD  irbesartan (AVAPRO) 150 MG tablet Take 150 mg by mouth daily.   Yes Historical Provider, MD  LORazepam (ATIVAN) 2 MG tablet Take 2 mg by mouth at bedtime.   Yes Historical Provider, MD  Magnesium Citrate 100 MG TABS Take 1 tablet by mouth daily.   Yes  Historical Provider, MD  meloxicam (MOBIC) 15 MG tablet Take 15 mg by mouth daily.   Yes Historical Provider, MD  pramipexole (MIRAPEX) 0.5 MG tablet Take 0.5 mg by mouth 3 (three) times daily.   Yes Historical Provider, MD  pravastatin (PRAVACHOL) 40 MG tablet Take 40 mg by mouth daily.   Yes Historical Provider, MD  Propylene Glycol (SYSTANE BALANCE) 0.6 % SOLN Apply 1 drop to eye 4 (four) times daily.   Yes Historical Provider, MD  tamsulosin (FLOMAX) 0.4 MG CAPS Take by mouth.   Yes Historical Provider, MD  vitamin C (ASCORBIC ACID) 500 MG tablet Take 500 mg by mouth daily.   Yes Historical Provider, MD  b complex vitamins tablet Take 1 tablet by mouth daily.    Historical Provider, MD   BP 161/74 mmHg  Pulse 80   Temp(Src) 98.6 F (37 C) (Oral)  Resp 18  SpO2 94% Physical Exam  Constitutional: He is oriented to person, place, and time. Vital signs are normal. He appears well-developed and well-nourished.  Non-toxic appearance. He does not appear ill. No distress.  HENT:  Head: Normocephalic and atraumatic.  Nose: Nose normal.  Mouth/Throat: Oropharynx is clear and moist. No oropharyngeal exudate.  Eyes: Conjunctivae and EOM are normal. Pupils are equal, round, and reactive to light. No scleral icterus.  Neck: Normal range of motion. Neck supple. No tracheal deviation, no edema, no erythema and normal range of motion present. No thyroid mass and no thyromegaly present.  Cardiovascular: Normal rate, regular rhythm, S1 normal, S2 normal, normal heart sounds, intact distal pulses and normal pulses.  Exam reveals no gallop and no friction rub.   No murmur heard. Pulses:      Radial pulses are 2+ on the right side, and 2+ on the left side.       Dorsalis pedis pulses are 2+ on the right side, and 2+ on the left side.  Pulmonary/Chest: Effort normal and breath sounds normal. No respiratory distress. He has no wheezes. He has no rhonchi. He has no rales.  Abdominal: Soft. Normal appearance and bowel sounds are normal. He exhibits no distension, no ascites and no mass. There is no hepatosplenomegaly. There is no tenderness. There is no rebound, no guarding and no CVA tenderness.  Musculoskeletal: Normal range of motion. He exhibits tenderness. He exhibits no edema.  3 cm laceration on the dorsum of the left great toe. There is no active bleeding.  Lymphadenopathy:    He has no cervical adenopathy.  Neurological: He is alert and oriented to person, place, and time. He has normal strength. No cranial nerve deficit or sensory deficit.  Skin: Skin is warm, dry and intact. No petechiae and no rash noted. He is not diaphoretic. No erythema. No pallor.  Psychiatric: He has a normal mood and affect. His behavior is  normal. Judgment normal.  Nursing note and vitals reviewed.   ED Course  Procedures (including critical care time) Labs Review Labs Reviewed - No data to display  Imaging Review Dg Toe Great Left  11/28/2014   CLINICAL DATA:  Initial encounter for stubbed toe at home today. Bleeding.  EXAM: LEFT GREAT TOE  COMPARISON:  None.  FINDINGS: Soft tissue bandage obscures detail. Fracture involving the dorsalaspect of the proximal most portion of the distal phalanx of the great toe. Intra-articular extension. This has both medial and lateral components.  Artifactual lucency through the proximal phalanx of the second digit.  IMPRESSION: Intra-articular fracture involving the proximal portion of  the distal phalanx of the first digit.   Electronically Signed   By: Abigail Miyamoto M.D.   On: 11/28/2014 02:24     EKG Interpretation None      MDM   Final diagnoses:  None   patient presents emergency department after hitting his toe on her dresser. X-ray reveals intra-articular fracture of the distal phalanx of the great toe. There is laceration as well. I spoke with Dr. Erlinda Hong with orthopedic surgery recommends ED washout and loose sutures. Patient was also given Keflex in the emergency room time. He will be sent home with a prescription of the same. He requests to see the patient in clinic this coming week. Follow-up will be given to the patient. See laceration note below. His vital signs were within his normal limits and is safe for discharge.  LACERATION REPAIR Performed by: Everlene Balls Authorized byEverlene Balls Consent: Verbal consent obtained. Risks and benefits: risks, benefits and alternatives were discussed Consent given by: patient Patient identity confirmed: provided demographic data Prepped and Draped in normal sterile fashion Wound explored  Laceration Location: L great toe  Laceration Length: 3 cm  No Foreign Bodies seen or palpated  Anesthesia: local infiltration  Local  anesthetic: lidocaine 1% with epinephrine  Anesthetic total: 5 ml  Irrigation method: syringe Amount of cleaning: standard  Skin closure: sutures, 3-0 ethilon  Number of sutures: 4  Technique: loose simple interrupted  Patient tolerance: Patient tolerated the procedure well with no immediate complications.   Everlene Balls, MD 11/28/14 [phone]

## 2014-11-28 NOTE — Consult Note (Signed)
Neurology Consultation Reason for Consult: Stroke Referring Physician: Hillard Danker  CC: falls  History is obtained from:patient, wife  HPI: Kristopher Zimmerman is a 81 y.o. male with a history of dm, htn who presents with slurred speech and increased falls since yesterday. He injured his toe fairly badly yesterday and was in the ER. He had taken his evening medications prior to coming in, so his wife did not think anything of the slurred speech and it was not mentioned to ER staff. Today, ti was still present and therefore she returned to the ED. He denies new numbness. He states "she thinks I do" when asked if he has slurred speech.    LKW: 4/29 4pm tpa given?: no, out of window.    ROS: A 14 point ROS was performed and is negative except as noted in the HPI.   Past Medical History  Diagnosis Date  . Diabetes mellitus without complication   . Neuropathy, diabetic   . Hypertension   . Renal insufficiency     Family History: Cancer, dn, alzheimers  Social History: Tob: quit 35 years ago  Exam: Current vital signs: BP 207/89 mmHg  Pulse 96  Temp(Src) 97.7 F (36.5 C) (Oral)  Resp 16  Ht 5\' 10"  (1.778 m)  Wt 92.715 kg (204 lb 6.4 oz)  BMI 29.33 kg/m2  SpO2 98% Vital signs in last 24 hours: Temp:  [97.3 F (36.3 C)-98.6 F (37 C)] 97.7 F (36.5 C) (04/30 2209) Pulse Rate:  [80-102] 96 (04/30 2209) Resp:  [13-25] 16 (04/30 2209) BP: (146-207)/(72-95) 207/89 mmHg (04/30 2209) SpO2:  [94 %-100 %] 98 % (04/30 2209) Weight:  [92.715 kg (204 lb 6.4 oz)] 92.715 kg (204 lb 6.4 oz) (04/30 2209)   Physical Exam  Constitutional: Appears well-developed and well-nourished.  Psych: Affect appropriate to situation Eyes: No scleral injection HENT: No OP obstrucion Head: Normocephalic.  Cardiovascular: Normal rate and regular rhythm.  Respiratory: Effort normal and breath sounds normal to anterior ascultation GI: Soft.  No distension. There is no tenderness.  Skin:  WDI  Neuro: Mental Status: Patient is awake, alert, interactive and appropriate Patient is able to give a clear and coherent history. No signs of aphasia or neglect Cranial Nerves: II: Visual Fields are full. Pupils are equal, round, and reactive to light.   III,IV, VI: EOMI without ptosis or diploplia.  V: Facial sensation is symmetric to temperature VII: Facial movement is notable for right droop VIII: hearing is intact to voice X: Uvula elevates symmetrically XI: Shoulder shrug is symmetric. XII: tongue is midline without atrophy or fasciculations.  Motor: Tone is normal. Bulk is normal. 5/5 strength was present in all four extremities.  Sensory: Sensation is symmetric to light touch and temperature in the arms and legs. Cerebellar: FNF  intact bilaterally   I have reviewed labs in epic and the results pertinent to this consultation are: cmp - elevated creatinine  I have reviewed the images obtained: MRI brain small suubcortical stroke.   Impression: 81 yo M with small subcortical stroke. High functioning for age at baseline.   Recommendations: 1. HgbA1c, fasting lipid panel 2. MRA  of the brain without contrast 3. Frequent neuro checks 4. Echocardiogram 5. Carotid dopplers 6. Prophylactic therapy-Antiplatelet med: Aspirin - dose 325mg  PO or 300mg  PR 7. Risk factor modification 8. Telemetry monitoring 9. PT consult, OT consult, Speech consult    Roland Rack, MD [facility] [phone]  If 7pm- 7am, please page neurology on call as listed in  [facility].

## 2014-11-29 DIAGNOSIS — S92912G Unspecified fracture of left toe(s), subsequent encounter for fracture with delayed healing: Secondary | ICD-10-CM

## 2014-11-29 DIAGNOSIS — I639 Cerebral infarction, unspecified: Principal | ICD-10-CM

## 2014-11-29 LAB — GLUCOSE, CAPILLARY
GLUCOSE-CAPILLARY: 107 mg/dL — AB (ref 70–99)
GLUCOSE-CAPILLARY: 159 mg/dL — AB (ref 70–99)
Glucose-Capillary: 116 mg/dL — ABNORMAL HIGH (ref 70–99)
Glucose-Capillary: 161 mg/dL — ABNORMAL HIGH (ref 70–99)
Glucose-Capillary: 124 mg/dL — ABNORMAL HIGH (ref 70–99)

## 2014-11-29 LAB — TROPONIN I: Troponin I: <0.03 ng/mL (ref <0.031)

## 2014-11-29 LAB — LIPID PANEL
Cholesterol: 109 mg/dL (ref 0–200)
HDL: 49 mg/dL (ref >40)
LDL Cholesterol: 43 mg/dL (ref 0–99)
Total CHOL/HDL Ratio: 2.2 RATIO
Triglycerides: 83 mg/dL (ref <150)
VLDL: 17 mg/dL (ref 0–40)

## 2014-11-29 MED ORDER — IRBESARTAN 150 MG PO TABS
150.0000 mg | ORAL_TABLET | Freq: Every day | ORAL | Status: DC
  Administered 2014-11-29 – 2014-11-30 (×2): 150 mg via ORAL
  Filled 2014-11-29 (×2): qty 1

## 2014-11-29 MED ORDER — INSULIN ASPART 100 UNIT/ML ~~LOC~~ SOLN
0.0000 [IU] | Freq: Three times a day (TID) | SUBCUTANEOUS | Status: DC
  Administered 2014-11-30 (×2): 2 [IU] via SUBCUTANEOUS

## 2014-11-29 NOTE — Progress Notes (Signed)
Stroke Team Progress Note  HISTORY  81 y.o. male with a history of dm, htn who presents with slurred speech and increased falls since yesterday. He injured his toe fairly badly yesterday and was in the ER. He had taken his evening medications prior to coming in, so his wife did not think anything of the slurred speech and it was not mentioned to ER staff.   SUBJECTIVE Speech is back to baseline.    OBJECTIVE Most recent Vital Signs: Filed Vitals:   11/29/14 0400 11/29/14 0600 11/29/14 0800 11/29/14 0933  BP: 176/88 166/89 172/90 169/88  Pulse: 85 86 111 91  Temp: 97.9 F (36.6 C) 97.8 F (36.6 C)  97.8 F (36.6 C)  TempSrc: Oral Oral  Oral  Resp: 17 18  18   Height:      Weight:      SpO2: 99% 98% 98% 99%   CBG (last 3)   Recent Labs  11/28/14 2339 11/29/14 0330 11/29/14 0744  GLUCAP 131* 107* 116*    IV Fluid Intake:   . sodium chloride 75 mL/hr at 11/28/14 2338    MEDICATIONS  . aspirin  300 mg Rectal Daily   Or  . aspirin  325 mg Oral Daily  . cephALEXin  500 mg Oral BID  . cyclobenzaprine  10 mg Oral QHS  . doxycycline  100 mg Oral Daily  . DULoxetine  90 mg Oral Daily  . insulin aspart  0-9 Units Subcutaneous 6 times per day  . pramipexole  0.5 mg Oral QHS  . pravastatin  40 mg Oral Daily  . tamsulosin  0.4 mg Oral QPC supper   PRN:  acetaminophen **OR** acetaminophen, LORazepam, senna-docusate  Diet:  Diet heart healthy/carb modified Room service appropriate?: Yes; Fluid consistency:: Thin   Activity: Up with assistance DVT Prophylaxis:  SCDs/heparin sq   CLINICALLY SIGNIFICANT STUDIES Basic Metabolic Panel:  Recent Labs Lab 11/28/14 1520 11/28/14 1528  NA 139 136  K 5.0 4.9  CL 103 101  CO2 28  --   GLUCOSE 86 81  BUN 43* 45*  CREATININE 1.43* 1.70*  CALCIUM 9.9  --    Liver Function Tests:  Recent Labs Lab 11/28/14 1520  AST 24  ALT 23  ALKPHOS 69  BILITOT 0.7  PROT 6.6  ALBUMIN 3.8   CBC:  Recent Labs Lab 11/28/14 1520  11/28/14 1528  WBC 7.3  --   NEUTROABS 4.7  --   HGB 11.1* 11.6*  HCT 34.3* 34.0*  MCV 95.3  --   PLT 207  --    Coagulation:  Recent Labs Lab 11/28/14 1520  LABPROT 12.2  INR 0.90   Cardiac Enzymes:  Recent Labs Lab 11/28/14 0002 11/29/14 0531  TROPONINI <0.03 <0.03   Urinalysis:  Recent Labs Lab 11/28/14 1701  COLORURINE YELLOW  LABSPEC 1.013  PHURINE 6.0  GLUCOSEU NEGATIVE  HGBUR NEGATIVE  BILIRUBINUR NEGATIVE  KETONESUR NEGATIVE  PROTEINUR NEGATIVE  UROBILINOGEN 0.2  NITRITE NEGATIVE  LEUKOCYTESUR NEGATIVE   Lipid Panel    Component Value Date/Time   CHOL 109 11/29/2014 0531   TRIG 83 11/29/2014 0531   HDL 49 11/29/2014 0531   CHOLHDL 2.2 11/29/2014 0531   VLDL 17 11/29/2014 0531   LDLCALC 43 11/29/2014 0531   HgbA1C No results found for: HGBA1C  Urine Drug Screen:     Component Value Date/Time   LABOPIA POSITIVE* 11/28/2014 1700   COCAINSCRNUR NONE DETECTED 11/28/2014 1700   LABBENZ POSITIVE* 11/28/2014 1700   AMPHETMU  NONE DETECTED 11/28/2014 1700   THCU NONE DETECTED 11/28/2014 [place] DETECTED 11/28/2014 1700    Alcohol Level:  Recent Labs Lab 11/28/14 [place] <5    Dg Chest 2 View  11/29/2014   CLINICAL DATA:  Status post cerebrovascular accident. Initial encounter.  EXAM: CHEST  2 VIEW  COMPARISON:  Chest radiograph performed 02/14/2005  FINDINGS: The lungs are well-aerated. Mild left basilar atelectasis is noted. Peribronchial thickening is noted. There is no evidence of pleural effusion or pneumothorax.  The heart is normal in size; the mediastinal contour is within normal limits. No acute osseous abnormalities are seen.  IMPRESSION: Mild left basilar atelectasis noted.  Peribronchial thickening seen.   Electronically Signed   By: Garald Balding M.D.   On: 11/29/2014 03:54   Ct Head Wo Contrast  11/28/2014   CLINICAL DATA:  One day history of slurred speech  EXAM: CT HEAD WITHOUT CONTRAST  TECHNIQUE: Contiguous axial  images were obtained from the base of the skull through the vertex without intravenous contrast.  COMPARISON:  Head CT Dec 21, 2012 and brain MRI January 03, 2013  FINDINGS: There is mild diffuse atrophy. There is no intracranial mass, hemorrhage, extra-axial fluid collection, or midline shift. There is patchy small vessel disease in the centra semiovale bilaterally. There are no new gray-white compartment lesions. No acute infarct apparent. Bony calvarium appears intact. The mastoid air cells are clear. Note that the mastoids on the right are somewhat hypoplastic.  IMPRESSION: Atrophy with periventricular small vessel disease. No intracranial mass, hemorrhage, or acute appearing infarct.   Electronically Signed   By: Lowella Grip III M.D.   On: 11/28/2014 15:47   Mr Brain Wo Contrast  11/28/2014   CLINICAL DATA:  Right-sided facial droop beginning yesterday. Slurred speech.  EXAM: MRI HEAD WITHOUT CONTRAST  TECHNIQUE: Multiplanar, multiecho pulse sequences of the brain and surrounding structures were obtained without intravenous contrast.  COMPARISON:  Head CT same day.  MRI 01/03/2013.  FINDINGS: There is a sub cm region of acute infarction in the right basal ganglia and corona radiata. No other acute infarction.  The brainstem and cerebellum are unremarkable. There chronic small-vessel ischemic changes affecting the cerebral deep and subcortical white matter. No large vessel territory infarction. No mass lesion, hemorrhage, hydrocephalus or extra-axial collection. No pituitary mass. No inflammatory sinus disease. No skull or skullbase lesion.  IMPRESSION: Acute sub cm infarction affecting left basal ganglia/ corona radiata. No swelling or hemorrhage.  Chronic small vessel ischemic changes elsewhere throughout the brain.   Electronically Signed   By: Nelson Chimes M.D.   On: 11/28/2014 19:03   Dg Toe Great Left  11/28/2014   CLINICAL DATA:  Initial encounter for stubbed toe at home today. Bleeding.  EXAM:  LEFT GREAT TOE  COMPARISON:  None.  FINDINGS: Soft tissue bandage obscures detail. Fracture involving the dorsalaspect of the proximal most portion of the distal phalanx of the great toe. Intra-articular extension. This has both medial and lateral components.  Artifactual lucency through the proximal phalanx of the second digit.  IMPRESSION: Intra-articular fracture involving the proximal portion of the distal phalanx of the first digit.   Electronically Signed   By: Abigail Miyamoto M.D.   On: 11/28/2014 02:24    CT of the brain  Atrophy with periventricular small vessel disease. No intracranial mass, hemorrhage, or acute appearing infarct.   MRI of the brain  Acute sub cm infarction affecting left basal ganglia/ corona radiata.  No swelling or hemorrhage.  MRA of the brain    Carotid Doppler  pending  2D Echocardiogram  Done 11/15  CXR    EKG  normal EKG, normal sinus rhythm, unchanged from previous tracings. For complete results please see formal report.   Therapy Recommendations pending  Physical Exam   Physical Exam  Constitutional: Appears well-developed and well-nourished.  Psych: Affect appropriate to situation Eyes: No scleral injection HENT: No OP obstrucion Head: Normocephalic.  Cardiovascular: Normal rate and regular rhythm.  Respiratory: Effort normal and breath sounds normal to anterior ascultation GI: Soft. No distension. There is no tenderness.  Skin: WDI  Neuro: Mental Status: Patient is awake, alert, interactive and appropriate Patient is able to give a clear and coherent history. No signs of aphasia or neglect Cranial Nerves: II: Visual Fields are full. Pupils are equal, round, and reactive to light.  III,IV, VI: EOMI without ptosis or diploplia.  V: Facial sensation is symmetric to temperature VII: Facial movement is notable for right droop VIII: hearing is intact to voice X: Uvula elevates symmetrically XI: Shoulder shrug is symmetric. XII:  tongue is midline without atrophy or fasciculations.  Motor: Tone is normal. Bulk is normal. 5/5 strength was present in all four extremities.  Sensory: Sensation is symmetric to light touch and temperature in the arms and legs. Cerebellar: FNF intact bilaterally  ASSESSMENT [name] is a 81 y.o. male small subcortical stroke. High functioning for age at baseline. Currently back to baseline. No new complaints.     on ASA 81mg  at home prior to admission    Hospital day # 1  TREATMENT/PLAN  Complete stroke work up and possible d/c today as pt is back to baseline  ASA 325 daily as out pt. Pt was on 81mg  prior to admission.      SIGNED    To contact Stroke Continuity provider, please refer to [email or web address] After hours, contact General Neurology

## 2014-11-29 NOTE — Progress Notes (Signed)
PT Cancellation Note  Patient Details Name: Kristopher Zimmerman MRN: [id number] DOB: 02/28/1924   Cancelled Treatment:    Reason Eval/Treat Not Completed: Other (comment) (Awaiting fracture boot for ambulation) Patient has telephoned his wife to bring fracture boot, which was ordered for a distal phalanx great toe fracture. Will follow up for PT evaluation once this is delivered.  Ellouise Newer 11/29/2014, 9:39 AM Elayne Snare, [place]

## 2014-11-29 NOTE — Progress Notes (Signed)
PROGRESS NOTE  Kristopher Zimmerman [id number] DOB: [birth year] DOA: 11/28/2014 PCP: Merrilee Seashore, MD  Brief history 81 year old male with history of diabetes mellitus, hypertension, CKD stage III presented with slurred speech and frequent falls. The patient had a mechanical fall earlier on the day of admission. The patient was sent home with an orthotic shoe. Unfortunately, the patient developed slurred speech with facial droop and came back Emergency department. CT of the brain was negative, but MRI of the brain showed an acute infarction in the left basal ganglia and corona radiata. Neurology was consulted and recommended stroke Workup. Assessment/Plan: Left basal ganglia stroke -11/28/2014 MRI brain confirms left basal ganglia and corona radiata infarction -Carotid duplex negative for hemodynamically significant stenosis -Echocardiogram pending -LDL 43 -Hemoglobin A1c--pending -PT-->recommends HHPT -Continue aspirin 325 mg daily at discharge CKD stage III -Based on creatinine 1.3-1.4 Diabetes mellitus type 2 -Hemoglobin A1c -NovoLog sliding scale -Hold glyburide and metformin Hypertension -Restart Avapro -Hold HCTZ for now Laceration of the foot with fracture of the distal phalanx of the left first digit  -Continue orthotic boot when ambulating  -Patient has an appointment with Ortho, Dr. Erlinda Hong this week -change dressing -continue cephalexin prophylactically Hyperlipidemia  -Continue statin   Family Communication:   Wife update at beside Disposition Plan:   Home 5/2 if medically stable       Procedures/Studies: Dg Chest 2 View  11/29/2014   CLINICAL DATA:  Status post cerebrovascular accident. Initial encounter.  EXAM: CHEST  2 VIEW  COMPARISON:  Chest radiograph performed 02/14/2005  FINDINGS: The lungs are well-aerated. Mild left basilar atelectasis is noted. Peribronchial thickening is noted. There is no evidence of pleural effusion or pneumothorax.  The  heart is normal in size; the mediastinal contour is within normal limits. No acute osseous abnormalities are seen.  IMPRESSION: Mild left basilar atelectasis noted.  Peribronchial thickening seen.   Electronically Signed   By: Garald Balding M.D.   On: 11/29/2014 03:54   Ct Head Wo Contrast  11/28/2014   CLINICAL DATA:  One day history of slurred speech  EXAM: CT HEAD WITHOUT CONTRAST  TECHNIQUE: Contiguous axial images were obtained from the base of the skull through the vertex without intravenous contrast.  COMPARISON:  Head CT Dec 21, 2012 and brain MRI January 03, 2013  FINDINGS: There is mild diffuse atrophy. There is no intracranial mass, hemorrhage, extra-axial fluid collection, or midline shift. There is patchy small vessel disease in the centra semiovale bilaterally. There are no new gray-white compartment lesions. No acute infarct apparent. Bony calvarium appears intact. The mastoid air cells are clear. Note that the mastoids on the right are somewhat hypoplastic.  IMPRESSION: Atrophy with periventricular small vessel disease. No intracranial mass, hemorrhage, or acute appearing infarct.   Electronically Signed   By: Lowella Grip III M.D.   On: 11/28/2014 15:47   Mr Brain Wo Contrast  11/28/2014   CLINICAL DATA:  Right-sided facial droop beginning yesterday. Slurred speech.  EXAM: MRI HEAD WITHOUT CONTRAST  TECHNIQUE: Multiplanar, multiecho pulse sequences of the brain and surrounding structures were obtained without intravenous contrast.  COMPARISON:  Head CT same day.  MRI 01/03/2013.  FINDINGS: There is a sub cm region of acute infarction in the right basal ganglia and corona radiata. No other acute infarction.  The brainstem and cerebellum are unremarkable. There chronic small-vessel ischemic changes affecting the cerebral deep and subcortical white matter. No large vessel territory infarction. No mass lesion, hemorrhage,  hydrocephalus or extra-axial collection. No pituitary mass. No  inflammatory sinus disease. No skull or skullbase lesion.  IMPRESSION: Acute sub cm infarction affecting left basal ganglia/ corona radiata. No swelling or hemorrhage.  Chronic small vessel ischemic changes elsewhere throughout the brain.   Electronically Signed   By: Nelson Chimes M.D.   On: 11/28/2014 19:03   Dg Toe Great Left  11/28/2014   CLINICAL DATA:  Initial encounter for stubbed toe at home today. Bleeding.  EXAM: LEFT GREAT TOE  COMPARISON:  None.  FINDINGS: Soft tissue bandage obscures detail. Fracture involving the dorsalaspect of the proximal most portion of the distal phalanx of the great toe. Intra-articular extension. This has both medial and lateral components.  Artifactual lucency through the proximal phalanx of the second digit.  IMPRESSION: Intra-articular fracture involving the proximal portion of the distal phalanx of the first digit.   Electronically Signed   By: Abigail Miyamoto M.D.   On: 11/28/2014 02:24         Subjective: Patient denies fevers, chills, headache, chest pain, dyspnea, nausea, vomiting, diarrhea, abdominal pain, dysuria, hematuria   Objective: Filed Vitals:   11/29/14 0800 11/29/14 0933 11/29/14 1400 11/29/14 1705  BP: 172/90 169/88 160/81 167/74  Pulse: 111 91 104 90  Temp:  97.8 F (36.6 C) 98 F (36.7 C) 98 F (36.7 C)  TempSrc:  Oral Oral Oral  Resp:  18 20 20   Height:      Weight:      SpO2: 98% 99% 98% 95%    Intake/Output Summary (Last 24 hours) at 11/29/14 1758 Last data filed at 11/29/14 0438  Gross per 24 hour  Intake      0 ml  Output   1275 ml  Net  -1275 ml   Weight change:  Exam:   General:  Pt is alert, follows commands appropriately, not in acute distress  HEENT: No icterus, No thrush,  [place]/AT  Cardiovascular: RRR, S1/S2, no rubs, no gallops  Respiratory: CTA bilaterally, no wheezing, no crackles, no rhonchi  Abdomen: Soft/+BS, non tender, non distended, no guarding  Extremities: trace LE edema, No lymphangitis,  No petechiae, No rashes, no synovitis  Data Reviewed: Basic Metabolic Panel:  Recent Labs Lab 11/28/14 1520 11/28/14 1528  NA 139 136  K 5.0 4.9  CL 103 101  CO2 28  --   GLUCOSE 86 81  BUN 43* 45*  CREATININE 1.43* 1.70*  CALCIUM 9.9  --    Liver Function Tests:  Recent Labs Lab 11/28/14 1520  AST 24  ALT 23  ALKPHOS 69  BILITOT 0.7  PROT 6.6  ALBUMIN 3.8   No results for input(s): LIPASE, AMYLASE in the last 168 hours. No results for input(s): AMMONIA in the last 168 hours. CBC:  Recent Labs Lab 11/28/14 1520 11/28/14 1528  WBC 7.3  --   NEUTROABS 4.7  --   HGB 11.1* 11.6*  HCT 34.3* 34.0*  MCV 95.3  --   PLT 207  --    Cardiac Enzymes:  Recent Labs Lab 11/28/14 0002 11/29/14 0531 11/29/14 1122  TROPONINI <0.03 <0.03 <0.03   BNP: Invalid input(s): POCBNP CBG:  Recent Labs Lab 11/28/14 2339 11/29/14 0330 11/29/14 0744 11/29/14 1114 11/29/14 1614  GLUCAP 131* 107* 116* 159* 161*    No results found for this or any previous visit (from the past 240 hour(s)).   Scheduled Meds: . aspirin  300 mg Rectal Daily   Or  . aspirin  325 mg  Oral Daily  . cephALEXin  500 mg Oral BID  . cyclobenzaprine  10 mg Oral QHS  . doxycycline  100 mg Oral Daily  . DULoxetine  90 mg Oral Daily  . insulin aspart  0-9 Units Subcutaneous 6 times per day  . pramipexole  0.5 mg Oral QHS  . pravastatin  40 mg Oral Daily  . tamsulosin  0.4 mg Oral QPC supper   Continuous Infusions: . sodium chloride 75 mL/hr at 11/28/14 2338     Jc Veron, DO  [facility] Pager [phone]  If 7PM-7AM, please contact night-coverage [email or web address] Password [email or web address] 11/29/2014, 5:58 PM   LOS: 1 day

## 2014-11-29 NOTE — Progress Notes (Signed)
Hypoglycemic event:  CBG: 50 at 2303 Symptoms: none Treatment: 8 Oz cranberry juice Repeat CBG: 131 at 2339

## 2014-11-29 NOTE — Progress Notes (Signed)
*  PRELIMINARY RESULTS* Vascular Ultrasound Carotid Duplex (Doppler) has been completed.  Preliminary findings: Bilateral:  1-39% ICA stenosis.  Vertebral artery flow is antegrade, although the right vertebral is atypical with loss of diastolic flow.    Landry Mellow, RDMS, RVT  11/29/2014, 9:23 AM

## 2014-11-29 NOTE — Progress Notes (Signed)
Patient is back from [place].

## 2014-11-29 NOTE — Evaluation (Signed)
Physical Therapy Evaluation Patient Details Name: Javahn Muccio MRN: [id number] DOB: 1924-06-17 Today's Date: 11/29/2014   History of Present Illness  81 y.o. male with a history of dm, htn who presents with slurred speech and increased falls. Admitted to ED the day prior to this admission for a distal phalanx great toe fracture on the left.  Clinical Impression  Pt admitted with above complications. Pt currently with functional limitations due to the deficits listed below (see PT Problem List). Ambulating near baseline apparently, per wife today. Requires close guard assist however his stability is greatly improved with the use of a rolling walker. Safely completed stair training. Had extensive conversation with patient and wife regarding safe mobility at home with supervision from wife due to hx of falls. Both agree pt would benefit from a HHPT follow-up, in order to progress his functional independence and safety with mobility.  Will follow until d/c.     Follow Up Recommendations Home health PT;Supervision for mobility/OOB    Equipment Recommendations  None recommended by PT    Recommendations for Other Services       Precautions / Restrictions Precautions Precautions: Fall Required Braces or Orthoses: Other Brace/Splint Other Brace/Splint: fracture boot on Lt      Mobility  Bed Mobility Overal bed mobility: Modified Independent                Transfers Overall transfer level: Needs assistance Equipment used: Rolling walker (2 wheeled) Transfers: Sit to/from Stand Sit to Stand: Supervision         General transfer comment: Supervision for safety from lowest bed setting. VC for hand placement. Slow to rise. Instructed to not reach for RW until standing.  Ambulation/Gait Ambulation/Gait assistance: Min guard Ambulation Distance (Feet): 190 Feet Assistive device: Rolling walker (2 wheeled) Gait Pattern/deviations: Step-through pattern;Shuffle;Decreased stride  length;Drifts right/left Gait velocity: decreased   General Gait Details: Educated on safe DME use with min guard assist for safety. VC for walker control and placement for proximity. No assist needed to correct balance. shuffles feet at times, but apparently this is baseline. Cues for awareness to correct with better foot clearance while wearing boot.  Stairs Stairs: Yes Stairs assistance: Supervision Stair Management: Two rails;Step to pattern;Forwards Number of Stairs: 2 General stair comments: VC for sequencing, to lead with stronger LE on ascent, and lead with weaker LE on descent. Used both rails and did not need physical assist to perform safely.  Wheelchair Mobility    Modified Rankin (Stroke Patients Only) Modified Rankin (Stroke Patients Only) Pre-Morbid Rankin Score: Moderate disability Modified Rankin: Moderately severe disability     Balance Overall balance assessment: Needs assistance;History of Falls Sitting-balance support: No upper extremity supported;Feet supported Sitting balance-Leahy Scale: Good     Standing balance support: No upper extremity supported Standing balance-Leahy Scale: Fair                               Pertinent Vitals/Pain Pain Assessment: No/denies pain    Home Living Family/patient expects to be discharged to:: Private residence Living Arrangements: Spouse/significant other Available Help at Discharge: Family;Available 24 hours/day Type of Home: House Home Access: Stairs to enter Entrance Stairs-Rails: Psychiatric nurse of Steps: 3 Home Layout: One level Home Equipment: Walker - 2 wheels;Shower seat - built in;Grab bars - toilet;Grab bars - tub/shower;Cane - single point      Prior Function Level of Independence: Independent with assistive device(s)  Comments: sometimes uses a cane     Hand Dominance   Dominant Hand: Right    Extremity/Trunk Assessment   Upper Extremity Assessment:  Defer to OT evaluation           Lower Extremity Assessment: RLE deficits/detail;LLE deficits/detail (Lt great toe bandaged due to recent trauma) RLE Deficits / Details: strength WFL LLE Deficits / Details: Strength WFL     Communication   Communication: No difficulties  Cognition Arousal/Alertness: Awake/alert Behavior During Therapy: WFL for tasks assessed/performed Overall Cognitive Status: Within Functional Limits for tasks assessed                      General Comments General comments (skin integrity, edema, etc.): Discussed safe mobility and compliance with fracture boot, and RW use at home. Wife present and states she feels confident she can provide the required supervision needed to insure pt safety and prevent further falls at home. Agrees that a RW will provided much needed stability for patient and would greatly benefit from HHPT.    Exercises        Assessment/Plan    PT Assessment Patient needs continued PT services  PT Diagnosis Difficulty walking;Abnormality of gait   PT Problem List Decreased strength;Decreased activity tolerance;Decreased balance;Decreased mobility;Decreased knowledge of use of DME;Decreased knowledge of precautions  PT Treatment Interventions DME instruction;Gait training;Stair training;Functional mobility training;Therapeutic activities;Therapeutic exercise;Balance training;Patient/family education   PT Goals (Current goals can be found in the Care Plan section) Acute Rehab PT Goals Patient Stated Goal: Go home PT Goal Formulation: With patient Time For Goal Achievement: 12/13/14 Potential to Achieve Goals: Good    Frequency Min 3X/week   Barriers to discharge        Co-evaluation               End of Session Equipment Utilized During Treatment: Gait belt;Other (comment) (fracture boot LT) Activity Tolerance: Patient tolerated treatment well Patient left: in bed;with call bell/phone within reach;with family/visitor  present Nurse Communication: Mobility status;Other (comment) (fracture boot LLE)         Time: [id number] PT Time Calculation (min) (ACUTE ONLY): 33 min   Charges:   PT Evaluation $Initial PT Evaluation Tier I: 1 Procedure PT Treatments $Gait Training: 8-22 mins   PT G CodesEllouise Newer 11/29/2014, 3:22 PM Camille Bal Cora, [place]

## 2014-11-29 NOTE — Progress Notes (Signed)
Patient arrived from [facility] ED to 4N08 at 2205, alert and oriented. Patient oriented to room and equipment. Cardiac monitoring initiated. Patient follows commands and MAEW. Safety measures in place. Will monitor closely overnight.

## 2014-11-30 ENCOUNTER — Inpatient Hospital Stay (HOSPITAL_COMMUNITY): Payer: Medicare Other

## 2014-11-30 DIAGNOSIS — I639 Cerebral infarction, unspecified: Secondary | ICD-10-CM

## 2014-11-30 DIAGNOSIS — I10 Essential (primary) hypertension: Secondary | ICD-10-CM

## 2014-11-30 LAB — BASIC METABOLIC PANEL
Anion gap: 9 (ref 5–15)
BUN: 23 mg/dL — ABNORMAL HIGH (ref 6–20)
CALCIUM: 9.4 mg/dL (ref 8.9–10.3)
CO2: 29 mmol/L (ref 22–32)
Chloride: 100 mmol/L — ABNORMAL LOW (ref 101–111)
Creatinine, Ser: 1.39 mg/dL — ABNORMAL HIGH (ref 0.61–1.24)
GFR calc Af Amer: 51 mL/min — ABNORMAL LOW (ref >60)
GFR calc non Af Amer: 44 mL/min — ABNORMAL LOW (ref >60)
Glucose, Bld: 155 mg/dL — ABNORMAL HIGH (ref 70–99)
Potassium: 4.6 mmol/L (ref 3.5–5.1)
SODIUM: 138 mmol/L (ref 135–145)

## 2014-11-30 LAB — HEMOGLOBIN A1C
Hgb A1c MFr Bld: 7 % — ABNORMAL HIGH (ref 4.8–5.6)
MEAN PLASMA GLUCOSE: 154 mg/dL

## 2014-11-30 LAB — GLUCOSE, CAPILLARY
GLUCOSE-CAPILLARY: 168 mg/dL — AB (ref 70–99)
Glucose-Capillary: 166 mg/dL — ABNORMAL HIGH (ref 70–99)

## 2014-11-30 MED ORDER — METOPROLOL TARTRATE 25 MG PO TABS: 12.5000 mg | ORAL_TABLET | Freq: Two times a day (BID) | ORAL | Status: AC

## 2014-11-30 MED ORDER — ASPIRIN 325 MG PO TABS: 325.0000 mg | ORAL_TABLET | Freq: Every day | ORAL | Status: DC

## 2014-11-30 NOTE — Evaluation (Signed)
Speech Language Pathology Evaluation Patient Details Name: Kristopher Zimmerman MRN: [id number] DOB: [birth year] Today's Date: 11/30/2014 Time: [id number] SLP Time Calculation (min) (ACUTE ONLY): 11 min  Problem List:  Patient Active Problem List   Diagnosis Date Noted  . Stroke 11/28/2014  . CVA (cerebral infarction) 11/28/2014  . Diabetes mellitus 11/28/2014  . Chronic kidney disease (CKD) stage G2/A1, mildly decreased glomerular filtration rate (GFR) between 60-89 mL/min/1.73 square meter and albuminuria creatinine ratio less than 30 mg/g 11/28/2014  . Neuropathy in diabetes 11/28/2014  . Toe fracture, left 11/28/2014  . DM (diabetes mellitus) type II controlled with renal manifestation 11/28/2014  . Essential hypertension 05/27/2014  . Hyperlipidemia 05/27/2014  . Aortic stenosis 05/27/2014  . Abnormal electrocardiogram 05/27/2014  . Chest pain 05/27/2014   Past Medical History:  Past Medical History  Diagnosis Date  . Diabetes mellitus without complication   . Neuropathy, diabetic   . Hypertension   . Renal insufficiency    Past Surgical History:  Past Surgical History  Procedure Laterality Date  . Back surgery    . Broken leg    . Cervical disc surgery    . Tonsillectomy    . Appendectomy     HPI:  81 y.o. male has a past medical history of Diabetes mellitus, Neuropathy, diabetic; Hypertension,  Renal insufficiency, Bell's Plasy admitted with fall night of the 30th for and brought back to emergency department at 3 PM due to continued falls and was noted that was noted to have worsening slurred speech as well as right facial droop. MRI Acute sub cm infarction affecting left basal ganglia/ corona.    Assessment / Plan / Recommendation Clinical Impression  Pt assessed in the areas of working memory, verbal problem solving, awareness, attention and orientation. All areas appear within functional limits; no complaints of difficulty at onset or post CVA. Speech  is intelligible.  Wife is responsible for finances and pt reports she fills pill box. No ST needed.    SLP Assessment  Patient does not need any further Speech Lanaguage Pathology Services    Follow Up Recommendations  None    Frequency and Duration        Pertinent Vitals/Pain Pain Assessment: No/denies pain   SLP Goals     SLP Evaluation Prior Functioning  Cognitive/Linguistic Baseline: Within functional limits Type of Home: House  Lives With: Spouse Vocation: Retired   Associate Professor  Overall Cognitive Status: Within Functional Limits for tasks assessed Safety/Judgment: Appears intact    Comprehension  Auditory Comprehension Overall Auditory Comprehension: Appears within functional limits for tasks assessed Visual Recognition/Discrimination Discrimination: Not tested Reading Comprehension Reading Status: Not tested    Expression Expression Primary Mode of Expression: Verbal Verbal Expression Overall Verbal Expression: Appears within functional limits for tasks assessed Written Expression Dominant Hand: Right Written Expression: Not tested   Oral / Motor Oral Motor/Sensory Function Overall Oral Motor/Sensory Function: Impaired Labial ROM: Reduced right Labial Symmetry: Abnormal symmetry right Labial Strength: Within Functional Limits Labial Sensation: Within Functional Limits Lingual ROM: Within Functional Limits Lingual Symmetry: Within Functional Limits Lingual Strength: Within Functional Limits Lingual Sensation: Within Functional Limits Facial ROM: Within Functional Limits Facial Symmetry: Within Functional Limits Facial Strength: Within Functional Limits Facial Sensation: Within Functional Limits Mandible: Within Functional Limits Motor Speech Overall Motor Speech: Appears within functional limits for tasks assessed Intelligibility: Intelligible Motor Planning: Witnin functional limits   GO     Kristopher Zimmerman 11/30/2014, 1:04 PM   Kristopher Zimmerman Colvin Caroli.Ed  Engineer, agricultural  [phone]     

## 2014-11-30 NOTE — Progress Notes (Signed)
Physical Therapy Treatment Patient Details Name: Kristopher Zimmerman MRN: [id number] DOB: Jan 16, 1924 Today's Date: 11/30/2014    History of Present Illness 81 y.o. male with a history of dm, htn who presents with slurred speech and increased falls. Admitted to ED the day prior to this admission for a distal phalanx great toe fracture on the left.    PT Comments    Received in gym for stair negotiation and ambulation. Patient performed 5 steps with cues x2. Patient tolerated ambulation to room but did endorse fatigue. Cues for safety with use of RW at times. Current POC remains appropriate.  Follow Up Recommendations  Home health PT;Supervision for mobility/OOB     Equipment Recommendations  None recommended by PT    Recommendations for Other Services       Precautions / Restrictions Precautions Precautions: Fall Required Braces or Orthoses: Other Brace/Splint Other Brace/Splint: fracture boot on Lt    Mobility  Bed Mobility Overal bed mobility: Modified Independent                Transfers Overall transfer level: Needs assistance Equipment used: Rolling walker (2 wheeled) Transfers: Sit to/from Stand Sit to Stand: Supervision         General transfer comment: VCs for hand placement when coming to standing from lower surface  Ambulation/Gait Ambulation/Gait assistance: Supervision Ambulation Distance (Feet): 160 Feet Assistive device: Rolling walker (2 wheeled) Gait Pattern/deviations: Step-through pattern;Shuffle;Decreased stride length;Drifts right/left Gait velocity: decreased   General Gait Details: VCs for positioning within RW durina gait and turns   Stairs Stairs: Yes Stairs assistance: Supervision Stair Management: Two rails;Step to pattern;Forwards Number of Stairs: 5 (performed x2 with cues for sequencing) General stair comments: VCs for sequencing, no physical assist required  Wheelchair Mobility    Modified Rankin (Stroke Patients  Only) Modified Rankin (Stroke Patients Only) Pre-Morbid Rankin Score: Moderate disability Modified Rankin: Moderately severe disability     Balance Overall balance assessment: Needs assistance Sitting-balance support: No upper extremity supported;Feet supported Sitting balance-Leahy Scale: Good     Standing balance support: During functional activity Standing balance-Leahy Scale: Fair                      Cognition Arousal/Alertness: Awake/alert Behavior During Therapy: WFL for tasks assessed/performed Overall Cognitive Status: Within Functional Limits for tasks assessed                      Exercises      General Comments General comments (skin integrity, edema, etc.): pt self reports no sensation in feet and no pain from injury       Pertinent Vitals/Pain Pain Assessment: No/denies pain    Home Living Family/patient expects to be discharged to:: Private residence Living Arrangements: Spouse/significant other Available Help at Discharge: Family;Available 24 hours/day Type of Home: House Home Access: Stairs to enter Entrance Stairs-Rails: Right;Left Home Layout: One level Home Equipment: Environmental consultant - 2 wheels;Shower seat - built in;Grab bars - toilet;Grab bars - tub/shower;Cane - single point      Prior Function Level of Independence: Independent with assistive device(s)      Comments: sometimes uses a cane   PT Goals (current goals can now be found in the care plan section) Acute Rehab PT Goals Patient Stated Goal: Go home PT Goal Formulation: With patient Time For Goal Achievement: 12/13/14 Potential to Achieve Goals: Good Progress towards PT goals: Progressing toward goals    Frequency  Min 3X/week    PT Plan  Current plan remains appropriate    Co-evaluation             End of Session Equipment Utilized During Treatment: Other (comment) (fracture boot LT) Activity Tolerance: Patient tolerated treatment well Patient left: in  bed;with call bell/phone within reach;with family/visitor present     Time: 0821-0835 PT Time Calculation (min) (ACUTE ONLY): 14 min  Charges:  $Gait Training: 8-22 mins                    G CodesDuncan Dull 2014-12-05, 10:22 AM Alben Deeds, PT DPT  [phone]

## 2014-11-30 NOTE — Evaluation (Signed)
Occupational Therapy Evaluation/ DC Patient Details Name: Kristopher Zimmerman MRN: [id number] DOB: 08-19-1923 Today's Date: 11/30/2014    History of Present Illness 81 y.o. male with a history of dm, htn who presents with slurred speech and increased falls. Admitted to ED the day prior to this admission for a distal phalanx great toe fracture on the left.   Clinical Impression   Patient evaluated by Occupational Therapy with no further acute OT needs identified. All education has been completed and the patient has no further questions. See below for any follow-up Occupational Therapy or equipment needs. OT to sign off. Thank you for referral.      Follow Up Recommendations  No OT follow up    Equipment Recommendations  None recommended by OT    Recommendations for Other Services       Precautions / Restrictions Precautions Precautions: Fall Required Braces or Orthoses: Other Brace/Splint Other Brace/Splint: fracture boot on Lt      Mobility Bed Mobility Overal bed mobility: Modified Independent                Transfers Overall transfer level: Needs assistance Equipment used: Rolling walker (2 wheeled) Transfers: Sit to/from Stand Sit to Stand: Supervision         General transfer comment: Mod v/c for hand placement and safety. Pt pulling on RW handles and then pulling on center of RW educated on hand placement and excellent return demo    Balance Overall balance assessment: Needs assistance Sitting-balance support: No upper extremity supported;Feet supported Sitting balance-Leahy Scale: Good     Standing balance support: During functional activity;No upper extremity supported Standing balance-Leahy Scale: Fair                              ADL Overall ADL's : At baseline                                       General ADL Comments: Pt has wife (A) to don L boot and shoes. Pt's wife has completed this task even prior to L foot  injury. Pt completed ADLs just prior to OT arrival and RN tech reports pt was able to complete with stand by assistance. Pt reports feeling at baseline. OT simulated shower transfer with patient and able to complete supervision     Vision     Perception     Praxis      Pertinent Vitals/Pain Pain Assessment: No/denies pain     Hand Dominance Right   Extremity/Trunk Assessment Upper Extremity Assessment Upper Extremity Assessment: Overall WFL for tasks assessed   Lower Extremity Assessment Lower Extremity Assessment: Defer to PT evaluation   Cervical / Trunk Assessment Cervical / Trunk Assessment: Normal   Communication Communication Communication: No difficulties   Cognition Arousal/Alertness: Awake/alert Behavior During Therapy: WFL for tasks assessed/performed Overall Cognitive Status: Within Functional Limits for tasks assessed                     General Comments       Exercises       Shoulder Instructions      Home Living Family/patient expects to be discharged to:: Private residence Living Arrangements: Spouse/significant other Available Help at Discharge: Family;Available 24 hours/day Type of Home: House Home Access: Stairs to enter [employer] of Steps: 3 Entrance Stairs-Rails: Right;Left Home  Layout: One level     Bathroom Shower/Tub: Teacher, early years/pre: Standard     Home Equipment: Environmental consultant - 2 wheels;Shower seat - built in;Grab bars - toilet;Grab bars - tub/shower;Cane - single point          Prior Functioning/Environment Level of Independence: Independent with assistive device(s)        Comments: sometimes uses a cane    OT Diagnosis:     OT Problem List:     OT Treatment/Interventions:      OT Goals(Current goals can be found in the care plan section) Acute Rehab OT Goals Patient Stated Goal: return home to work in [place] with 2 dogs  OT Frequency:     Barriers to D/C:             Co-evaluation              End of Session Equipment Utilized During Treatment: Gait belt;Rolling walker Nurse Communication: Mobility status;Precautions  Activity Tolerance: Patient tolerated treatment well Patient left: Other (comment) (left with PT [facility])   [id number] OT Time Calculation (min): 17 min Charges:  OT General Charges $OT Visit: 1 Procedure OT Evaluation $Initial OT Evaluation Tier I: 1 Procedure G-Codes:    Peri Maris 12-21-2014, 8:57 AM Pager: [phone]

## 2014-11-30 NOTE — Progress Notes (Signed)
  Echocardiogram 2D Echocardiogram has been performed.  Kristopher Zimmerman 11/30/2014, 9:49 AM

## 2014-11-30 NOTE — Progress Notes (Signed)
UR complete.  Breylon Sherrow RN, MSN 

## 2014-11-30 NOTE — Progress Notes (Signed)
CARE MANAGEMENT NOTE 11/30/2014  Patient:  Kristopher Zimmerman,Kristopher Zimmerman   Account Number:  [id number]  Date Initiated:  11/30/2014  Documentation initiated by:  Lorne Skeens  Subjective/Objective Assessment:   patient was admitted with CVA. Lives at home with spouse.     Action/Plan:   Will follow for discharge needs pending PT/OT evals and physician orders.   Anticipated DC Date:  12/01/2014   Anticipated DC Plan:  [place]         Choice offered to / List presented to:             Status of service:  In process, will continue to follow Medicare Important Message given?   (If response is "NO", the following Medicare IM given date fields will be blank) Date Medicare IM given:   Medicare IM given by:   Date Additional Medicare IM given:   Additional Medicare IM given by:    Discharge Disposition:    Per UR Regulation:  Reviewed for med. necessity/level of care/duration of stay  If discussed at [place] of Stay Meetings, dates discussed:    Comments:

## 2014-11-30 NOTE — Discharge Summary (Addendum)
Physician Discharge Summary  Kristopher Zimmerman [id number] DOB: 03-Sep-1923 DOA: 11/28/2014  PCP: Merrilee Seashore, MD  Admit date: 11/28/2014 Discharge date: 11/30/2014  Recommendations for Outpatient Follow-up:  1. Pt will need to follow up with PCP in 2 weeks post discharge 2. Please obtain BMP in 1-2 weeks 3. Please follow up on results on the echocardiogram  Discharge Diagnoses:  Left basal ganglia stroke -11/28/2014 MRI brain confirms left basal ganglia and corona radiata infarction -Carotid duplex negative for hemodynamically significant stenosis -Echocardiogram results pending-- -LDL 43 -Hemoglobin A1c--7.0 -PT-->recommends supervision for transfers OOB -ST-->regular diet -Patient was cleared for discharge by neurology -Continue aspirin 325 mg daily at discharge CKD stage III -Based on creatinine 1.3-1.4 Diabetes mellitus type 2 -Hemoglobin A1c--7.0 -NovoLog sliding scale -Hold glyburide and metformin during the hospitalization his oral agents can be restarted after discharge, but his renal function will need to be monitored closely on metformin -His diabetic regimen may need to adjust for optimal glycemic control in the outpatient setting. Hypertension -Restart Avapro -Restart HCTZ after discharge  -Metoprolol tartrate 12.5 mg twice a day started due to poorly controlled blood pressure  Laceration of the foot with fracture of the distal phalanx of the left first digit  -Continue orthotic boot when ambulating  -Patient has an appointment with Ortho, Dr. Erlinda Hong 12/01/14 -changed dressing during hospitalization by ortho tech -continue cephalexin prophylactically per ortho until pt is seen in office Hyperlipidemia  -Continue statin   Discharge Condition: stable  Disposition: home  Diet:heart healthy Wt Readings from Last 3 Encounters:  11/28/14 92.715 kg (204 lb 6.4 oz)  07/03/14 94.031 kg (207 lb 4.8 oz)  06/11/14 92.534 kg (204 lb)    History of present  illness:  81 year old male with history of diabetes mellitus, hypertension, CKD stage III presented with slurred speech and frequent falls. The patient had a mechanical fall earlier on the day of admission. The patient was sent home with an orthotic shoe after he was noted to have a distal phalanx fracture of the left first digit.Marland Kitchen Unfortunately, the patient developed slurred speech with facial droop and came back Emergency department. CT of the brain was negative, but MRI of the brain showed an acute infarction in the left basal ganglia and corona radiata. Neurology was consulted and recommended stroke Workup.  Consultants: Neurology  Discharge Exam: Filed Vitals:   11/30/14 1358  BP: 180/92  Pulse: 90  Temp: 98.1 F (36.7 C)  Resp: 18   Filed Vitals:   11/30/14 0050 11/30/14 0512 11/30/14 0956 11/30/14 1358  BP: 185/83 147/73 178/85 180/92  Pulse: 108 97 85 90  Temp: 97.9 F (36.6 C) 98.1 F (36.7 C) 97.9 F (36.6 C) 98.1 F (36.7 C)  TempSrc: Oral Oral Axillary Axillary  Resp: 18 18 18 18   Height:      Weight:      SpO2: 96% 97% 97% 97%   General: A&O x 3, NAD, pleasant, cooperative Cardiovascular: RRR, no rub, no gallop, no S3 Respiratory: CTAB, no wheeze, no rhonchi Abdomen:soft, nontender, nondistended, positive bowel sounds Extremities: trace LE edema, No lymphangitis, no petechiae  Discharge Instructions     Medication List    STOP taking these medications        aspirin EC 81 MG tablet  Replaced by:  aspirin 325 MG tablet     meloxicam 15 MG tablet  Commonly known as:  MOBIC      TAKE these medications        aspirin 325 MG tablet  Take 1 tablet (325 mg total) by mouth daily.     B-complex with vitamin C tablet  Take 1 tablet by mouth daily.     calcium carbonate 600 MG Tabs tablet  Commonly known as:  OS-CAL  Take 600 mg by mouth daily with breakfast.     cephALEXin 500 MG capsule  Commonly known as:  KEFLEX  Take 1 capsule (500 mg total) by  mouth 2 (two) times daily.     cyclobenzaprine 10 MG tablet  Commonly known as:  FLEXERIL  Take 10 mg by mouth at bedtime.     doxycycline 100 MG tablet  Commonly known as:  VIBRA-TABS  Take 100 mg by mouth daily.     DULoxetine 30 MG capsule  Commonly known as:  CYMBALTA  Take 30 mg by mouth daily. Take with a 60 to make 90     DULoxetine 60 MG capsule  Commonly known as:  CYMBALTA  Take 60 mg by mouth daily. Take with 30 to make 90     glyBURIDE-metformin 2.5-500 MG per tablet  Commonly known as:  GLUCOVANCE  Take 2 tablets by mouth daily with breakfast. 2 tabs at breakfast and 1 at Supper     hydrochlorothiazide 25 MG tablet  Commonly known as:  HYDRODIURIL  Take 25 mg by mouth daily.     irbesartan 150 MG tablet  Commonly known as:  AVAPRO  Take 150 mg by mouth daily.     LORazepam 2 MG tablet  Commonly known as:  ATIVAN  Take 2 mg by mouth at bedtime.     Magnesium Citrate 100 MG Tabs  Take 1 tablet by mouth daily.     metoprolol tartrate 25 MG tablet  Commonly known as:  LOPRESSOR  Take 0.5 tablets (12.5 mg total) by mouth 2 (two) times daily.     pramipexole 0.5 MG tablet  Commonly known as:  MIRAPEX  Take 0.5 mg by mouth at bedtime.     pravastatin 40 MG tablet  Commonly known as:  PRAVACHOL  Take 40 mg by mouth daily.     REFRESH LIQUIGEL OP  Apply 1 drop to eye 4 (four) times daily.     SYSTANE BALANCE 0.6 % Soln  Generic drug:  Propylene Glycol  Apply 1 drop to eye 4 (four) times daily.     tamsulosin 0.4 MG Caps capsule  Commonly known as:  FLOMAX  Take by mouth.     vitamin C 500 MG tablet  Commonly known as:  ASCORBIC ACID  Take 500 mg by mouth daily.         The results of significant diagnostics from this hospitalization (including imaging, microbiology, ancillary and laboratory) are listed below for reference.    Significant Diagnostic Studies: Dg Chest 2 View  11/29/2014   CLINICAL DATA:  Status post cerebrovascular accident.  Initial encounter.  EXAM: CHEST  2 VIEW  COMPARISON:  Chest radiograph performed 02/14/2005  FINDINGS: The lungs are well-aerated. Mild left basilar atelectasis is noted. Peribronchial thickening is noted. There is no evidence of pleural effusion or pneumothorax.  The heart is normal in size; the mediastinal contour is within normal limits. No acute osseous abnormalities are seen.  IMPRESSION: Mild left basilar atelectasis noted.  Peribronchial thickening seen.   Electronically Signed   By: Garald Balding M.D.   On: 11/29/2014 03:54   Ct Head Wo Contrast  11/28/2014   CLINICAL DATA:  One day history of slurred speech  EXAM:  CT HEAD WITHOUT CONTRAST  TECHNIQUE: Contiguous axial images were obtained from the base of the skull through the vertex without intravenous contrast.  COMPARISON:  Head CT Dec 21, 2012 and brain MRI January 03, 2013  FINDINGS: There is mild diffuse atrophy. There is no intracranial mass, hemorrhage, extra-axial fluid collection, or midline shift. There is patchy small vessel disease in the centra semiovale bilaterally. There are no new gray-white compartment lesions. No acute infarct apparent. Bony calvarium appears intact. The mastoid air cells are clear. Note that the mastoids on the right are somewhat hypoplastic.  IMPRESSION: Atrophy with periventricular small vessel disease. No intracranial mass, hemorrhage, or acute appearing infarct.   Electronically Signed   By: Lowella Grip III M.D.   On: 11/28/2014 15:47   Mr Brain Wo Contrast  11/28/2014   CLINICAL DATA:  Right-sided facial droop beginning yesterday. Slurred speech.  EXAM: MRI HEAD WITHOUT CONTRAST  TECHNIQUE: Multiplanar, multiecho pulse sequences of the brain and surrounding structures were obtained without intravenous contrast.  COMPARISON:  Head CT same day.  MRI 01/03/2013.  FINDINGS: There is a sub cm region of acute infarction in the right basal ganglia and corona radiata. No other acute infarction.  The brainstem and  cerebellum are unremarkable. There chronic small-vessel ischemic changes affecting the cerebral deep and subcortical white matter. No large vessel territory infarction. No mass lesion, hemorrhage, hydrocephalus or extra-axial collection. No pituitary mass. No inflammatory sinus disease. No skull or skullbase lesion.  IMPRESSION: Acute sub cm infarction affecting left basal ganglia/ corona radiata. No swelling or hemorrhage.  Chronic small vessel ischemic changes elsewhere throughout the brain.   Electronically Signed   By: Nelson Chimes M.D.   On: 11/28/2014 19:03   Dg Toe Great Left  11/28/2014   CLINICAL DATA:  Initial encounter for stubbed toe at home today. Bleeding.  EXAM: LEFT GREAT TOE  COMPARISON:  None.  FINDINGS: Soft tissue bandage obscures detail. Fracture involving the dorsalaspect of the proximal most portion of the distal phalanx of the great toe. Intra-articular extension. This has both medial and lateral components.  Artifactual lucency through the proximal phalanx of the second digit.  IMPRESSION: Intra-articular fracture involving the proximal portion of the distal phalanx of the first digit.   Electronically Signed   By: Abigail Miyamoto M.D.   On: 11/28/2014 02:24     Microbiology: No results found for this or any previous visit (from the past 240 hour(s)).   Labs: Basic Metabolic Panel:  Recent Labs Lab 11/28/14 1520 11/28/14 1528 11/30/14 0710  NA 139 136 138  K 5.0 4.9 4.6  CL 103 101 100*  CO2 28  --  29  GLUCOSE 86 81 155*  BUN 43* 45* 23*  CREATININE 1.43* 1.70* 1.39*  CALCIUM 9.9  --  9.4   Liver Function Tests:  Recent Labs Lab 11/28/14 1520  AST 24  ALT 23  ALKPHOS 69  BILITOT 0.7  PROT 6.6  ALBUMIN 3.8   No results for input(s): LIPASE, AMYLASE in the last 168 hours. No results for input(s): AMMONIA in the last 168 hours. CBC:  Recent Labs Lab 11/28/14 1520 11/28/14 1528  WBC 7.3  --   NEUTROABS 4.7  --   HGB 11.1* 11.6*  HCT 34.3* 34.0*    MCV 95.3  --   PLT 207  --    Cardiac Enzymes:  Recent Labs Lab 11/28/14 0002 11/29/14 0531 11/29/14 1122  TROPONINI <0.03 <0.03 <0.03   BNP: Invalid input(s):  POCBNP CBG:  Recent Labs Lab 11/29/14 1114 11/29/14 1614 11/29/14 2153 11/30/14 0630 11/30/14 1149  GLUCAP 159* 161* 124* 168* 166*    Time coordinating discharge:  Greater than 30 minutes  Signed:  Tanette Chauca, DO [facility] Pager: [phone] 11/30/2014, 6:29 PM

## 2014-11-30 NOTE — Progress Notes (Signed)
Discharge instructions reviewed with patient and his wife.  D/C medications reviewed as well as follow up appointments. Both voice understanding to teaching.  To door via wheelchair.  Home via [place] with his wife driving

## 2014-11-30 NOTE — Progress Notes (Signed)
Stroke Team Progress Note  HISTORY 81 y.o. male with a history of dm, htn who presents with slurred speech and increased falls since yesterday. He injured his toe fairly badly yesterday and was in the ER. He had taken his evening medications prior to coming in, so his wife did not think anything of the slurred speech and it was not mentioned to ER staff.   SUBJECTIVE Patient states he is doing much better. He has been seen by therapy and likely go home later today after his results are back  OBJECTIVE Most recent Vital Signs: Filed Vitals:   11/29/14 2049 11/30/14 0050 11/30/14 0512 11/30/14 0956  BP: 159/69 185/83 147/73 178/85  Pulse: 90 108 97 85  Temp: 98.1 F (36.7 C) 97.9 F (36.6 C) 98.1 F (36.7 C) 97.9 F (36.6 C)  TempSrc: Oral Oral Oral Axillary  Resp: 18 18 18 18   Height:      Weight:      SpO2: 95% 96% 97% 97%   CBG (last 3)   Recent Labs  11/29/14 1614 11/29/14 2153 11/30/14 0630  GLUCAP 161* 124* 168*    IV Fluid Intake:   . sodium chloride 75 mL/hr at 11/28/14 2338    MEDICATIONS  . aspirin  300 mg Rectal Daily   Or  . aspirin  325 mg Oral Daily  . cephALEXin  500 mg Oral BID  . cyclobenzaprine  10 mg Oral QHS  . doxycycline  100 mg Oral Daily  . DULoxetine  90 mg Oral Daily  . insulin aspart  0-9 Units Subcutaneous TID WC  . irbesartan  150 mg Oral Daily  . pramipexole  0.5 mg Oral QHS  . pravastatin  40 mg Oral Daily  . tamsulosin  0.4 mg Oral QPC supper   PRN:  acetaminophen **OR** acetaminophen, LORazepam, senna-docusate  Diet:  Diet heart healthy/carb modified Room service appropriate?: Yes; Fluid consistency:: Thin   Activity: Up with assistance DVT Prophylaxis:  SCDs/heparin sq   CLINICALLY SIGNIFICANT STUDIES Basic Metabolic Panel:   Recent Labs Lab 11/28/14 1520 11/28/14 1528 11/30/14 0710  NA 139 136 138  K 5.0 4.9 4.6  CL 103 101 100*  CO2 28  --  29  GLUCOSE 86 81 155*  BUN 43* 45* 23*  CREATININE 1.43* 1.70* 1.39*   CALCIUM 9.9  --  9.4   Liver Function Tests:   Recent Labs Lab 11/28/14 1520  AST 24  ALT 23  ALKPHOS 69  BILITOT 0.7  PROT 6.6  ALBUMIN 3.8   CBC:   Recent Labs Lab 11/28/14 1520 11/28/14 1528  WBC 7.3  --   NEUTROABS 4.7  --   HGB 11.1* 11.6*  HCT 34.3* 34.0*  MCV 95.3  --   PLT 207  --    Coagulation:   Recent Labs Lab 11/28/14 1520  LABPROT 12.2  INR 0.90   Cardiac Enzymes:   Recent Labs Lab 11/28/14 0002 11/29/14 0531 11/29/14 1122  TROPONINI <0.03 <0.03 <0.03   Urinalysis:   Recent Labs Lab 11/28/14 1701  COLORURINE YELLOW  LABSPEC 1.013  PHURINE 6.0  GLUCOSEU NEGATIVE  HGBUR NEGATIVE  BILIRUBINUR NEGATIVE  KETONESUR NEGATIVE  PROTEINUR NEGATIVE  UROBILINOGEN 0.2  NITRITE NEGATIVE  LEUKOCYTESUR NEGATIVE   Lipid Panel    Component Value Date/Time   CHOL 109 11/29/2014 0531   TRIG 83 11/29/2014 0531   HDL 49 11/29/2014 0531   CHOLHDL 2.2 11/29/2014 0531   VLDL 17 11/29/2014 0531   LDLCALC  43 11/29/2014 0531   HgbA1C  Lab Results  Component Value Date   HGBA1C 7.0* 11/29/2014    Urine Drug Screen:      Component Value Date/Time   LABOPIA POSITIVE* 11/28/2014 1700   COCAINSCRNUR NONE DETECTED 11/28/2014 1700   LABBENZ POSITIVE* 11/28/2014 1700   AMPHETMU NONE DETECTED 11/28/2014 1700   THCU NONE DETECTED 11/28/2014 1700   LABBARB NONE DETECTED 11/28/2014 1700    Alcohol Level:   Recent Labs Lab 11/28/14 1520  ETH <5    Dg Chest 2 View  11/29/2014   CLINICAL DATA:  Status post cerebrovascular accident. Initial encounter.  EXAM: CHEST  2 VIEW  COMPARISON:  Chest radiograph performed 02/14/2005  FINDINGS: The lungs are well-aerated. Mild left basilar atelectasis is noted. Peribronchial thickening is noted. There is no evidence of pleural effusion or pneumothorax.  The heart is normal in size; the mediastinal contour is within normal limits. No acute osseous abnormalities are seen.  IMPRESSION: Mild left basilar  atelectasis noted.  Peribronchial thickening seen.   Electronically Signed   By: Garald Balding M.D.   On: 11/29/2014 03:54   Ct Head Wo Contrast  11/28/2014   CLINICAL DATA:  One day history of slurred speech  EXAM: CT HEAD WITHOUT CONTRAST  TECHNIQUE: Contiguous axial images were obtained from the base of the skull through the vertex without intravenous contrast.  COMPARISON:  Head CT Dec 21, 2012 and brain MRI January 03, 2013  FINDINGS: There is mild diffuse atrophy. There is no intracranial mass, hemorrhage, extra-axial fluid collection, or midline shift. There is patchy small vessel disease in the centra semiovale bilaterally. There are no new gray-white compartment lesions. No acute infarct apparent. Bony calvarium appears intact. The mastoid air cells are clear. Note that the mastoids on the right are somewhat hypoplastic.  IMPRESSION: Atrophy with periventricular small vessel disease. No intracranial mass, hemorrhage, or acute appearing infarct.   Electronically Signed   By: Lowella Grip III M.D.   On: 11/28/2014 15:47   Mr Brain Wo Contrast  11/28/2014   CLINICAL DATA:  Right-sided facial droop beginning yesterday. Slurred speech.  EXAM: MRI HEAD WITHOUT CONTRAST  TECHNIQUE: Multiplanar, multiecho pulse sequences of the brain and surrounding structures were obtained without intravenous contrast.  COMPARISON:  Head CT same day.  MRI 01/03/2013.  FINDINGS: There is a sub cm region of acute infarction in the right basal ganglia and corona radiata. No other acute infarction.  The brainstem and cerebellum are unremarkable. There chronic small-vessel ischemic changes affecting the cerebral deep and subcortical white matter. No large vessel territory infarction. No mass lesion, hemorrhage, hydrocephalus or extra-axial collection. No pituitary mass. No inflammatory sinus disease. No skull or skullbase lesion.  IMPRESSION: Acute sub cm infarction affecting left basal ganglia/ corona radiata. No swelling or  hemorrhage.  Chronic small vessel ischemic changes elsewhere throughout the brain.   Electronically Signed   By: Nelson Chimes M.D.   On: 11/28/2014 19:03    CT of the brain  Atrophy with periventricular small vessel disease. No intracranial mass, hemorrhage, or acute appearing infarct.  MRI of the brain  Acute sub cm infarction affecting left basal ganglia/ corona radiata. No swelling or hemorrhage.  MRA of the brain  Not ordered  Carotid Doppler   - The vertebral arteries appear patent with antegrade flow, although the right vertebral is atypical with loss of diastolicflow, suggesting a distal obstruction. - Findings consistent with 1-39 percent stenosis involving the right internal carotid artery  and the left internal carotidartery.  2D Echocardiogram  Done 11/15, 2D in process this admission    EKG  normal EKG, normal sinus rhythm, unchanged from previous tracings. For complete results please see formal report.   Therapy Recommendations HH PT, no PT  Physical Exam   Constitutional: Appears well-developed and well-nourished.  Psych: Affect appropriate to situation Eyes: No scleral injection HENT: No OP obstrucion Head: Normocephalic.  Cardiovascular: Normal rate and regular rhythm.  Respiratory: Effort normal and breath sounds normal to anterior ascultation GI: Soft. No distension. There is no tenderness.  Skin: WDI  Neuro: Mental Status: Patient is awake, alert, interactive and appropriate Patient is able to give a clear and coherent history. No signs of aphasia or neglect Cranial Nerves: II: Visual Fields are full. Pupils are equal, round, and reactive to light.  III,IV, VI: EOMI without ptosis or diploplia.  V: Facial sensation is symmetric to temperature VII: Facial movement is notable for right droop VIII: hearing is intact to voice X: Uvula elevates symmetrically XI: Shoulder shrug is symmetric. XII: tongue is midline without atrophy or fasciculations.   Motor: Tone is normal. Bulk is normal. 5/5 strength was present in all four extremities.  Sensory: Sensation is symmetric to light touch and temperature in the arms and legs. Cerebellar: FNF intact bilaterally  ASSESSMENT [name] is a 81 y.o. male small subcortical stroke. High functioning for age at baseline. Currently back to baseline. No new complaints.     on ASA 81mg  at home prior to admission    Hospital day # 2  TREATMENT/PLAN  ASA 325 increased to daily  Home health PT I have personally examined this patient, reviewed notes, independently viewed imaging studies, participated in medical decision making and plan of care. I have made any additions or clarifications directly to the above note. Agree with note above.  I spoke to the patient and his daughter at the bedside and answered questions. Plan to DC patient later home today. Follow-up as an outpatient in stroke clinic in 2 months. Antony Contras, MD Medical Director [facility] Pager: [phone] 11/30/2014 4:37 PM       To contact Stroke Continuity provider, please refer to [email or web address] After hours, contact General Neurology

## 2014-12-01 DIAGNOSIS — S92425B Nondisplaced fracture of distal phalanx of left great toe, initial encounter for open fracture: Secondary | ICD-10-CM | POA: Diagnosis not present

## 2014-12-01 NOTE — Progress Notes (Signed)
Received a call from patient's wife stating that they were waiting to speak to someone regarding home health services.  CM reviewed chart, but no orders had been placed prior to discharge.  CM spoke with Burnetta Sabin, NP, who ordered home health PT.  CM spoke with patient's wife, who has chosen [facility]. Miranda with [facility] was notified and has accepted the referral.  Patient's address and phone were verified and are correct in the system.  The preferred contact number is the cell phon [phone].

## 2014-12-02 DIAGNOSIS — E1142 Type 2 diabetes mellitus with diabetic polyneuropathy: Secondary | ICD-10-CM | POA: Diagnosis not present

## 2014-12-02 DIAGNOSIS — I69393 Ataxia following cerebral infarction: Secondary | ICD-10-CM | POA: Diagnosis not present

## 2014-12-02 DIAGNOSIS — E1165 Type 2 diabetes mellitus with hyperglycemia: Secondary | ICD-10-CM | POA: Diagnosis not present

## 2014-12-02 DIAGNOSIS — I129 Hypertensive chronic kidney disease with stage 1 through stage 4 chronic kidney disease, or unspecified chronic kidney disease: Secondary | ICD-10-CM | POA: Diagnosis not present

## 2014-12-02 DIAGNOSIS — E785 Hyperlipidemia, unspecified: Secondary | ICD-10-CM | POA: Diagnosis not present

## 2014-12-02 DIAGNOSIS — S91112D Laceration without foreign body of left great toe without damage to nail, subsequent encounter: Secondary | ICD-10-CM | POA: Diagnosis not present

## 2014-12-02 DIAGNOSIS — S92402D Displaced unspecified fracture of left great toe, subsequent encounter for fracture with routine healing: Secondary | ICD-10-CM | POA: Diagnosis not present

## 2014-12-02 DIAGNOSIS — Z9181 History of falling: Secondary | ICD-10-CM | POA: Diagnosis not present

## 2014-12-02 DIAGNOSIS — N183 Chronic kidney disease, stage 3 (moderate): Secondary | ICD-10-CM | POA: Diagnosis not present

## 2014-12-03 DIAGNOSIS — I69393 Ataxia following cerebral infarction: Secondary | ICD-10-CM | POA: Diagnosis not present

## 2014-12-03 DIAGNOSIS — E1165 Type 2 diabetes mellitus with hyperglycemia: Secondary | ICD-10-CM | POA: Diagnosis not present

## 2014-12-03 DIAGNOSIS — S91112D Laceration without foreign body of left great toe without damage to nail, subsequent encounter: Secondary | ICD-10-CM | POA: Diagnosis not present

## 2014-12-03 DIAGNOSIS — I129 Hypertensive chronic kidney disease with stage 1 through stage 4 chronic kidney disease, or unspecified chronic kidney disease: Secondary | ICD-10-CM | POA: Diagnosis not present

## 2014-12-03 DIAGNOSIS — E1142 Type 2 diabetes mellitus with diabetic polyneuropathy: Secondary | ICD-10-CM | POA: Diagnosis not present

## 2014-12-03 DIAGNOSIS — S92402D Displaced unspecified fracture of left great toe, subsequent encounter for fracture with routine healing: Secondary | ICD-10-CM | POA: Diagnosis not present

## 2014-12-07 DIAGNOSIS — E1165 Type 2 diabetes mellitus with hyperglycemia: Secondary | ICD-10-CM | POA: Diagnosis not present

## 2014-12-07 DIAGNOSIS — I69393 Ataxia following cerebral infarction: Secondary | ICD-10-CM | POA: Diagnosis not present

## 2014-12-07 DIAGNOSIS — E1142 Type 2 diabetes mellitus with diabetic polyneuropathy: Secondary | ICD-10-CM | POA: Diagnosis not present

## 2014-12-07 DIAGNOSIS — I129 Hypertensive chronic kidney disease with stage 1 through stage 4 chronic kidney disease, or unspecified chronic kidney disease: Secondary | ICD-10-CM | POA: Diagnosis not present

## 2014-12-07 DIAGNOSIS — S92402D Displaced unspecified fracture of left great toe, subsequent encounter for fracture with routine healing: Secondary | ICD-10-CM | POA: Diagnosis not present

## 2014-12-07 DIAGNOSIS — S91112D Laceration without foreign body of left great toe without damage to nail, subsequent encounter: Secondary | ICD-10-CM | POA: Diagnosis not present

## 2014-12-08 DIAGNOSIS — E782 Mixed hyperlipidemia: Secondary | ICD-10-CM | POA: Diagnosis not present

## 2014-12-08 DIAGNOSIS — E1165 Type 2 diabetes mellitus with hyperglycemia: Secondary | ICD-10-CM | POA: Diagnosis not present

## 2014-12-08 DIAGNOSIS — I10 Essential (primary) hypertension: Secondary | ICD-10-CM | POA: Diagnosis not present

## 2014-12-09 DIAGNOSIS — E1142 Type 2 diabetes mellitus with diabetic polyneuropathy: Secondary | ICD-10-CM | POA: Diagnosis not present

## 2014-12-09 DIAGNOSIS — E1165 Type 2 diabetes mellitus with hyperglycemia: Secondary | ICD-10-CM | POA: Diagnosis not present

## 2014-12-09 DIAGNOSIS — S92402D Displaced unspecified fracture of left great toe, subsequent encounter for fracture with routine healing: Secondary | ICD-10-CM | POA: Diagnosis not present

## 2014-12-09 DIAGNOSIS — I129 Hypertensive chronic kidney disease with stage 1 through stage 4 chronic kidney disease, or unspecified chronic kidney disease: Secondary | ICD-10-CM | POA: Diagnosis not present

## 2014-12-09 DIAGNOSIS — I69393 Ataxia following cerebral infarction: Secondary | ICD-10-CM | POA: Diagnosis not present

## 2014-12-09 DIAGNOSIS — S91112D Laceration without foreign body of left great toe without damage to nail, subsequent encounter: Secondary | ICD-10-CM | POA: Diagnosis not present

## 2014-12-10 DIAGNOSIS — I69393 Ataxia following cerebral infarction: Secondary | ICD-10-CM | POA: Diagnosis not present

## 2014-12-10 DIAGNOSIS — E1165 Type 2 diabetes mellitus with hyperglycemia: Secondary | ICD-10-CM | POA: Diagnosis not present

## 2014-12-10 DIAGNOSIS — E1142 Type 2 diabetes mellitus with diabetic polyneuropathy: Secondary | ICD-10-CM | POA: Diagnosis not present

## 2014-12-10 DIAGNOSIS — S91112D Laceration without foreign body of left great toe without damage to nail, subsequent encounter: Secondary | ICD-10-CM | POA: Diagnosis not present

## 2014-12-10 DIAGNOSIS — I129 Hypertensive chronic kidney disease with stage 1 through stage 4 chronic kidney disease, or unspecified chronic kidney disease: Secondary | ICD-10-CM | POA: Diagnosis not present

## 2014-12-10 DIAGNOSIS — S92402D Displaced unspecified fracture of left great toe, subsequent encounter for fracture with routine healing: Secondary | ICD-10-CM | POA: Diagnosis not present

## 2014-12-12 DIAGNOSIS — S91112D Laceration without foreign body of left great toe without damage to nail, subsequent encounter: Secondary | ICD-10-CM | POA: Diagnosis not present

## 2014-12-12 DIAGNOSIS — S92402D Displaced unspecified fracture of left great toe, subsequent encounter for fracture with routine healing: Secondary | ICD-10-CM | POA: Diagnosis not present

## 2014-12-12 DIAGNOSIS — E1142 Type 2 diabetes mellitus with diabetic polyneuropathy: Secondary | ICD-10-CM | POA: Diagnosis not present

## 2014-12-12 DIAGNOSIS — E1165 Type 2 diabetes mellitus with hyperglycemia: Secondary | ICD-10-CM | POA: Diagnosis not present

## 2014-12-12 DIAGNOSIS — I129 Hypertensive chronic kidney disease with stage 1 through stage 4 chronic kidney disease, or unspecified chronic kidney disease: Secondary | ICD-10-CM | POA: Diagnosis not present

## 2014-12-12 DIAGNOSIS — I69393 Ataxia following cerebral infarction: Secondary | ICD-10-CM | POA: Diagnosis not present

## 2014-12-15 DIAGNOSIS — S91112D Laceration without foreign body of left great toe without damage to nail, subsequent encounter: Secondary | ICD-10-CM | POA: Diagnosis not present

## 2014-12-15 DIAGNOSIS — I129 Hypertensive chronic kidney disease with stage 1 through stage 4 chronic kidney disease, or unspecified chronic kidney disease: Secondary | ICD-10-CM | POA: Diagnosis not present

## 2014-12-15 DIAGNOSIS — E1142 Type 2 diabetes mellitus with diabetic polyneuropathy: Secondary | ICD-10-CM | POA: Diagnosis not present

## 2014-12-15 DIAGNOSIS — I69393 Ataxia following cerebral infarction: Secondary | ICD-10-CM | POA: Diagnosis not present

## 2014-12-15 DIAGNOSIS — S92402D Displaced unspecified fracture of left great toe, subsequent encounter for fracture with routine healing: Secondary | ICD-10-CM | POA: Diagnosis not present

## 2014-12-15 DIAGNOSIS — E1165 Type 2 diabetes mellitus with hyperglycemia: Secondary | ICD-10-CM | POA: Diagnosis not present

## 2014-12-16 ENCOUNTER — Telehealth: Payer: Self-pay | Admitting: Neurology

## 2014-12-16 DIAGNOSIS — I63132 Cerebral infarction due to embolism of left carotid artery: Secondary | ICD-10-CM | POA: Diagnosis not present

## 2014-12-16 DIAGNOSIS — E782 Mixed hyperlipidemia: Secondary | ICD-10-CM | POA: Diagnosis not present

## 2014-12-16 DIAGNOSIS — I10 Essential (primary) hypertension: Secondary | ICD-10-CM | POA: Diagnosis not present

## 2014-12-16 DIAGNOSIS — E119 Type 2 diabetes mellitus without complications: Secondary | ICD-10-CM | POA: Diagnosis not present

## 2014-12-16 NOTE — Telephone Encounter (Signed)
Spoke with wife and scheduled patient for hospital stroke FU with Dr Leonie Man on 03/02/15. Confirmed this office's location with her.

## 2014-12-16 NOTE — Telephone Encounter (Signed)
Pt's wife called to schedule 2 mo stroke f/u with Dr. Leonie Man, the first available appt I could find was in August. The Pt was admitted to the hospital on 4/30. Please call and advise.

## 2014-12-17 DIAGNOSIS — S91112D Laceration without foreign body of left great toe without damage to nail, subsequent encounter: Secondary | ICD-10-CM | POA: Diagnosis not present

## 2014-12-17 DIAGNOSIS — E1165 Type 2 diabetes mellitus with hyperglycemia: Secondary | ICD-10-CM | POA: Diagnosis not present

## 2014-12-17 DIAGNOSIS — I129 Hypertensive chronic kidney disease with stage 1 through stage 4 chronic kidney disease, or unspecified chronic kidney disease: Secondary | ICD-10-CM | POA: Diagnosis not present

## 2014-12-17 DIAGNOSIS — E1142 Type 2 diabetes mellitus with diabetic polyneuropathy: Secondary | ICD-10-CM | POA: Diagnosis not present

## 2014-12-17 DIAGNOSIS — S92402D Displaced unspecified fracture of left great toe, subsequent encounter for fracture with routine healing: Secondary | ICD-10-CM | POA: Diagnosis not present

## 2014-12-17 DIAGNOSIS — I69393 Ataxia following cerebral infarction: Secondary | ICD-10-CM | POA: Diagnosis not present

## 2014-12-21 DIAGNOSIS — S91112D Laceration without foreign body of left great toe without damage to nail, subsequent encounter: Secondary | ICD-10-CM | POA: Diagnosis not present

## 2014-12-21 DIAGNOSIS — S92402D Displaced unspecified fracture of left great toe, subsequent encounter for fracture with routine healing: Secondary | ICD-10-CM | POA: Diagnosis not present

## 2014-12-21 DIAGNOSIS — I129 Hypertensive chronic kidney disease with stage 1 through stage 4 chronic kidney disease, or unspecified chronic kidney disease: Secondary | ICD-10-CM | POA: Diagnosis not present

## 2014-12-21 DIAGNOSIS — I69393 Ataxia following cerebral infarction: Secondary | ICD-10-CM | POA: Diagnosis not present

## 2014-12-21 DIAGNOSIS — E1142 Type 2 diabetes mellitus with diabetic polyneuropathy: Secondary | ICD-10-CM | POA: Diagnosis not present

## 2014-12-21 DIAGNOSIS — E1165 Type 2 diabetes mellitus with hyperglycemia: Secondary | ICD-10-CM | POA: Diagnosis not present

## 2014-12-22 NOTE — Progress Notes (Signed)
HPI: Follow-up aortic stenosis. Echocardiogram November 2015 showed normal LV function, mild aortic stenosis with a mean gradient of 10 mmHg, mild mitral regurgitation and mild biatrial enlargement. Nuclear study 2015 showed an ejection fraction of 44%. Perfusion was normal. Admitted wit CVA 5/16. Echo repeated and showed normal LV function; grade 1 diastolic dysfunction; no significant AS. carotid dopplers 5/16 showed 1-39 bilateral stenosis. Since he was last seen patient denies dyspnea, chest pain, palpitations or syncope.  Current Outpatient Prescriptions  Medication Sig Dispense Refill  . aspirin 325 MG tablet Take 1 tablet (325 mg total) by mouth daily. 30 tablet 0  . B Complex-C (B-COMPLEX WITH VITAMIN C) tablet Take 1 tablet by mouth daily.    . calcium carbonate (OS-CAL) 600 MG TABS Take 600 mg by mouth daily with breakfast.     . Carboxymethylcellulose Sodium (REFRESH LIQUIGEL OP) Apply 1 drop to eye 4 (four) times daily.    . cyclobenzaprine (FLEXERIL) 10 MG tablet Take 10 mg by mouth at bedtime.    Marland Kitchen doxycycline (VIBRAMYCIN) 100 MG capsule Take 100 mg by mouth daily.  0  . DULoxetine (CYMBALTA) 30 MG capsule Take 30 mg by mouth daily. Take with a 60 to make 90    . DULoxetine (CYMBALTA) 60 MG capsule Take 60 mg by mouth daily. Take with 30 to make 90    . gabapentin (NEURONTIN) 100 MG capsule Take 100 mg by mouth 2 (two) times daily.    Marland Kitchen glyBURIDE-metformin (GLUCOVANCE) 2.5-500 MG per tablet Take 2 tablets by mouth daily with breakfast. 2 tabs at breakfast and 1 at Supper    . hydrochlorothiazide (HYDRODIURIL) 25 MG tablet Take 25 mg by mouth daily.    . irbesartan (AVAPRO) 150 MG tablet Take 150 mg by mouth daily.    Marland Kitchen LORazepam (ATIVAN) 2 MG tablet Take 2 mg by mouth at bedtime.    . Magnesium Citrate 100 MG TABS Take 1 tablet by mouth daily.    . meloxicam (MOBIC) 15 MG tablet Take 15 mg by mouth at bedtime.    . metoprolol tartrate (LOPRESSOR) 25 MG tablet Take 0.5  tablets (12.5 mg total) by mouth 2 (two) times daily. 60 tablet 1  . pramipexole (MIRAPEX) 0.5 MG tablet Take 0.5 mg by mouth at bedtime.     . pravastatin (PRAVACHOL) 40 MG tablet Take 40 mg by mouth daily.    Marland Kitchen Propylene Glycol (SYSTANE BALANCE) 0.6 % SOLN Apply 1 drop to eye 4 (four) times daily.    . tamsulosin (FLOMAX) 0.4 MG CAPS Take by mouth.    . vitamin C (ASCORBIC ACID) 500 MG tablet Take 500 mg by mouth daily.     No current facility-administered medications for this visit.     Past Medical History  Diagnosis Date  . Diabetes mellitus without complication   . Neuropathy, diabetic   . Hypertension   . Renal insufficiency     Past Surgical History  Procedure Laterality Date  . Back surgery    . Broken leg    . Cervical disc surgery    . Tonsillectomy    . Appendectomy      History   Social History  . Marital Status: Married    Spouse Name: N/A  . Number of Children: N/A  . Years of Education: N/A   Occupational History  . Not on file.   Social History Main Topics  . Smoking status: Former Smoker    Quit date: 12/22/1979  .  Smokeless tobacco: Not on file  . Alcohol Use: 0.6 oz/week    1 Glasses of wine per week     Comment: wine  . Drug Use: No  . Sexual Activity: Not on file   Other Topics Concern  . Not on file   Social History Narrative    ROS: no fevers or chills, productive cough, hemoptysis, dysphasia, odynophagia, melena, hematochezia, dysuria, hematuria, rash, seizure activity, orthopnea, PND, pedal edema, claudication. Remaining systems are negative.  Physical Exam: Well-developed well-nourished in no acute distress.  Skin is warm and dry.  HEENT is normal.  Neck is supple.  Chest is clear to auscultation with normal expansion.  Cardiovascular exam is regular rate and rhythm. 2/6 systolic murmur left sternal border. S2 is not diminished. Abdominal exam nontender or distended. No masses palpated. Extremities show no edema. neuro  grossly intact  ECG 11/28/2014-sinus rhythm, inferior infarct.

## 2014-12-23 DIAGNOSIS — I69393 Ataxia following cerebral infarction: Secondary | ICD-10-CM | POA: Diagnosis not present

## 2014-12-23 DIAGNOSIS — I129 Hypertensive chronic kidney disease with stage 1 through stage 4 chronic kidney disease, or unspecified chronic kidney disease: Secondary | ICD-10-CM | POA: Diagnosis not present

## 2014-12-23 DIAGNOSIS — E1142 Type 2 diabetes mellitus with diabetic polyneuropathy: Secondary | ICD-10-CM | POA: Diagnosis not present

## 2014-12-23 DIAGNOSIS — E1165 Type 2 diabetes mellitus with hyperglycemia: Secondary | ICD-10-CM | POA: Diagnosis not present

## 2014-12-23 DIAGNOSIS — S92402D Displaced unspecified fracture of left great toe, subsequent encounter for fracture with routine healing: Secondary | ICD-10-CM | POA: Diagnosis not present

## 2014-12-23 DIAGNOSIS — S91112D Laceration without foreign body of left great toe without damage to nail, subsequent encounter: Secondary | ICD-10-CM | POA: Diagnosis not present

## 2014-12-29 ENCOUNTER — Ambulatory Visit (INDEPENDENT_AMBULATORY_CARE_PROVIDER_SITE_OTHER): Payer: Medicare Other | Admitting: Cardiology

## 2014-12-29 ENCOUNTER — Encounter: Payer: Self-pay | Admitting: Cardiology

## 2014-12-29 VITALS — BP 124/52 | HR 70 | Ht 69.0 in | Wt 207.0 lb

## 2014-12-29 DIAGNOSIS — E785 Hyperlipidemia, unspecified: Secondary | ICD-10-CM

## 2014-12-29 DIAGNOSIS — I350 Nonrheumatic aortic (valve) stenosis: Secondary | ICD-10-CM

## 2014-12-29 DIAGNOSIS — I639 Cerebral infarction, unspecified: Secondary | ICD-10-CM | POA: Diagnosis not present

## 2014-12-29 DIAGNOSIS — I10 Essential (primary) hypertension: Secondary | ICD-10-CM | POA: Diagnosis not present

## 2014-12-29 DIAGNOSIS — S92425S Nondisplaced fracture of distal phalanx of left great toe, sequela: Secondary | ICD-10-CM | POA: Diagnosis not present

## 2014-12-29 NOTE — Assessment & Plan Note (Signed)
Continue statin. 

## 2014-12-29 NOTE — Assessment & Plan Note (Signed)
We will plan follow-up echoes in the future.

## 2014-12-29 NOTE — Patient Instructions (Signed)
Your physician wants you to follow-up in: ONE YEAR WITH DR CRENSHAW You will receive a reminder letter in the mail two months in advance. If you don't receive a letter, please call our office to schedule the follow-up appointment.  

## 2014-12-29 NOTE — Assessment & Plan Note (Signed)
Blood pressure controlled. Continue present medications. 

## 2015-01-07 DIAGNOSIS — I69393 Ataxia following cerebral infarction: Secondary | ICD-10-CM | POA: Diagnosis not present

## 2015-01-07 DIAGNOSIS — E1142 Type 2 diabetes mellitus with diabetic polyneuropathy: Secondary | ICD-10-CM | POA: Diagnosis not present

## 2015-01-13 ENCOUNTER — Encounter (INDEPENDENT_AMBULATORY_CARE_PROVIDER_SITE_OTHER): Payer: Medicare Other | Admitting: Ophthalmology

## 2015-01-13 DIAGNOSIS — I10 Essential (primary) hypertension: Secondary | ICD-10-CM | POA: Diagnosis not present

## 2015-01-13 DIAGNOSIS — H43813 Vitreous degeneration, bilateral: Secondary | ICD-10-CM | POA: Diagnosis not present

## 2015-01-13 DIAGNOSIS — H3532 Exudative age-related macular degeneration: Secondary | ICD-10-CM

## 2015-01-13 DIAGNOSIS — H3531 Nonexudative age-related macular degeneration: Secondary | ICD-10-CM

## 2015-01-13 DIAGNOSIS — H35033 Hypertensive retinopathy, bilateral: Secondary | ICD-10-CM | POA: Diagnosis not present

## 2015-01-13 DIAGNOSIS — E11319 Type 2 diabetes mellitus with unspecified diabetic retinopathy without macular edema: Secondary | ICD-10-CM | POA: Diagnosis not present

## 2015-01-13 DIAGNOSIS — E11329 Type 2 diabetes mellitus with mild nonproliferative diabetic retinopathy without macular edema: Secondary | ICD-10-CM

## 2015-01-31 ENCOUNTER — Emergency Department (HOSPITAL_BASED_OUTPATIENT_CLINIC_OR_DEPARTMENT_OTHER)
Admission: EM | Admit: 2015-01-31 | Discharge: 2015-01-31 | Disposition: A | Discharge status: Home / Self Care | Payer: Medicare Other | Attending: Emergency Medicine | Admitting: Emergency Medicine

## 2015-01-31 ENCOUNTER — Encounter (HOSPITAL_BASED_OUTPATIENT_CLINIC_OR_DEPARTMENT_OTHER): Payer: Self-pay | Admitting: *Deleted

## 2015-01-31 DIAGNOSIS — S80212A Abrasion, left knee, initial encounter: Secondary | ICD-10-CM | POA: Diagnosis not present

## 2015-01-31 DIAGNOSIS — Z7982 Long term (current) use of aspirin: Secondary | ICD-10-CM | POA: Diagnosis not present

## 2015-01-31 DIAGNOSIS — T07XXXA Unspecified multiple injuries, initial encounter: Secondary | ICD-10-CM

## 2015-01-31 DIAGNOSIS — Z79899 Other long term (current) drug therapy: Secondary | ICD-10-CM | POA: Diagnosis not present

## 2015-01-31 DIAGNOSIS — S50311A Abrasion of right elbow, initial encounter: Secondary | ICD-10-CM | POA: Diagnosis not present

## 2015-01-31 DIAGNOSIS — I10 Essential (primary) hypertension: Secondary | ICD-10-CM | POA: Insufficient documentation

## 2015-01-31 DIAGNOSIS — Y9389 Activity, other specified: Secondary | ICD-10-CM | POA: Insufficient documentation

## 2015-01-31 DIAGNOSIS — Z792 Long term (current) use of antibiotics: Secondary | ICD-10-CM | POA: Insufficient documentation

## 2015-01-31 DIAGNOSIS — Z8673 Personal history of transient ischemic attack (TIA), and cerebral infarction without residual deficits: Secondary | ICD-10-CM | POA: Insufficient documentation

## 2015-01-31 DIAGNOSIS — Z87891 Personal history of nicotine dependence: Secondary | ICD-10-CM | POA: Diagnosis not present

## 2015-01-31 DIAGNOSIS — W108XXA Fall (on) (from) other stairs and steps, initial encounter: Secondary | ICD-10-CM | POA: Diagnosis not present

## 2015-01-31 DIAGNOSIS — S80211A Abrasion, right knee, initial encounter: Secondary | ICD-10-CM | POA: Insufficient documentation

## 2015-01-31 DIAGNOSIS — L089 Local infection of the skin and subcutaneous tissue, unspecified: Secondary | ICD-10-CM | POA: Insufficient documentation

## 2015-01-31 DIAGNOSIS — Y92009 Unspecified place in unspecified non-institutional (private) residence as the place of occurrence of the external cause: Secondary | ICD-10-CM | POA: Diagnosis not present

## 2015-01-31 DIAGNOSIS — E1140 Type 2 diabetes mellitus with diabetic neuropathy, unspecified: Secondary | ICD-10-CM | POA: Insufficient documentation

## 2015-01-31 DIAGNOSIS — S59901A Unspecified injury of right elbow, initial encounter: Secondary | ICD-10-CM | POA: Diagnosis present

## 2015-01-31 DIAGNOSIS — Y998 Other external cause status: Secondary | ICD-10-CM | POA: Insufficient documentation

## 2015-01-31 DIAGNOSIS — Z87448 Personal history of other diseases of urinary system: Secondary | ICD-10-CM | POA: Diagnosis not present

## 2015-01-31 HISTORY — DX: Cerebral infarction, unspecified: I63.9

## 2015-01-31 MED ORDER — CEPHALEXIN 500 MG PO CAPS: 1000.0000 mg | ORAL_CAPSULE | Freq: Two times a day (BID) | ORAL | Status: DC

## 2015-01-31 MED ORDER — MUPIROCIN 2 % EX OINT: 1.0000 "application " | TOPICAL_OINTMENT | Freq: Two times a day (BID) | CUTANEOUS | Status: AC

## 2015-01-31 NOTE — Discharge Instructions (Signed)

## 2015-01-31 NOTE — ED Provider Notes (Signed)
CSN: [id number]     Arrival date & time 01/31/15  1613 History  This chart was scribed for  No att. providers found by Altamease Oiler, ED Scribe. This patient was seen in room MH04/MH04 and the patient's care was started at 4:40 PM.   Chief Complaint  Patient presents with  . Fall    The history is provided by the patient and a caregiver. No language interpreter was used.   Kristopher Zimmerman is a 81 y.o. male with PMHx of DM who presents to the Emergency Department complaining of fall at home 6 days PTA. The pt was using a set of carpeted outdoor stairs and tripped. After falling he crawled to get up. Associated symptoms include abrasions to the bilateral knees and right elbow. The patient's caretaker has been using abx ointment left over from when the pt injured his toe on the abrasions but recently ran out. She is concerned for wound infection and requesting oral abx. As he is a diabetic and the wounds seem to be developing some redness around them. He is ambulating normally and using the right arm as normal. The patient denies she is having any pain. His gait is not impaired from the injuries. He has not developed fever, chills or any general illness and association with this.  Past Medical History  Diagnosis Date  . Diabetes mellitus without complication   . Neuropathy, diabetic   . Hypertension   . Renal insufficiency   . Stroke    Past Surgical History  Procedure Laterality Date  . Back surgery    . Broken leg    . Cervical disc surgery    . Tonsillectomy    . Appendectomy     Family History  Problem Relation Age of Onset  . Cirrhosis Father   . Diabetes Brother   . Cancer - Lung Sister   . Alzheimer's disease Sister   . Heart disease Sister   . Cancer - Lung Brother    History  Substance Use Topics  . Smoking status: Former Smoker    Quit date: 12/22/1979  . Smokeless tobacco: Not on file  . Alcohol Use: 0.6 oz/week    1 Glasses of wine per week     Comment: wine     Review of Systems  10 Systems reviewed and all are negative for acute change except as noted in the HPI.    Allergies  Review of patient's allergies indicates no known allergies.  Home Medications   Prior to Admission medications   Medication Sig Start Date End Date Taking? Authorizing Provider  aspirin 325 MG tablet Take 1 tablet (325 mg total) by mouth daily. 11/30/14   Orson Eva, MD  B Complex-C (B-COMPLEX WITH VITAMIN C) tablet Take 1 tablet by mouth daily.    Historical Provider, MD  calcium carbonate (OS-CAL) 600 MG TABS Take 600 mg by mouth daily with breakfast.     Historical Provider, MD  Carboxymethylcellulose Sodium (REFRESH LIQUIGEL OP) Apply 1 drop to eye 4 (four) times daily.    Historical Provider, MD  cephALEXin (KEFLEX) 500 MG capsule Take 2 capsules (1,000 mg total) by mouth 2 (two) times daily. 01/31/15   Charlesetta Shanks, MD  cyclobenzaprine (FLEXERIL) 10 MG tablet Take 10 mg by mouth at bedtime.    Historical Provider, MD  doxycycline (VIBRAMYCIN) 100 MG capsule Take 100 mg by mouth daily. 12/24/14   Historical Provider, MD  DULoxetine (CYMBALTA) 30 MG capsule Take 30 mg by mouth daily. Take  with a 60 to make 90    Historical Provider, MD  DULoxetine (CYMBALTA) 60 MG capsule Take 60 mg by mouth daily. Take with 30 to make 90    Historical Provider, MD  gabapentin (NEURONTIN) 100 MG capsule Take 100 mg by mouth 2 (two) times daily.    Historical Provider, MD  glyBURIDE-metformin (GLUCOVANCE) 2.5-500 MG per tablet Take 2 tablets by mouth daily with breakfast. 2 tabs at breakfast and 1 at [place] Provider, MD  hydrochlorothiazide (HYDRODIURIL) 25 MG tablet Take 25 mg by mouth daily.    Historical Provider, MD  irbesartan (AVAPRO) 150 MG tablet Take 150 mg by mouth daily.    Historical Provider, MD  LORazepam (ATIVAN) 2 MG tablet Take 2 mg by mouth at bedtime.    Historical Provider, MD  Magnesium Citrate 100 MG TABS Take 1 tablet by mouth daily.    Historical  Provider, MD  meloxicam (MOBIC) 15 MG tablet Take 15 mg by mouth at bedtime.    Historical Provider, MD  metoprolol tartrate (LOPRESSOR) 25 MG tablet Take 0.5 tablets (12.5 mg total) by mouth 2 (two) times daily. 11/30/14   Orson Eva, MD  mupirocin ointment (BACTROBAN) 2 % Apply 1 application topically 2 (two) times daily. 01/31/15 02/07/15  Charlesetta Shanks, MD  pramipexole (MIRAPEX) 0.5 MG tablet Take 0.5 mg by mouth at bedtime.     Historical Provider, MD  pravastatin (PRAVACHOL) 40 MG tablet Take 40 mg by mouth daily.    Historical Provider, MD  Propylene Glycol (SYSTANE BALANCE) 0.6 % SOLN Apply 1 drop to eye 4 (four) times daily.    Historical Provider, MD  tamsulosin (FLOMAX) 0.4 MG CAPS Take by mouth.    Historical Provider, MD  vitamin C (ASCORBIC ACID) 500 MG tablet Take 500 mg by mouth daily.    Historical Provider, MD   Triage Vitals: BP 145/69 mmHg  Pulse 66  Temp(Src) 98.2 F (36.8 C) (Oral)  Resp 18  Ht 5\' 9"  (1.753 m)  Wt 205 lb (92.987 kg)  BMI 30.26 kg/m2  SpO2 100% Physical Exam  Constitutional: He is oriented to person, place, and time. He appears well-developed and well-nourished. No distress.  Patient has well appearance, sitting at the edge of the stretcher alert and appropriate.  HENT:  Head: Normocephalic and atraumatic.  Eyes: EOM are normal.  Pulmonary/Chest: Effort normal.  Musculoskeletal: Normal range of motion.  No joint effusions. Normal range of motion of both upper extremities with flexion and extension without difficulty. Normal range of motion of both lower extremities without difficulty. No peripheral edema. Abrasions as documented in the photograph.  Neurological: He is alert and oriented to person, place, and time. No cranial nerve deficit. He exhibits normal muscle tone. Coordination normal.  Skin: Skin is warm and dry.  Psychiatric: He has a normal mood and affect.              ED Course  Procedures  DIAGNOSTIC STUDIES: Oxygen Saturation  is 100% on RA, normal by my interpretation.    COORDINATION OF CARE: 4:40 PM Discussed treatment plan which includes outpatient management with oral abx, abx ointment, and f/u with PCP with pt at bedside and pt agreed to plan.  Labs Review Labs Reviewed - No data to display  Imaging Review No results found.   EKG Interpretation None      MDM   Final diagnoses:  Abrasions of multiple sites with infection   Patient has abrasions from fall  much earlier in the week. He is clinically well in appearance. He is not having any difficulty using the extremities due to injuries. There has been some redness developing around the abrasions on the knee. At this time he will be given a protrusion for Keflex and mupirocin to continue. The wounds appear to be healing well with mild surrounding erythema on the knee but no associated effusion or joint pain. The patient will given instructions to follow-up with his family physician or return to the emergency department if worsening or changing symptoms.     Charlesetta Shanks, MD 01/31/15 [phone]

## 2015-01-31 NOTE — ED Notes (Signed)
Pt reports falling on Monday.  Denies any change since that time.  Pt denies hitting head.  Noted to have abrasions on his knees from crawling to get up.

## 2015-03-02 ENCOUNTER — Encounter: Payer: Self-pay | Admitting: Neurology

## 2015-03-02 ENCOUNTER — Ambulatory Visit (INDEPENDENT_AMBULATORY_CARE_PROVIDER_SITE_OTHER): Payer: Medicare Other | Admitting: Neurology

## 2015-03-02 VITALS — BP 116/65 | HR 74 | Ht 69.5 in | Wt 206.0 lb

## 2015-03-02 DIAGNOSIS — I6381 Other cerebral infarction due to occlusion or stenosis of small artery: Secondary | ICD-10-CM

## 2015-03-02 DIAGNOSIS — E1340 Other specified diabetes mellitus with diabetic neuropathy, unspecified: Secondary | ICD-10-CM

## 2015-03-02 DIAGNOSIS — I639 Cerebral infarction, unspecified: Secondary | ICD-10-CM

## 2015-03-02 DIAGNOSIS — E1140 Type 2 diabetes mellitus with diabetic neuropathy, unspecified: Secondary | ICD-10-CM | POA: Insufficient documentation

## 2015-03-02 NOTE — Patient Instructions (Signed)
I had a long d/w patient and wife about his recent stroke, risk for recurrent stroke/TIAs, personally independently reviewed imaging studies and stroke evaluation results and answered questions.Continue aspirin 325 mg orally every day  for secondary stroke prevention and maintain strict control of hypertension with blood pressure goal below 130/90, diabetes with hemoglobin A1c goal below 6.5% and lipids with LDL cholesterol goal below 100 mg/dL. I also advised the patient to eat a healthy diet with plenty of whole grains, cereals, fruits and vegetables, exercise regularly and maintain ideal body weight .Greater than 50% of time of this 30 minute visit was spent on counseling and coordination of care. Followup in the future with me in  6 months or call earlier if necessary  Stroke Prevention Some medical conditions and behaviors are associated with an increased chance of having a stroke. You may prevent a stroke by making healthy choices and managing medical conditions. HOW CAN I REDUCE MY RISK OF HAVING A STROKE?   Stay physically active. Get at least 30 minutes of activity on most or all days.  Do not smoke. It may also be helpful to avoid exposure to secondhand smoke.  Limit alcohol use. Moderate alcohol use is considered to be:  No more than 2 drinks per day for men.  No more than 1 drink per day for nonpregnant women.  Eat healthy foods. This involves:  Eating 5 or more servings of fruits and vegetables a day.  Making dietary changes that address high blood pressure (hypertension), high cholesterol, diabetes, or obesity.  Manage your cholesterol levels.  Making food choices that are high in fiber and low in saturated fat, trans fat, and cholesterol may control cholesterol levels.  Take any prescribed medicines to control cholesterol as directed by your health care provider.  Manage your diabetes.  Controlling your carbohydrate and sugar intake is recommended to manage  diabetes.  Take any prescribed medicines to control diabetes as directed by your health care provider.  Control your hypertension.  Making food choices that are low in salt (sodium), saturated fat, trans fat, and cholesterol is recommended to manage hypertension.  Take any prescribed medicines to control hypertension as directed by your health care provider.  Maintain a healthy weight.  Reducing calorie intake and making food choices that are low in sodium, saturated fat, trans fat, and cholesterol are recommended to manage weight.  Stop drug abuse.  Avoid taking birth control pills.  Talk to your health care provider about the risks of taking birth control pills if you are over 72 years old, smoke, get migraines, or have ever had a blood clot.  Get evaluated for sleep disorders (sleep apnea).  Talk to your health care provider about getting a sleep evaluation if you snore a lot or have excessive sleepiness.  Take medicines only as directed by your health care provider.  For some people, aspirin or blood thinners (anticoagulants) are helpful in reducing the risk of forming abnormal blood clots that can lead to stroke. If you have the irregular heart rhythm of atrial fibrillation, you should be on a blood thinner unless there is a good reason you cannot take them.  Understand all your medicine instructions.  Make sure that other conditions (such as anemia or atherosclerosis) are addressed. SEEK IMMEDIATE MEDICAL CARE IF:   You have sudden weakness or numbness of the face, arm, or leg, especially on one side of the body.  Your face or eyelid droops to one side.  You have sudden confusion.  You have trouble speaking (aphasia) or understanding.  You have sudden trouble seeing in one or both eyes.  You have sudden trouble walking.  You have dizziness.  You have a loss of balance or coordination.  You have a sudden, severe headache with no known cause.  You have new chest  pain or an irregular heartbeat. Any of these symptoms may represent a serious problem that is an emergency. Do not wait to see if the symptoms will go away. Get medical help at once. Call your local emergency services (911 in [place] Do not drive yourself to the hospital. Document Released: 08/24/2004 Document Revised: 12/01/2013 Document Reviewed: 01/17/2013 [facility] Patient Information 2015 [place], [place]. This information is not intended to replace advice given to you by your health care provider. Make sure you discuss any questions you have with your health care provider.

## 2015-03-03 NOTE — Progress Notes (Signed)
[facility] [address] [place]. [place] [address] [phone]       OFFICE FOLLOW-UP NOTE  [name] Date of Birth:  11-30-1923 Medical Record Number:  [id number]   HPI:81 y.o. male with a history of dm, htn who presented with slurred speech and increased falls since 11/27/14 He injured his toe fairly badly the day prior and was in the ER. He had taken his evening medications prior to coming in, so his wife did not think anything of the slurred speech and it was not mentioned to ER staff. CT scan of the head showed mild generalized atrophy and small vessel ischemic changes. MRI scan of the brain showed an acute sub-centimeter left basal ganglia/corona radiata infarct. MRA of the brain did not reveal large vessel stenosis. Carotid ultrasound showed no significant extracranial stenosis. Transthoracic echo showed normal ejection fraction of embolism. LDL cholesterol was normal at 43 mg percent. Hemoglobin A1c was slightly elevated at 7.0. Vascular risk factors identified included hypertension, diabetes and hyperlipidemia. Patient was seen by physical occupational speech therapy. He was started on aspirin 325 mg daily and advise aggressive risk factor management. Patient hast done well since discharge. His speech and has improved but he still has mild right facial droop. His right leg weakness is improved. His blood pressure is better controlled after additional metoprolol and is 116/65 today. He has no new complaints. He has mild lower extremity paresthesia Allen. 3. Diabetic neuropathy. He states his sugars are well controlled and hemoglobin A1c has not been repeated.  ROS:   14 system review of systems is positive for  no complaints  and all other systems negative  PMH:  Past Medical History  Diagnosis Date  . Diabetes mellitus without complication   . Neuropathy, diabetic   . Hypertension   . Renal insufficiency   . Stroke     Social History:  History    Social History  . Marital Status: Married    Spouse Name: N/A  . Number of Children: N/A  . Years of Education: N/A   Occupational History  . Not on file.   Social History Main Topics  . Smoking status: Former Smoker    Quit date: 12/22/1979  . Smokeless tobacco: Not on file  . Alcohol Use: 0.6 oz/week    1 Glasses of wine per week     Comment: wine  . Drug Use: No  . Sexual Activity: Not on file   Other Topics Concern  . Not on file   Social History Narrative    Medications:   Current Outpatient Prescriptions on File Prior to Visit  Medication Sig Dispense Refill  . aspirin 325 MG tablet Take 1 tablet (325 mg total) by mouth daily. 30 tablet 0  . B Complex-C (B-COMPLEX WITH VITAMIN C) tablet Take 1 tablet by mouth daily.    . calcium carbonate (OS-CAL) 600 MG TABS Take 600 mg by mouth daily with breakfast.     . Carboxymethylcellulose Sodium (REFRESH LIQUIGEL OP) Apply 1 drop to eye 4 (four) times daily.    . cyclobenzaprine (FLEXERIL) 10 MG tablet Take 10 mg by mouth at bedtime.    Marland Kitchen doxycycline (VIBRAMYCIN) 100 MG capsule Take 100 mg by mouth daily.  0  . DULoxetine (CYMBALTA) 30 MG capsule Take 30 mg by mouth daily. Take with a 60 to make 90    . DULoxetine (CYMBALTA) 60 MG capsule Take 60 mg by mouth daily. Take with 30 to make 90    .  gabapentin (NEURONTIN) 100 MG capsule Take 100 mg by mouth 2 (two) times daily.    Marland Kitchen glyBURIDE-metformin (GLUCOVANCE) 2.5-500 MG per tablet Take 2 tablets by mouth daily with breakfast. 2 tabs at breakfast and 1 at Supper    . hydrochlorothiazide (HYDRODIURIL) 25 MG tablet Take 25 mg by mouth daily.    . irbesartan (AVAPRO) 150 MG tablet Take 150 mg by mouth daily.    Marland Kitchen LORazepam (ATIVAN) 2 MG tablet Take 2 mg by mouth at bedtime.    . Magnesium Citrate 100 MG TABS Take 1 tablet by mouth daily.    . meloxicam (MOBIC) 15 MG tablet Take 15 mg by mouth at bedtime.    . metoprolol tartrate (LOPRESSOR) 25 MG tablet Take 0.5 tablets (12.5  mg total) by mouth 2 (two) times daily. 60 tablet 1  . pramipexole (MIRAPEX) 0.5 MG tablet Take 0.5 mg by mouth at bedtime.     . pravastatin (PRAVACHOL) 40 MG tablet Take 40 mg by mouth daily.    Marland Kitchen Propylene Glycol (SYSTANE BALANCE) 0.6 % SOLN Apply 1 drop to eye 4 (four) times daily.    . tamsulosin (FLOMAX) 0.4 MG CAPS Take by mouth.    . vitamin C (ASCORBIC ACID) 500 MG tablet Take 500 mg by mouth daily.     No current facility-administered medications on file prior to visit.    Allergies:  No Known Allergies  Physical Exam General: well developed, well nourished elderly male, seated, in no evident distress Head: head normocephalic and atraumatic.  Neck: supple with no carotid or supraclavicular bruits Cardiovascular: regular rate and rhythm, no murmurs Musculoskeletal: no deformity Skin:  no rash/petichiae Vascular:  Normal pulses all extremities Filed Vitals:   03/02/15 0927  BP: 116/65  Pulse: 74   Neurologic Exam Mental Status: Awake and fully alert. Oriented to place and time. Recent and remote memory intact. Attention span, concentration and fund of knowledge appropriate. Mood and affect appropriate.  Cranial Nerves: Fundoscopic exam reveals sharp disc margins. Pupils equal, briskly reactive to light. Extraocular movements full without nystagmus. Visual fields full to confrontation. Hearing intact. Facial sensation intact. Mild right lower face weakness but, tongue, palate moves normally and symmetrically.  Motor: Normal bulk and tone. Normal strength in all tested extremity muscles. Except mild bilateral ankle dorsiflexor and Mild right grip weakness. Diminished fine finger movements on the right. Orbits left over right upper extremity. Sensory.: intact to touch ,pinprick  But diminished .position and vibratory sensation over toes bilaterally.  Coordination: Rapid alternating movements normal in all extremities. Finger-to-nose and heel-to-shin performed accurately  bilaterally. Gait and Station: Arises from chair without difficulty. Stance is normal. Gait demonstrates normal stride length and balance . Able to heel, toe and tandem walk without difficulty.  Reflexes: 1+ and symmetric except ankle jerks are absent.. Toes downgoing.   NIHSS  1 Modified Rankin  2   ASSESSMENT: 81 year male with left basal ganglia subcortical infarct secondary to small vessel disease in April 2016 with vascular risk factors of diabetes and hyperlipidemia     PLAN: I had a long d/w patient and wife about his recent stroke, risk for recurrent stroke/TIAs, personally independently reviewed imaging studies and stroke evaluation results and answered questions.Continue aspirin 325 mg orally every day  for secondary stroke prevention and maintain strict control of hypertension with blood pressure goal below 130/90, diabetes with hemoglobin A1c goal below 6.5% and lipids with LDL cholesterol goal below 100 mg/dL. I also advised the patient to  eat a healthy diet with plenty of whole grains, cereals, fruits and vegetables, exercise regularly and maintain ideal body weight .Greater than 50% of time of this 30 minute visit was spent on counseling and coordination of care. Followup in the future with me in  6 months or call earlier if necessary Antony Contras, MD S   Note: This document was prepared with digital dictation and possible smart phrase technology. Any transcriptional errors that result from this process are unintentional

## 2015-03-08 DIAGNOSIS — L718 Other rosacea: Secondary | ICD-10-CM | POA: Diagnosis not present

## 2015-03-08 DIAGNOSIS — H35443 Age-related reticular degeneration of retina, bilateral: Secondary | ICD-10-CM | POA: Diagnosis not present

## 2015-03-08 DIAGNOSIS — H35033 Hypertensive retinopathy, bilateral: Secondary | ICD-10-CM | POA: Diagnosis not present

## 2015-03-08 DIAGNOSIS — E119 Type 2 diabetes mellitus without complications: Secondary | ICD-10-CM | POA: Diagnosis not present

## 2015-03-22 DIAGNOSIS — E782 Mixed hyperlipidemia: Secondary | ICD-10-CM | POA: Diagnosis not present

## 2015-03-22 DIAGNOSIS — I10 Essential (primary) hypertension: Secondary | ICD-10-CM | POA: Diagnosis not present

## 2015-03-22 DIAGNOSIS — E1165 Type 2 diabetes mellitus with hyperglycemia: Secondary | ICD-10-CM | POA: Diagnosis not present

## 2015-03-22 DIAGNOSIS — E119 Type 2 diabetes mellitus without complications: Secondary | ICD-10-CM | POA: Diagnosis not present

## 2015-03-31 DIAGNOSIS — E782 Mixed hyperlipidemia: Secondary | ICD-10-CM | POA: Diagnosis not present

## 2015-03-31 DIAGNOSIS — E1121 Type 2 diabetes mellitus with diabetic nephropathy: Secondary | ICD-10-CM | POA: Diagnosis not present

## 2015-03-31 DIAGNOSIS — I10 Essential (primary) hypertension: Secondary | ICD-10-CM | POA: Diagnosis not present

## 2015-03-31 DIAGNOSIS — E119 Type 2 diabetes mellitus without complications: Secondary | ICD-10-CM | POA: Diagnosis not present

## 2015-05-19 ENCOUNTER — Encounter (INDEPENDENT_AMBULATORY_CARE_PROVIDER_SITE_OTHER): Payer: Medicare Other | Admitting: Ophthalmology

## 2015-05-19 DIAGNOSIS — I10 Essential (primary) hypertension: Secondary | ICD-10-CM | POA: Diagnosis not present

## 2015-05-19 DIAGNOSIS — E113293 Type 2 diabetes mellitus with mild nonproliferative diabetic retinopathy without macular edema, bilateral: Secondary | ICD-10-CM

## 2015-05-19 DIAGNOSIS — E11319 Type 2 diabetes mellitus with unspecified diabetic retinopathy without macular edema: Secondary | ICD-10-CM

## 2015-05-19 DIAGNOSIS — H43813 Vitreous degeneration, bilateral: Secondary | ICD-10-CM | POA: Diagnosis not present

## 2015-05-19 DIAGNOSIS — H35033 Hypertensive retinopathy, bilateral: Secondary | ICD-10-CM

## 2015-05-19 DIAGNOSIS — H353231 Exudative age-related macular degeneration, bilateral, with active choroidal neovascularization: Secondary | ICD-10-CM

## 2015-07-05 DIAGNOSIS — E1165 Type 2 diabetes mellitus with hyperglycemia: Secondary | ICD-10-CM | POA: Diagnosis not present

## 2015-07-05 DIAGNOSIS — Z0000 Encounter for general adult medical examination without abnormal findings: Secondary | ICD-10-CM | POA: Diagnosis not present

## 2015-07-05 DIAGNOSIS — E782 Mixed hyperlipidemia: Secondary | ICD-10-CM | POA: Diagnosis not present

## 2015-07-05 DIAGNOSIS — M150 Primary generalized (osteo)arthritis: Secondary | ICD-10-CM | POA: Diagnosis not present

## 2015-07-05 DIAGNOSIS — I10 Essential (primary) hypertension: Secondary | ICD-10-CM | POA: Diagnosis not present

## 2015-07-14 DIAGNOSIS — G603 Idiopathic progressive neuropathy: Secondary | ICD-10-CM | POA: Diagnosis not present

## 2015-07-14 DIAGNOSIS — I10 Essential (primary) hypertension: Secondary | ICD-10-CM | POA: Diagnosis not present

## 2015-07-14 DIAGNOSIS — E782 Mixed hyperlipidemia: Secondary | ICD-10-CM | POA: Diagnosis not present

## 2015-07-14 DIAGNOSIS — E1165 Type 2 diabetes mellitus with hyperglycemia: Secondary | ICD-10-CM | POA: Diagnosis not present

## 2015-07-14 DIAGNOSIS — Z23 Encounter for immunization: Secondary | ICD-10-CM | POA: Diagnosis not present

## 2015-07-25 ENCOUNTER — Encounter (HOSPITAL_COMMUNITY): Payer: Self-pay | Admitting: Emergency Medicine

## 2015-07-25 ENCOUNTER — Inpatient Hospital Stay (HOSPITAL_COMMUNITY): Payer: Medicare Other

## 2015-07-25 ENCOUNTER — Inpatient Hospital Stay (HOSPITAL_COMMUNITY)
Admission: EM | Admit: 2015-07-25 | Discharge: 2015-07-26 | DRG: 066 | Disposition: A | Discharge status: Home Health | Payer: Medicare Other | Attending: Internal Medicine | Admitting: Internal Medicine

## 2015-07-25 ENCOUNTER — Emergency Department (HOSPITAL_COMMUNITY): Payer: Medicare Other

## 2015-07-25 DIAGNOSIS — E1140 Type 2 diabetes mellitus with diabetic neuropathy, unspecified: Secondary | ICD-10-CM | POA: Diagnosis present

## 2015-07-25 DIAGNOSIS — E1159 Type 2 diabetes mellitus with other circulatory complications: Secondary | ICD-10-CM | POA: Diagnosis not present

## 2015-07-25 DIAGNOSIS — Z8249 Family history of ischemic heart disease and other diseases of the circulatory system: Secondary | ICD-10-CM

## 2015-07-25 DIAGNOSIS — N183 Chronic kidney disease, stage 3 (moderate): Secondary | ICD-10-CM | POA: Diagnosis present

## 2015-07-25 DIAGNOSIS — I6789 Other cerebrovascular disease: Secondary | ICD-10-CM | POA: Diagnosis not present

## 2015-07-25 DIAGNOSIS — R29705 NIHSS score 5: Secondary | ICD-10-CM | POA: Diagnosis present

## 2015-07-25 DIAGNOSIS — R471 Dysarthria and anarthria: Secondary | ICD-10-CM | POA: Diagnosis present

## 2015-07-25 DIAGNOSIS — Z833 Family history of diabetes mellitus: Secondary | ICD-10-CM | POA: Diagnosis not present

## 2015-07-25 DIAGNOSIS — E1122 Type 2 diabetes mellitus with diabetic chronic kidney disease: Secondary | ICD-10-CM | POA: Diagnosis present

## 2015-07-25 DIAGNOSIS — N189 Chronic kidney disease, unspecified: Secondary | ICD-10-CM | POA: Diagnosis not present

## 2015-07-25 DIAGNOSIS — I350 Nonrheumatic aortic (valve) stenosis: Secondary | ICD-10-CM | POA: Diagnosis present

## 2015-07-25 DIAGNOSIS — Z87891 Personal history of nicotine dependence: Secondary | ICD-10-CM

## 2015-07-25 DIAGNOSIS — N289 Disorder of kidney and ureter, unspecified: Secondary | ICD-10-CM

## 2015-07-25 DIAGNOSIS — R29810 Facial weakness: Secondary | ICD-10-CM | POA: Diagnosis present

## 2015-07-25 DIAGNOSIS — H3530 Unspecified macular degeneration: Secondary | ICD-10-CM | POA: Diagnosis present

## 2015-07-25 DIAGNOSIS — I129 Hypertensive chronic kidney disease with stage 1 through stage 4 chronic kidney disease, or unspecified chronic kidney disease: Secondary | ICD-10-CM | POA: Diagnosis present

## 2015-07-25 DIAGNOSIS — I10 Essential (primary) hypertension: Secondary | ICD-10-CM | POA: Diagnosis not present

## 2015-07-25 DIAGNOSIS — I639 Cerebral infarction, unspecified: Principal | ICD-10-CM

## 2015-07-25 DIAGNOSIS — Z79899 Other long term (current) drug therapy: Secondary | ICD-10-CM | POA: Diagnosis not present

## 2015-07-25 DIAGNOSIS — R262 Difficulty in walking, not elsewhere classified: Secondary | ICD-10-CM | POA: Diagnosis present

## 2015-07-25 DIAGNOSIS — Z7982 Long term (current) use of aspirin: Secondary | ICD-10-CM

## 2015-07-25 DIAGNOSIS — E119 Type 2 diabetes mellitus without complications: Secondary | ICD-10-CM

## 2015-07-25 DIAGNOSIS — R4781 Slurred speech: Secondary | ICD-10-CM | POA: Diagnosis not present

## 2015-07-25 DIAGNOSIS — E785 Hyperlipidemia, unspecified: Secondary | ICD-10-CM | POA: Diagnosis not present

## 2015-07-25 DIAGNOSIS — R531 Weakness: Secondary | ICD-10-CM | POA: Diagnosis not present

## 2015-07-25 DIAGNOSIS — Z7984 Long term (current) use of oral hypoglycemic drugs: Secondary | ICD-10-CM | POA: Diagnosis not present

## 2015-07-25 DIAGNOSIS — E1142 Type 2 diabetes mellitus with diabetic polyneuropathy: Secondary | ICD-10-CM | POA: Diagnosis not present

## 2015-07-25 DIAGNOSIS — N182 Chronic kidney disease, stage 2 (mild): Secondary | ICD-10-CM | POA: Diagnosis present

## 2015-07-25 HISTORY — DX: Unspecified macular degeneration: H35.30

## 2015-07-25 LAB — I-STAT TROPONIN, ED: Troponin i, poc: 0 ng/mL (ref 0.00–0.08)

## 2015-07-25 LAB — I-STAT CHEM 8, ED
BUN: 40 mg/dL — AB (ref 6–20)
Calcium, Ion: 1.14 mmol/L (ref 1.13–1.30)
Chloride: 96 mmol/L — ABNORMAL LOW (ref 101–111)
Creatinine, Ser: 1.7 mg/dL — ABNORMAL HIGH (ref 0.61–1.24)
Glucose, Bld: 173 mg/dL — ABNORMAL HIGH (ref 65–99)
HEMATOCRIT: 34 % — AB (ref 39.0–52.0)
Hemoglobin: 11.6 g/dL — ABNORMAL LOW (ref 13.0–17.0)
POTASSIUM: 4.7 mmol/L (ref 3.5–5.1)
Sodium: 133 mmol/L — ABNORMAL LOW (ref 135–145)
TCO2: 23 mmol/L (ref 0–100)

## 2015-07-25 LAB — PROTIME-INR
INR: 1.02 (ref 0.00–1.49)
PROTHROMBIN TIME: 13.6 s (ref 11.6–15.2)

## 2015-07-25 LAB — CBC
HCT: 32 % — ABNORMAL LOW (ref 39.0–52.0)
Hemoglobin: 10.5 g/dL — ABNORMAL LOW (ref 13.0–17.0)
MCH: 30.8 pg (ref 26.0–34.0)
MCHC: 32.8 g/dL (ref 30.0–36.0)
MCV: 93.8 fL (ref 78.0–100.0)
PLATELETS: 195 10*3/uL (ref 150–400)
RBC: 3.41 MIL/uL — ABNORMAL LOW (ref 4.22–5.81)
RDW: 13 % (ref 11.5–15.5)
WBC: 6.7 10*3/uL (ref 4.0–10.5)

## 2015-07-25 LAB — COMPREHENSIVE METABOLIC PANEL
ALBUMIN: 3.6 g/dL (ref 3.5–5.0)
ALK PHOS: 78 U/L (ref 38–126)
ALT: 18 U/L (ref 17–63)
AST: 25 U/L (ref 15–41)
Anion gap: 14 (ref 5–15)
BUN: 41 mg/dL — ABNORMAL HIGH (ref 6–20)
CHLORIDE: 97 mmol/L — AB (ref 101–111)
CO2: 22 mmol/L (ref 22–32)
Calcium: 9.7 mg/dL (ref 8.9–10.3)
Creatinine, Ser: 1.75 mg/dL — ABNORMAL HIGH (ref 0.61–1.24)
GFR calc Af Amer: 38 mL/min — ABNORMAL LOW (ref >60)
GFR calc non Af Amer: 33 mL/min — ABNORMAL LOW (ref >60)
GLUCOSE: 180 mg/dL — AB (ref 65–99)
Potassium: 4.9 mmol/L (ref 3.5–5.1)
SODIUM: 133 mmol/L — AB (ref 135–145)
Total Bilirubin: 0.5 mg/dL (ref 0.3–1.2)
Total Protein: 6.3 g/dL — ABNORMAL LOW (ref 6.5–8.1)

## 2015-07-25 LAB — DIFFERENTIAL
BASOS PCT: 0 %
Basophils Absolute: 0 10*3/uL (ref 0.0–0.1)
EOS PCT: 3 %
Eosinophils Absolute: 0.2 10*3/uL (ref 0.0–0.7)
LYMPHS PCT: 22 %
Lymphs Abs: 1.5 10*3/uL (ref 0.7–4.0)
Monocytes Absolute: 0.6 10*3/uL (ref 0.1–1.0)
Monocytes Relative: 9 %
NEUTROS PCT: 66 %
Neutro Abs: 4.5 10*3/uL (ref 1.7–7.7)

## 2015-07-25 LAB — TROPONIN I
TROPONIN I: 0.03 ng/mL (ref <0.031)
Troponin I: 0.03 ng/mL (ref <0.031)

## 2015-07-25 LAB — APTT: APTT: 30 s (ref 24–37)

## 2015-07-25 MED ORDER — PRAVASTATIN SODIUM 40 MG PO TABS
40.0000 mg | ORAL_TABLET | Freq: Every day | ORAL | Status: DC
  Administered 2015-07-26: 40 mg via ORAL
  Filled 2015-07-25: qty 1

## 2015-07-25 MED ORDER — ENOXAPARIN SODIUM 30 MG/0.3ML ~~LOC~~ SOLN
30.0000 mg | SUBCUTANEOUS | Status: DC
  Administered 2015-07-25: 30 mg via SUBCUTANEOUS
  Filled 2015-07-25: qty 0.3

## 2015-07-25 MED ORDER — GABAPENTIN 100 MG PO CAPS
100.0000 mg | ORAL_CAPSULE | Freq: Two times a day (BID) | ORAL | Status: DC
  Administered 2015-07-25 – 2015-07-26 (×2): 100 mg via ORAL
  Filled 2015-07-25 (×2): qty 1

## 2015-07-25 MED ORDER — INSULIN ASPART 100 UNIT/ML ~~LOC~~ SOLN
0.0000 [IU] | Freq: Three times a day (TID) | SUBCUTANEOUS | Status: DC
  Administered 2015-07-26 (×2): 2 [IU] via SUBCUTANEOUS

## 2015-07-25 MED ORDER — GLYBURIDE-METFORMIN 2.5-500 MG PO TABS: 2.0000 | ORAL_TABLET | Freq: Every day | ORAL | Status: DC

## 2015-07-25 MED ORDER — TAMSULOSIN HCL 0.4 MG PO CAPS
0.4000 mg | ORAL_CAPSULE | Freq: Every day | ORAL | Status: DC
  Administered 2015-07-25: 0.4 mg via ORAL
  Filled 2015-07-25: qty 1

## 2015-07-25 MED ORDER — MAGNESIUM OXIDE 400 (241.3 MG) MG PO TABS
400.0000 mg | ORAL_TABLET | Freq: Every day | ORAL | Status: DC
  Administered 2015-07-26: 400 mg via ORAL
  Filled 2015-07-25: qty 1

## 2015-07-25 MED ORDER — VITAMIN C 500 MG PO TABS
500.0000 mg | ORAL_TABLET | Freq: Every day | ORAL | Status: DC
  Administered 2015-07-26: 500 mg via ORAL
  Filled 2015-07-25: qty 1

## 2015-07-25 MED ORDER — CALCIUM CARBONATE 1250 (500 CA) MG PO TABS
600.0000 mg | ORAL_TABLET | Freq: Every day | ORAL | Status: DC
  Administered 2015-07-26: 625 mg via ORAL
  Filled 2015-07-25: qty 1
  Filled 2015-07-25: qty 0.5

## 2015-07-25 MED ORDER — PROPYLENE GLYCOL 0.6 % OP SOLN: 1.0000 [drp] | Freq: Four times a day (QID) | OPHTHALMIC | Status: DC

## 2015-07-25 MED ORDER — METOPROLOL TARTRATE 12.5 MG HALF TABLET
12.5000 mg | ORAL_TABLET | Freq: Two times a day (BID) | ORAL | Status: DC
  Administered 2015-07-25 – 2015-07-26 (×2): 12.5 mg via ORAL
  Filled 2015-07-25 (×2): qty 1

## 2015-07-25 MED ORDER — STROKE: EARLY STAGES OF RECOVERY BOOK
Freq: Once | Status: AC
  Administered 2015-07-25
  Filled 2015-07-25: qty 1

## 2015-07-25 MED ORDER — LORAZEPAM 1 MG PO TABS
2.0000 mg | ORAL_TABLET | Freq: Every day | ORAL | Status: DC
  Administered 2015-07-25: 2 mg via ORAL
  Filled 2015-07-25: qty 2

## 2015-07-25 MED ORDER — ASPIRIN 325 MG PO TABS
325.0000 mg | ORAL_TABLET | Freq: Every day | ORAL | Status: DC
  Administered 2015-07-26: 325 mg via ORAL
  Filled 2015-07-25: qty 1

## 2015-07-25 MED ORDER — SENNOSIDES-DOCUSATE SODIUM 8.6-50 MG PO TABS
1.0000 | ORAL_TABLET | Freq: Two times a day (BID) | ORAL | Status: DC
  Administered 2015-07-25 – 2015-07-26 (×2): 1 via ORAL
  Filled 2015-07-25 (×3): qty 1

## 2015-07-25 MED ORDER — IRBESARTAN 150 MG PO TABS
150.0000 mg | ORAL_TABLET | Freq: Every day | ORAL | Status: DC
  Administered 2015-07-25 – 2015-07-26 (×2): 150 mg via ORAL
  Filled 2015-07-25 (×2): qty 1

## 2015-07-25 MED ORDER — POLYVINYL ALCOHOL 1.4 % OP SOLN
1.0000 [drp] | Freq: Four times a day (QID) | OPHTHALMIC | Status: DC
  Administered 2015-07-25 – 2015-07-26 (×2): 1 [drp] via OPHTHALMIC
  Filled 2015-07-25: qty 15

## 2015-07-25 MED ORDER — DULOXETINE HCL 30 MG PO CPEP: 30.0000 mg | ORAL_CAPSULE | Freq: Every day | ORAL | Status: DC

## 2015-07-25 MED ORDER — PRAMIPEXOLE DIHYDROCHLORIDE 0.25 MG PO TABS
0.5000 mg | ORAL_TABLET | Freq: Every day | ORAL | Status: DC
  Administered 2015-07-25: 0.5 mg via ORAL
  Filled 2015-07-25 (×2): qty 2

## 2015-07-25 NOTE — ED Notes (Signed)
Received pt from home with c/o weakness and abnormal speech. Pt LSN 0900. Pt symptoms progressed throughout the day. Pt had CVA in April.

## 2015-07-25 NOTE — ED Notes (Signed)
CBG 117 

## 2015-07-25 NOTE — Consult Note (Signed)
Chief Complaint: code stroke  HPI:                                                                                                                                         Kristopher Zimmerman is an 81 y.o. male with hx of HTN, HLP and DM who had a lacunar stroke back in April 2016 without any residual symptoms who today noticed some slurred speech and L sided weakness at around 9am  Date last known well: Date: 07/25/2015 Time last known well: Time: 09:00 tPA Given: No: presented outside tpa window No Symptoms         0 No significant disability/able to carry out all usual activities   1 Unable to carry out all previous activities but looks after own affairs             2 Requires help but walks without assistance     3 Unable to walk without assistance/unable to handle own bodily needs 4 Bedridden/incontinent        5 Dead          6  Modified Rankin: Rankin Score=0    Past Medical History  Diagnosis Date  . Diabetes mellitus without complication ([place])   . Neuropathy, diabetic ([place])   . Hypertension   . Renal insufficiency   . Stroke ([place])   . Macular degeneration of right eye     Past Surgical History  Procedure Laterality Date  . Back surgery    . Broken leg    . Cervical disc surgery    . Tonsillectomy    . Appendectomy      Family History  Problem Relation Age of Onset  . Cirrhosis Father   . Diabetes Brother   . Cancer - Lung Sister   . Alzheimer's disease Sister   . Heart disease Sister   . Cancer - Lung Brother    Social History:  reports that he quit smoking about 35 years ago. He does not have any smokeless tobacco history on file. He reports that he drinks about 0.6 oz of alcohol per week. He reports that he does not use illicit drugs.  Allergies: No Known Allergies  Medications:  I have reviewed the patient's current  medications.  ROS:                                                                                                                                       History obtained from the patient  General ROS: negative for - chills, fatigue, fever, night sweats, weight gain or weight loss Psychological ROS: negative for - behavioral disorder, hallucinations, memory difficulties, mood swings or suicidal ideation Ophthalmic ROS: negative for - blurry vision, double vision, eye pain or loss of vision ENT ROS: negative for - epistaxis, nasal discharge, oral lesions, sore throat, tinnitus or vertigo Allergy and Immunology ROS: negative for - hives or itchy/watery eyes Hematological and Lymphatic ROS: negative for - bleeding problems, bruising or swollen lymph nodes Endocrine ROS: negative for - galactorrhea, hair pattern changes, polydipsia/polyuria or temperature intolerance Respiratory ROS: negative for - cough, hemoptysis, shortness of breath or wheezing Cardiovascular ROS: negative for - chest pain, dyspnea on exertion, edema or irregular heartbeat Gastrointestinal ROS: negative for - abdominal pain, diarrhea, hematemesis, nausea/vomiting or stool incontinence Genito-Urinary ROS: negative for - dysuria, hematuria, incontinence or urinary frequency/urgency Musculoskeletal ROS: negative for - joint swelling or muscular weakness Neurological ROS: as noted in HPI Dermatological ROS: negative for rash and skin lesion changes  Neurologic Examination:                                                                                                      Blood pressure 163/78, pulse 80, temperature 97.7 F (36.5 C), temperature source Oral, resp. rate 18, height 5\' 10"  (1.778 m), weight 101.8 kg (224 lb 6.9 oz), SpO2 100 %.  HEENT-  Normocephalic, no lesions, without obvious abnormality.  Normal external eye and conjunctiva.  Normal TM's bilaterally.  Normal auditory canals and external ears. Normal external  nose, mucus membranes and septum.  Normal pharynx. Cardiovascular- regular rate and rhythm, S1, S2 normal, no murmur, click, rub or gallop, pulses palpable throughout   Lungs- chest clear, no wheezing, rales, normal symmetric air entry, Heart exam - S1, S2 normal, no murmur, no gallop, rate regular Abdomen- soft, non-tender; bowel sounds normal; no masses,  no organomegaly Extremities- no edema Lymph-no adenopathy palpable Musculoskeletal-no joint tenderness, deformity or swelling Skin-warm and dry, no hyperpigmentation, vitiligo, or suspicious lesions  Neurological Examination Mental Status: Alert, oriented, thought content appropriate.  Dysarthric speech.  Able to follow 3 step commands without difficulty. Cranial Nerves: II:  Visual fields grossly normal, pupils equal,  round, reactive to light and accommodation III,IV, VI: ptosis not present, extra-ocular motions intact bilaterally V,VII: smile mildly asymmetric, mild L facial droop, facial light touch sensation normal bilaterally VIII: hearing normal bilaterally IX,X: uvula rises symmetrically XI: bilateral shoulder shrug XII: midline tongue extension Motor: Right : Upper extremity   5/5    Left:     Upper extremity   3/5, pronator drift  Lower extremity   5/5     Lower extremity   2/5 Tone and bulk:normal tone throughout; no atrophy noted Sensory: Pinprick and light touch intact throughout, bilaterally Cerebellar: normal finger-to-nose      Lab Results: Basic Metabolic Panel:  Recent Labs Lab 07/25/15 1606 07/25/15 1611  NA 133* 133*  K 4.9 4.7  CL 97* 96*  CO2 22  --   GLUCOSE 180* 173*  BUN 41* 40*  CREATININE 1.75* 1.70*  CALCIUM 9.7  --     Liver Function Tests:  Recent Labs Lab 07/25/15 1606  AST 25  ALT 18  ALKPHOS 78  BILITOT 0.5  PROT 6.3*  ALBUMIN 3.6   No results for input(s): LIPASE, AMYLASE in the last 168 hours. No results for input(s): AMMONIA in the last 168 hours.  CBC:  Recent  Labs Lab 07/25/15 1606 07/25/15 1611  WBC 6.7  --   NEUTROABS 4.5  --   HGB 10.5* 11.6*  HCT 32.0* 34.0*  MCV 93.8  --   PLT 195  --     Cardiac Enzymes: No results for input(s): CKTOTAL, CKMB, CKMBINDEX, TROPONINI in the last 168 hours.  Lipid Panel: No results for input(s): CHOL, TRIG, HDL, CHOLHDL, VLDL, LDLCALC in the last 168 hours.  CBG: No results for input(s): GLUCAP in the last 168 hours.  Microbiology: No results found for this or any previous visit.  Coagulation Studies:  Recent Labs  07/25/15 1606  LABPROT 13.6  INR 1.02    Imaging: Ct Head Wo Contrast  07/25/2015  CLINICAL DATA:  Slurred speech.  Code stroke. EXAM: CT HEAD WITHOUT CONTRAST TECHNIQUE: Contiguous axial images were obtained from the base of the skull through the vertex without intravenous contrast. COMPARISON:  Head CT and brain MRI 11/28/2014. FINDINGS: There is no evidence of acute intracranial hemorrhage, mass lesion, brain edema or extra-axial fluid collection. The ventricles and subarachnoid spaces are appropriately sized for age. There is no CT evidence of acute cortical infarction. There are chronic small vessel ischemic changes in the periventricular white matter bilaterally which appear unchanged. Evolution of previously demonstrated small lacunar infarct in the left corona radiata. The visualized paranasal sinuses, mastoid air cells and middle ears are clear. The calvarium is intact. IMPRESSION: Expected evolution of previously demonstrated small lacunar infarct in the left corona radiata. No evidence of acute stroke or hemorrhage. These results were called by telephone at the time of interpretation on 07/25/2015 at 4:16 pm to Dr. Cristobal Goldmann, who verbally acknowledged these results. Electronically Signed   By: Richardean Sale M.D.   On: 07/25/2015 16:19      Assessment: 81 y.o. male with hx of HTN, HLP and DM who had a lacunar stroke back in April 2016 without any residual symptoms who today  noticed some slurred speech and L sided weakness at around 9am  - CTH negative Based on examination appears to have small lacunar infract in the posterior limb of the internal capsule on the R   Admit for stroke workup  - MRI brain - CTA head and neck - Echo -  PT/OT - Speech eval, NPO until then - ASA 325mg    Stroke Risk Factors - diabetes mellitus, hyperlipidemia and hypertension

## 2015-07-25 NOTE — ED Notes (Signed)
Transported to MRI

## 2015-07-25 NOTE — H&P (Addendum)
[facility] History and Physical  Kristopher Zimmerman [id number] DOB: November 08, 1923 DOA: 07/25/2015  Referring physician: *  PCP: Merrilee Seashore, MD   Chief complaint  Code stroke      HPI:  81 year old male was brought in by EMS as a code stroke. He was last seen normal at 9 AM. Apparently, over the course the day, he has been getting progressively weaker and having some slurred speech.arrived via EMS at 1602 Stat head CT was negative for acute stroke, MRI pending. NIHSS 5 mild facial droop, left arm drift 1 left leg 2 mild dysarthria He is having difficult moving his left side. He did have a stroke in May which gave him some right-sided weakness but had good resolution of symptoms. Denies headache, fever, chills. Denies chest pain, heaviness, tightness, pressure. Patient was seen by neurology , found to be a outside tpa window. Modified Rankin: Rankin Score=0.  CT head shows Expected evolution of previously demonstrated small lacunar infarct in the left corona radiata. No evidence of acute stroke or hemorrhage    General ROS: negative for - chills, fatigue, fever, night sweats, weight gain or weight loss Psychological ROS: negative for - behavioral disorder, hallucinations, memory difficulties, mood swings or suicidal ideation Ophthalmic ROS: negative for - blurry vision, double vision, eye pain or loss of vision ENT ROS: negative for - epistaxis, nasal discharge, oral lesions, sore throat, tinnitus or vertigo Allergy and Immunology ROS: negative for - hives or itchy/watery eyes Hematological and Lymphatic ROS: negative for - bleeding problems, bruising or swollen lymph nodes Endocrine ROS: negative for - galactorrhea, hair pattern changes, polydipsia/polyuria or temperature intolerance Respiratory ROS: negative for - cough, hemoptysis, shortness of breath or wheezing Cardiovascular ROS: negative for - chest pain, dyspnea on exertion, edema or irregular  heartbeat Gastrointestinal ROS: negative for - abdominal pain, diarrhea, hematemesis, nausea/vomiting or stool incontinence Genito-Urinary ROS: negative for - dysuria, hematuria, incontinence or urinary frequency/urgency Musculoskeletal ROS: negative for - joint swelling or muscular weakness Neurological ROS: as noted in HPI Dermatological ROS: negative for rash and skin lesion changes     Past Medical History  Diagnosis Date  . Diabetes mellitus without complication ([place])   . Neuropathy, diabetic ([place])   . Hypertension   . Renal insufficiency   . Stroke ([place])   . Macular degeneration of right eye      Past Surgical History  Procedure Laterality Date  . Back surgery    . Broken leg    . Cervical disc surgery    . Tonsillectomy    . Appendectomy        Social History:  reports that he quit smoking about 35 years ago. He does not have any smokeless tobacco history on file. He reports that he drinks about 0.6 oz of alcohol per week. He reports that he does not use illicit drugs.    No Known Allergies  Family History  Problem Relation Age of Onset  . Cirrhosis Father   . Diabetes Brother   . Cancer - Lung Sister   . Alzheimer's disease Sister   . Heart disease Sister   . Cancer - Lung Brother          Prior to Admission medications   Medication Sig Start Date End Date Taking? Authorizing Provider  aspirin 325 MG tablet Take 1 tablet (325 mg total) by mouth daily. 11/30/14   Orson Eva, MD  B Complex-C (B-COMPLEX WITH VITAMIN C) tablet Take 1 tablet by mouth daily.    Historical  Provider, MD  calcium carbonate (OS-CAL) 600 MG TABS Take 600 mg by mouth daily with breakfast.     Historical Provider, MD  Carboxymethylcellulose Sodium (REFRESH LIQUIGEL OP) Apply 1 drop to eye 4 (four) times daily.    Historical Provider, MD  cyclobenzaprine (FLEXERIL) 10 MG tablet Take 10 mg by mouth at bedtime.    Historical Provider, MD  doxycycline (VIBRAMYCIN) 100 MG capsule Take 100  mg by mouth daily. 12/24/14   Historical Provider, MD  DULoxetine (CYMBALTA) 30 MG capsule Take 30 mg by mouth daily. Take with a 60 to make 90    Historical Provider, MD  DULoxetine (CYMBALTA) 60 MG capsule Take 60 mg by mouth daily. Take with 30 to make 90    Historical Provider, MD  gabapentin (NEURONTIN) 100 MG capsule Take 100 mg by mouth 2 (two) times daily.    Historical Provider, MD  glyBURIDE-metformin (GLUCOVANCE) 2.5-500 MG per tablet Take 2 tablets by mouth daily with breakfast. 2 tabs at breakfast and 1 at [place] Provider, MD  hydrochlorothiazide (HYDRODIURIL) 25 MG tablet Take 25 mg by mouth daily.    Historical Provider, MD  irbesartan (AVAPRO) 150 MG tablet Take 150 mg by mouth daily.    Historical Provider, MD  LORazepam (ATIVAN) 2 MG tablet Take 2 mg by mouth at bedtime.    Historical Provider, MD  Magnesium Citrate 100 MG TABS Take 1 tablet by mouth daily.    Historical Provider, MD  meloxicam (MOBIC) 15 MG tablet Take 15 mg by mouth at bedtime.    Historical Provider, MD  metoprolol tartrate (LOPRESSOR) 25 MG tablet Take 0.5 tablets (12.5 mg total) by mouth 2 (two) times daily. 11/30/14   Orson Eva, MD  pramipexole (MIRAPEX) 0.5 MG tablet Take 0.5 mg by mouth at bedtime.     Historical Provider, MD  pravastatin (PRAVACHOL) 40 MG tablet Take 40 mg by mouth daily.    Historical Provider, MD  Propylene Glycol (SYSTANE BALANCE) 0.6 % SOLN Apply 1 drop to eye 4 (four) times daily.    Historical Provider, MD  tamsulosin (FLOMAX) 0.4 MG CAPS Take by mouth.    Historical Provider, MD  vitamin C (ASCORBIC ACID) 500 MG tablet Take 500 mg by mouth daily.    Historical Provider, MD     Physical Exam: Filed Vitals:   07/25/15 1630 07/25/15 1645 07/25/15 1700 07/25/15 1715  BP: 151/76 160/81 158/82 158/87  Pulse: 77   73  Temp:    97.6 F (36.4 C)  TempSrc:      Resp: 19 20 19 23   Height:      Weight:      SpO2: 97%   94%     Constitutional: Vital signs reviewed.  Patient is a well-developed and well-nourished in no acute distress and cooperative with exam. Alert and oriented x3.  Head: Normocephalic and atraumatic  Ear: TM normal bilaterally  Mouth: no erythema or exudates, MMM  Eyes: PERRL, EOMI, conjunctivae normal, No scleral icterus.  Neck: Supple, Trachea midline normal ROM, No JVD, mass, thyromegaly, or carotid bruit present.  Cardiovascular: RRR, S1 normal, S2 normal, no MRG, pulses symmetric and intact bilaterally  Pulmonary/Chest: CTAB, no wheezes, rales, or rhonchi  Abdominal: Soft. Non-tender, non-distended, bowel sounds are normal, no masses, organomegaly, or guarding present.  GU: no CVA tenderness Musculoskeletal: No joint deformities, erythema, or stiffness, ROM full and no nontender Ext: no edema and no cyanosis, pulses palpable bilaterally (DP and PT)  Hematology:  no cervical, inginal, or axillary adenopathy.  Neurologic: He is awake and alert, speech is mildly dysarthric but is fluent and content appropriate. Mild left central facial droop is present. There is mild weakness of the left upper arm and moderate weakness of the left leg with a left Babinski reflex present. Skin: Warm, dry and intact. No rash, cyanosis, or clubbing.  Psychiatric: Normal mood and affect. speech and behavior is normal. Judgment and thought content normal. Cognition and memory are normal.      Data Review   Micro Results No results found for this or any previous visit (from the past 240 hour(s)).  Radiology Reports Ct Head Wo Contrast  07/25/2015  CLINICAL DATA:  Slurred speech.  Code stroke. EXAM: CT HEAD WITHOUT CONTRAST TECHNIQUE: Contiguous axial images were obtained from the base of the skull through the vertex without intravenous contrast. COMPARISON:  Head CT and brain MRI 11/28/2014. FINDINGS: There is no evidence of acute intracranial hemorrhage, mass lesion, brain edema or extra-axial fluid collection. The ventricles and subarachnoid spaces are  appropriately sized for age. There is no CT evidence of acute cortical infarction. There are chronic small vessel ischemic changes in the periventricular white matter bilaterally which appear unchanged. Evolution of previously demonstrated small lacunar infarct in the left corona radiata. The visualized paranasal sinuses, mastoid air cells and middle ears are clear. The calvarium is intact. IMPRESSION: Expected evolution of previously demonstrated small lacunar infarct in the left corona radiata. No evidence of acute stroke or hemorrhage. These results were called by telephone at the time of interpretation on 07/25/2015 at 4:16 pm to Dr. Cristobal Goldmann, who verbally acknowledged these results. Electronically Signed   By: Richardean Sale M.D.   On: 07/25/2015 16:19     CBC  Recent Labs Lab 07/25/15 1606 07/25/15 1611  WBC 6.7  --   HGB 10.5* 11.6*  HCT 32.0* 34.0*  PLT 195  --   MCV 93.8  --   MCH 30.8  --   MCHC 32.8  --   RDW 13.0  --   LYMPHSABS 1.5  --   MONOABS 0.6  --   EOSABS 0.2  --   BASOSABS 0.0  --     Chemistries   Recent Labs Lab 07/25/15 1606 07/25/15 1611  NA 133* 133*  K 4.9 4.7  CL 97* 96*  CO2 22  --   GLUCOSE 180* 173*  BUN 41* 40*  CREATININE 1.75* 1.70*  CALCIUM 9.7  --   AST 25  --   ALT 18  --   ALKPHOS 78  --   BILITOT 0.5  --    ------------------------------------------------------------------------------------------------------------------ estimated creatinine clearance is 35.2 mL/min (by C-G formula based on Cr of 1.7). ------------------------------------------------------------------------------------------------------------------ No results for input(s): HGBA1C in the last 72 hours. ------------------------------------------------------------------------------------------------------------------ No results for input(s): CHOL, HDL, LDLCALC, TRIG, CHOLHDL, LDLDIRECT in the last 72  hours. ------------------------------------------------------------------------------------------------------------------ No results for input(s): TSH, T4TOTAL, T3FREE, THYROIDAB in the last 72 hours.  Invalid input(s): FREET3 ------------------------------------------------------------------------------------------------------------------ No results for input(s): VITAMINB12, FOLATE, FERRITIN, TIBC, IRON, RETICCTPCT in the last 72 hours.  Coagulation profile  Recent Labs Lab 07/25/15 1606  INR 1.02    No results for input(s): DDIMER in the last 72 hours.  Cardiac Enzymes No results for input(s): CKMB, TROPONINI, MYOGLOBIN in the last 168 hours.  Invalid input(s): CK ------------------------------------------------------------------------------------------------------------------ Invalid input(s): POCBNP   CBG: No results for input(s): GLUCAP in the last 168 hours.     Date/Time: Sunday July 25 2015  16:26:06 EST Ventricular Rate: 75 PR Interval: 191 QRS Duration: 93 QT Interval: 358 QTC Calculation: 400 R Axis: 62 Text Interpretation: Sinus rhythm Borderline low voltage, extremity leads  Minimal ST elevation, inferior leads When compared with ECG of 11/28/2014,  HEART RATE has decreased    Assessment/Plan Principal Problem:   Acute CVA (cerebrovascular accident) (HCC)-based on neurology exam patient appears to have a small lacunar infarct in the posterior limb of the internal capsule on the right Admit for stroke workup, - MRI brain - CTA head and neck - Echo - PT/OT - Speech eval, NPO until then - ASA 325mg  Lipid panel   Hypertension-patient on hydrochlorothiazide, avapro, metoprolol, continue these medications except hydrochlorothiazide    Aortic stenosis-repeat 2-D echo, continue beta blocker    Diabetes mellitus (HCC)-hold metformin, patient started on sliding scale insulin, check hemoglobin A1c    Chronic kidney disease (CKD) stage 3,  mildly decreased glomerular filtration rate (GFR) 42 , baseline creatinine 1.5 , creatinine slightly elevated    Diabetic neuropathy (HCC)-continue gabapentin    Code Status:   full Family Communication: bedside Disposition Plan: admit   Total time spent 55 minutes.Greater than 50% of this time was spent in counseling, explanation of diagnosis, planning of further management, and coordination of care  [facility] Pager [phone]  If 7PM-7AM, please contact night-coverage [email or web address] Password [facility] 07/25/2015, 5:41 PM

## 2015-07-25 NOTE — ED Notes (Signed)
Remains in MRI.  Dr. Allyson Sabal wants to be paged and eval pt prior to being transported to room.  Pager # [phone]

## 2015-07-25 NOTE — Progress Notes (Signed)
Patient admitted to 50M room 7 alert and oriented to unit and room Tele box # verified X2 persons .

## 2015-07-25 NOTE — Code Documentation (Signed)
Code Stroke called at [facility] this morning at 0900, per wife and patient he has progressively gotten weaker as the day has progressed.  Patient arrived via EMS at 1602  Stat head CT and Stat labs done.  NIHSS 5 mild facial droop, left arm drift 1 left leg 2 mild dysarthria.  Wife at bedside.  MD at bedside to assess patient.  Patient outside TPA window.  Plan:  MRI and hospital admission. Rn to call if assistance needed.

## 2015-07-25 NOTE — ED Provider Notes (Signed)
CSN: [id number]     Arrival date & time 07/25/15  1602 History   First MD Initiated Contact with Patient 07/25/15 1607     Chief Complaint  Patient presents with  . Code Stroke    An emergency department physician performed an initial assessment on this suspected stroke patient at 48. (Consider location/radiation/quality/duration/timing/severity/associated sxs/prior Treatment) The history is provided by the patient, the EMS personnel and a relative.  81 year old male was brought in by EMS as a code stroke. He was last seen normal at 9 AM. Apparently, over the course the day, he has been getting progressively weaker and having some slurred speech. He is having difficult moving his left side. He did have a stroke in May which gave him some right-sided weakness but had good resolution of symptoms. Denies headache, fever, chills. Denies chest pain, heaviness, tightness, pressure. There's been no nausea or vomiting.  Past Medical History  Diagnosis Date  . Diabetes mellitus without complication ([place])   . Neuropathy, diabetic ([place])   . Hypertension   . Renal insufficiency   . Stroke ([place])   . Macular degeneration of right eye    Past Surgical History  Procedure Laterality Date  . Back surgery    . Broken leg    . Cervical disc surgery    . Tonsillectomy    . Appendectomy     Family History  Problem Relation Age of Onset  . Cirrhosis Father   . Diabetes Brother   . Cancer - Lung Sister   . Alzheimer's disease Sister   . Heart disease Sister   . Cancer - Lung Brother    Social History  Substance Use Topics  . Smoking status: Former Smoker    Quit date: 12/22/1979  . Smokeless tobacco: None  . Alcohol Use: 0.6 oz/week    1 Glasses of wine per week     Comment: wine    Review of Systems  All other systems reviewed and are negative.     Allergies  Review of patient's allergies indicates no known allergies.  Home Medications   Prior to Admission medications    Medication Sig Start Date End Date Taking? Authorizing Provider  aspirin 325 MG tablet Take 1 tablet (325 mg total) by mouth daily. 11/30/14   Orson Eva, MD  B Complex-C (B-COMPLEX WITH VITAMIN C) tablet Take 1 tablet by mouth daily.    Historical Provider, MD  calcium carbonate (OS-CAL) 600 MG TABS Take 600 mg by mouth daily with breakfast.     Historical Provider, MD  Carboxymethylcellulose Sodium (REFRESH LIQUIGEL OP) Apply 1 drop to eye 4 (four) times daily.    Historical Provider, MD  cyclobenzaprine (FLEXERIL) 10 MG tablet Take 10 mg by mouth at bedtime.    Historical Provider, MD  doxycycline (VIBRAMYCIN) 100 MG capsule Take 100 mg by mouth daily. 12/24/14   Historical Provider, MD  DULoxetine (CYMBALTA) 30 MG capsule Take 30 mg by mouth daily. Take with a 60 to make 90    Historical Provider, MD  DULoxetine (CYMBALTA) 60 MG capsule Take 60 mg by mouth daily. Take with 30 to make 90    Historical Provider, MD  gabapentin (NEURONTIN) 100 MG capsule Take 100 mg by mouth 2 (two) times daily.    Historical Provider, MD  glyBURIDE-metformin (GLUCOVANCE) 2.5-500 MG per tablet Take 2 tablets by mouth daily with breakfast. 2 tabs at breakfast and 1 at [place] Provider, MD  hydrochlorothiazide (HYDRODIURIL) 25 MG tablet  Take 25 mg by mouth daily.    Historical Provider, MD  irbesartan (AVAPRO) 150 MG tablet Take 150 mg by mouth daily.    Historical Provider, MD  LORazepam (ATIVAN) 2 MG tablet Take 2 mg by mouth at bedtime.    Historical Provider, MD  Magnesium Citrate 100 MG TABS Take 1 tablet by mouth daily.    Historical Provider, MD  meloxicam (MOBIC) 15 MG tablet Take 15 mg by mouth at bedtime.    Historical Provider, MD  metoprolol tartrate (LOPRESSOR) 25 MG tablet Take 0.5 tablets (12.5 mg total) by mouth 2 (two) times daily. 11/30/14   Orson Eva, MD  pramipexole (MIRAPEX) 0.5 MG tablet Take 0.5 mg by mouth at bedtime.     Historical Provider, MD  pravastatin (PRAVACHOL) 40 MG tablet  Take 40 mg by mouth daily.    Historical Provider, MD  Propylene Glycol (SYSTANE BALANCE) 0.6 % SOLN Apply 1 drop to eye 4 (four) times daily.    Historical Provider, MD  tamsulosin (FLOMAX) 0.4 MG CAPS Take by mouth.    Historical Provider, MD  vitamin C (ASCORBIC ACID) 500 MG tablet Take 500 mg by mouth daily.    Historical Provider, MD   BP 163/78 mmHg  Pulse 80  Temp(Src) 97.7 F (36.5 C) (Oral)  Resp 18  Ht 5\' 10"  (1.778 m)  Wt 224 lb 6.9 oz (101.8 kg)  BMI 32.20 kg/m2  SpO2 100% Physical Exam  Nursing note and vitals reviewed.  81 year old male, resting comfortably and in no acute distress. Vital signs are significant for hypertension. Oxygen saturation is 100%, which is normal. Head is normocephalic and atraumatic. PERRLA, EOMI. Oropharynx is clear. Neck is nontender and supple without adenopathy or JVD. There are no carotid bruits. Back is nontender and there is no CVA tenderness. Lungs are clear without rales, wheezes, or rhonchi. Chest is nontender. Heart has regular rate and rhythm with 2/6 systolic ejection murmur heard at the upper left sternal border. Abdomen is soft, flat, nontender without masses or hepatosplenomegaly and peristalsis is normoactive. Extremities have 1+ edema, full range of motion is present. Skin is warm and dry without rash. Neurologic: He is awake and alert, speech is mildly dysarthric but is fluent and content appropriate. Mild left central facial droop is present. There is mild weakness of the left upper arm and moderate weakness of the left leg with a left Babinski reflex present.  ED Course  Procedures (including critical care time) Labs Review Results for orders placed or performed during the hospital encounter of 07/25/15  Protime-INR  Result Value Ref Range   Prothrombin Time 13.6 11.6 - 15.2 seconds   INR 1.02 0.00 - 1.49  APTT  Result Value Ref Range   aPTT 30 24 - 37 seconds  CBC  Result Value Ref Range   WBC 6.7 4.0 - 10.5 K/uL    RBC 3.41 (L) 4.22 - 5.81 MIL/uL   Hemoglobin 10.5 (L) 13.0 - 17.0 g/dL   HCT 32.0 (L) 39.0 - 52.0 %   MCV 93.8 78.0 - 100.0 fL   MCH 30.8 26.0 - 34.0 pg   MCHC 32.8 30.0 - 36.0 g/dL   RDW 13.0 11.5 - 15.5 %   Platelets 195 150 - 400 K/uL  Differential  Result Value Ref Range   Neutrophils Relative % 66 %   Neutro Abs 4.5 1.7 - 7.7 K/uL   Lymphocytes Relative 22 %   Lymphs Abs 1.5 0.7 - 4.0 K/uL  Monocytes Relative 9 %   Monocytes Absolute 0.6 0.1 - 1.0 K/uL   Eosinophils Relative 3 %   Eosinophils Absolute 0.2 0.0 - 0.7 K/uL   Basophils Relative 0 %   Basophils Absolute 0.0 0.0 - 0.1 K/uL  Comprehensive metabolic panel  Result Value Ref Range   Sodium 133 (L) 135 - 145 mmol/L   Potassium 4.9 3.5 - 5.1 mmol/L   Chloride 97 (L) 101 - 111 mmol/L   CO2 22 22 - 32 mmol/L   Glucose, Bld 180 (H) 65 - 99 mg/dL   BUN 41 (H) 6 - 20 mg/dL   Creatinine, Ser 1.75 (H) 0.61 - 1.24 mg/dL   Calcium 9.7 8.9 - 10.3 mg/dL   Total Protein 6.3 (L) 6.5 - 8.1 g/dL   Albumin 3.6 3.5 - 5.0 g/dL   AST 25 15 - 41 U/L   ALT 18 17 - 63 U/L   Alkaline Phosphatase 78 38 - 126 U/L   Total Bilirubin 0.5 0.3 - 1.2 mg/dL   GFR calc non Af Amer 33 (L) >60 mL/min   GFR calc Af Amer 38 (L) >60 mL/min   Anion gap 14 5 - 15  I-stat troponin, ED (not at [facility])  Result Value Ref Range   Troponin i, poc 0.00 0.00 - 0.08 ng/mL   Comment 3          I-Stat Chem 8, ED  (not at [facility])  Result Value Ref Range   Sodium 133 (L) 135 - 145 mmol/L   Potassium 4.7 3.5 - 5.1 mmol/L   Chloride 96 (L) 101 - 111 mmol/L   BUN 40 (H) 6 - 20 mg/dL   Creatinine, Ser 1.70 (H) 0.61 - 1.24 mg/dL   Glucose, Bld 173 (H) 65 - 99 mg/dL   Calcium, Ion 1.14 1.13 - 1.30 mmol/L   TCO2 23 0 - 100 mmol/L   Hemoglobin 11.6 (L) 13.0 - 17.0 g/dL   HCT 34.0 (L) 39.0 - 52.0 %   Ct Head Wo Contrast  07/25/2015  CLINICAL DATA:  Slurred speech.  Code stroke. EXAM: CT HEAD WITHOUT CONTRAST TECHNIQUE: Contiguous axial images were  obtained from the base of the skull through the vertex without intravenous contrast. COMPARISON:  Head CT and brain MRI 11/28/2014. FINDINGS: There is no evidence of acute intracranial hemorrhage, mass lesion, brain edema or extra-axial fluid collection. The ventricles and subarachnoid spaces are appropriately sized for age. There is no CT evidence of acute cortical infarction. There are chronic small vessel ischemic changes in the periventricular white matter bilaterally which appear unchanged. Evolution of previously demonstrated small lacunar infarct in the left corona radiata. The visualized paranasal sinuses, mastoid air cells and middle ears are clear. The calvarium is intact. IMPRESSION: Expected evolution of previously demonstrated small lacunar infarct in the left corona radiata. No evidence of acute stroke or hemorrhage. These results were called by telephone at the time of interpretation on 07/25/2015 at 4:16 pm to Dr. Cristobal Goldmann, who verbally acknowledged these results. Electronically Signed   By: Richardean Sale M.D.   On: 07/25/2015 16:19      Imaging Review Ct Head Wo Contrast  07/25/2015  CLINICAL DATA:  Slurred speech.  Code stroke. EXAM: CT HEAD WITHOUT CONTRAST TECHNIQUE: Contiguous axial images were obtained from the base of the skull through the vertex without intravenous contrast. COMPARISON:  Head CT and brain MRI 11/28/2014. FINDINGS: There is no evidence of acute intracranial hemorrhage, mass lesion, brain edema or extra-axial  fluid collection. The ventricles and subarachnoid spaces are appropriately sized for age. There is no CT evidence of acute cortical infarction. There are chronic small vessel ischemic changes in the periventricular white matter bilaterally which appear unchanged. Evolution of previously demonstrated small lacunar infarct in the left corona radiata. The visualized paranasal sinuses, mastoid air cells and middle ears are clear. The calvarium is intact. IMPRESSION:  Expected evolution of previously demonstrated small lacunar infarct in the left corona radiata. No evidence of acute stroke or hemorrhage. These results were called by telephone at the time of interpretation on 07/25/2015 at 4:16 pm to Dr. Cristobal Goldmann, who verbally acknowledged these results. Electronically Signed   By: Richardean Sale M.D.   On: 07/25/2015 16:19   I have personally reviewed and evaluated these images and lab results as part of my medical decision-making.   EKG Interpretation   Date/Time:  Sunday July 25 2015 16:26:06 EST Ventricular Rate:  75 PR Interval:  191 QRS Duration: 93 QT Interval:  358 QTC Calculation: 400 R Axis:   62 Text Interpretation:  Sinus rhythm Borderline low voltage, extremity leads  Minimal ST elevation, inferior leads When compared with ECG of 11/28/2014,  HEART RATE has decreased Confirmed by [facility]  MD, Calina Patrie ([id number]) on  07/25/2015 5:07:17 PM      CRITICAL CARE Performed by: [id number] Total critical care time: 40 minutes Critical care time was exclusive of separately billable procedures and treating other patients. Critical care was necessary to treat or prevent imminent or life-threatening deterioration. Critical care was time spent personally by me on the following activities: development of treatment plan with patient and/or surrogate as well as nursing, discussions with consultants, evaluation of patient's response to treatment, examination of patient, obtaining history from patient or surrogate, ordering and performing treatments and interventions, ordering and review of laboratory studies, ordering and review of radiographic studies, pulse oximetry and re-evaluation of patient's condition.  MDM   Final diagnoses:  Acute on chronic renal insufficiency (HCC)    Apparent right hemisphere stroke. Old records are reviewed confirming hospitalization for stroke last May. He is outside the timeframe for thrombolytic treatment and deficit is  not great enough to warrant interventional radiology procedure. Laboratory workup does show elevation of creatinine compared with baseline but at a level that he had been at before. Case is discussed with Dr. Cristobal Goldmann of neurology service who requests patient be admitted to the hospitalist service. Case is discussed with Dr. Allyson Sabal of [facility] who agrees to admit the patient.    Delora Fuel, MD [phone]

## 2015-07-26 ENCOUNTER — Inpatient Hospital Stay (HOSPITAL_COMMUNITY): Payer: Medicare Other

## 2015-07-26 DIAGNOSIS — I6789 Other cerebrovascular disease: Secondary | ICD-10-CM

## 2015-07-26 DIAGNOSIS — E1142 Type 2 diabetes mellitus with diabetic polyneuropathy: Secondary | ICD-10-CM

## 2015-07-26 DIAGNOSIS — N189 Chronic kidney disease, unspecified: Secondary | ICD-10-CM

## 2015-07-26 DIAGNOSIS — E1159 Type 2 diabetes mellitus with other circulatory complications: Secondary | ICD-10-CM

## 2015-07-26 DIAGNOSIS — N289 Disorder of kidney and ureter, unspecified: Secondary | ICD-10-CM

## 2015-07-26 DIAGNOSIS — E785 Hyperlipidemia, unspecified: Secondary | ICD-10-CM

## 2015-07-26 LAB — BASIC METABOLIC PANEL
ANION GAP: 10 (ref 5–15)
BUN: 32 mg/dL — AB (ref 6–20)
CALCIUM: 10.1 mg/dL (ref 8.9–10.3)
CO2: 28 mmol/L (ref 22–32)
CREATININE: 1.35 mg/dL — AB (ref 0.61–1.24)
Chloride: 99 mmol/L — ABNORMAL LOW (ref 101–111)
GFR calc Af Amer: 52 mL/min — ABNORMAL LOW (ref >60)
GFR, EST NON AFRICAN AMERICAN: 45 mL/min — AB (ref >60)
GLUCOSE: 173 mg/dL — AB (ref 65–99)
Potassium: 4.4 mmol/L (ref 3.5–5.1)
Sodium: 137 mmol/L (ref 135–145)

## 2015-07-26 LAB — TROPONIN I

## 2015-07-26 LAB — GLUCOSE, CAPILLARY
GLUCOSE-CAPILLARY: 171 mg/dL — AB (ref 65–99)
GLUCOSE-CAPILLARY: 55 mg/dL — AB (ref 65–99)
GLUCOSE-CAPILLARY: 93 mg/dL (ref 65–99)
Glucose-Capillary: 151 mg/dL — ABNORMAL HIGH (ref 65–99)

## 2015-07-26 LAB — LIPID PANEL
Cholesterol: 103 mg/dL (ref 0–200)
HDL: 36 mg/dL — ABNORMAL LOW (ref >40)
LDL CALC: 42 mg/dL (ref 0–99)
Total CHOL/HDL Ratio: 2.9 RATIO
Triglycerides: 124 mg/dL (ref <150)
VLDL: 25 mg/dL (ref 0–40)

## 2015-07-26 MED ORDER — CLOPIDOGREL BISULFATE 75 MG PO TABS
75.0000 mg | ORAL_TABLET | Freq: Every day | ORAL | Status: DC
  Administered 2015-07-26: 75 mg via ORAL
  Filled 2015-07-26 (×2): qty 1

## 2015-07-26 MED ORDER — CLOPIDOGREL BISULFATE 75 MG PO TABS: 75.0000 mg | ORAL_TABLET | Freq: Every day | ORAL | Status: AC

## 2015-07-26 MED ORDER — DEXTROSE 50 % IV SOLN
INTRAVENOUS | Status: AC
  Filled 2015-07-26: qty 50

## 2015-07-26 NOTE — Progress Notes (Signed)
Hypoglycemic Event  CBG:55  Treatment: Hypoglycemic Event  CBG: 55  Treatment: snack  Symptoms: None  Follow-up CBG: Time12.20am CBG Result:93  Possible Reasons for Event: Inadequate meal intake  Comments/MD notified Huntley Estelle, Antanisha Mohs Sipiwe    Symptoms: None  Follow-up CBG: Time:12;20 CBG Result:93  Possible Reasons for Event: Inadequate meal intake  Comments/MD notified:yes    Naoko Diperna Sipiwe

## 2015-07-26 NOTE — Progress Notes (Signed)
  Echocardiogram 2D Echocardiogram has been performed.  Donata Clay 07/26/2015, 1:50 PM

## 2015-07-26 NOTE — Progress Notes (Signed)
STROKE TEAM PROGRESS NOTE   HISTORY Kristopher Zimmerman is an 81 y.o. male with hx of HTN, HLP and DM who had a lacunar stroke back in April 2016 without any residual symptoms who today noticed some slurred speech and L sided weakness at around 9am (LKW). Presented. Modified Rankin: Rankin Score=0. Patient was not administered TPA secondary to outside tpa window. He was admitted for further evaluation and treatment.   SUBJECTIVE (INTERVAL HISTORY) His wife Kristopher Zimmerman is at the bedside.  Overall he feels his condition is completely resolved - "I'm 100% better compared with yesterday". Still has left ataxia, PT recommend home PT/OT. Pt is anxious to go home.   OBJECTIVE Temp:  [97 F (36.1 C)-98.8 F (37.1 C)] 98 F (36.7 C) (12/26 1106) Pulse Rate:  [61-82] 70 (12/26 1106) Cardiac Rhythm:  [-] Heart block (12/26 0700) Resp:  [18-23] 20 (12/26 1106) BP: (139-173)/(67-87) 139/67 mmHg (12/26 1106) SpO2:  [94 %-100 %] 94 % (12/26 1106) Weight:  [95 kg (209 lb 7 oz)-101.8 kg (224 lb 6.9 oz)] 95 kg (209 lb 7 oz) (12/25 1935)  CBC:   Recent Labs Lab 07/25/15 1606 07/25/15 1611  WBC 6.7  --   NEUTROABS 4.5  --   HGB 10.5* 11.6*  HCT 32.0* 34.0*  MCV 93.8  --   PLT 195  --     Basic Metabolic Panel:   Recent Labs Lab 07/25/15 1606 07/25/15 1611  NA 133* 133*  K 4.9 4.7  CL 97* 96*  CO2 22  --   GLUCOSE 180* 173*  BUN 41* 40*  CREATININE 1.75* 1.70*  CALCIUM 9.7  --     Lipid Panel:     Component Value Date/Time   CHOL 103 07/26/2015 0620   TRIG 124 07/26/2015 0620   HDL 36* 07/26/2015 0620   CHOLHDL 2.9 07/26/2015 0620   VLDL 25 07/26/2015 0620   LDLCALC 42 07/26/2015 0620   HgbA1c:  Lab Results  Component Value Date   HGBA1C 7.0* 11/29/2014   Urine Drug Screen:     Component Value Date/Time   LABOPIA POSITIVE* 11/28/2014 1700   COCAINSCRNUR NONE DETECTED 11/28/2014 1700   LABBENZ POSITIVE* 11/28/2014 1700   AMPHETMU NONE DETECTED 11/28/2014 1700   THCU NONE  DETECTED 11/28/2014 1700   LABBARB NONE DETECTED 11/28/2014 1700      IMAGING I have personally reviewed the radiological images below and agree with the radiology interpretations.  Dg Chest 2 View 07/25/2015  No active cardiopulmonary disease.   Ct Head Wo Contrast 07/25/2015  Expected evolution of previously demonstrated small lacunar infarct in the left corona radiata. No evidence of acute stroke or hemorrhage.   MRI Brain 07/25/2015  Acute infarct right pons. Atrophy and chronic microvascular ischemia   MRA Head and Neck 07/25/2015 Negative MRA of the head and neck.   2D echo Left ventricle: The cavity size was normal. There was mild focal basal hypertrophy of the septum. Systolic function was normal. The estimated ejection fraction was in the range of 55% to 60%. Wall motion was normal; there were no regional wall motion abnormalities. Doppler parameters are consistent with abnormal left ventricular relaxation (grade 1 diastolic dysfunction). - Aortic valve: Trileaflet; mildly thickened, mildly calcified leaflets. Transvalvular velocity was minimally increased. There was no stenosis. - Mitral valve: Calcified annulus. - Left atrium: The atrium was mildly dilated. Impressions: - Compared to the prior study, there has been no significant interval change.   PHYSICAL EXAM  Temp:  [97.5 F (  36.4 C)-98 F (36.7 C)] 97.9 F (36.6 C) (12/26 1315) Pulse Rate:  [64-82] 67 (12/26 1315) Resp:  [18-20] 18 (12/26 1315) BP: (139-169)/(67-79) 151/72 mmHg (12/26 1315) SpO2:  [94 %-99 %] 95 % (12/26 1315)  General - Well nourished, well developed, in no apparent distress.  Ophthalmologic - Fundi not visualized due to eye movement.  Cardiovascular - Regular rate and rhythm with no murmur.  Mental Status -  Level of arousal and orientation to time, place, and person were intact. Language including expression, naming, repetition, comprehension was assessed and  found intact. Fund of Knowledge was assessed and was intact.  Cranial Nerves II - XII - II - Visual field intact OU. III, IV, VI - Extraocular movements intact. V - Facial sensation intact bilaterally. VII - Facial movement intact bilaterally. VIII - Hearing & vestibular intact bilaterally. X - Palate elevates symmetrically. XI - Chin turning & shoulder shrug intact bilaterally. XII - Tongue protrusion intact.  Motor Strength - The patient's strength was normal in all extremities and pronator drift was absent.  Bulk was normal and fasciculations were absent.   Motor Tone - Muscle tone was assessed at the neck and appendages and was normal.  Reflexes - The patient's reflexes were 1+ in all extremities and he had no pathological reflexes.  Sensory - Light touch, temperature/pinprick were assessed and were symmetrical.    Coordination - The patient had significant left hand and leg ataxia with FTN and HTS.  Tremor was absent.  Gait and Station - not tested due to safety concerns.   ASSESSMENT/PLAN [name] is a 81 y.o. male with history of HTN, HLD and DM with previous lacunar stroke in April 2016 presenting with slurred speech and L sided weakness. He did not receive IV t-PA due to outside tpa window.   Stroke:  Non-dominant right pontine infarct  secondary to small vessel disease source  Resultant  Left sided ataxia  MRI  R pontine infarct  MRA head & neck  Unremarkable   2D Echo EF 55-60%   LDL 42  HgbA1c 7.4  Lovenox 30 mg sq daily for VTE prophylaxis Diet heart healthy/carb modified Room service appropriate?: Yes; Fluid consistency:: Thin  aspirin 325 mg daily prior to admission, now on aspirin 325 mg daily. Given stroke on aspirin, will discontinue and start on plavix for secondary stroke prevention  Patient counseled to be compliant with his antithrombotic medications  Ongoing aggressive stroke risk factor management  Therapy recommendations:  HH  PT  Disposition:  Plan home with [place] for discharge from stroke standpoint once 2D completed. Follow up Dr. Leonie Man. Order written.  Hypertension  Stable Permissive hypertension (OK if < 220/120) but gradually normalize in 5-7 days Encourage pt to check BP at home and record and bring over to PCP for medication adjustment if needed.  Hyperlipidemia  Home meds:  pravachol 40, resumed in hospital  LDL 42, goal < 70  Continue statin at discharge  Diabetes Diabetic neuropathy  HgbA1c 7.4, goal < 7.0  On gabapentin  DM not in good control  Need follow up PCP closely   Other Stroke Risk Factors  Advanced age  ETOH use  Former Cigarette smoker, quit smoking 35 years ago   Obesity, Body mass index is 30.91 kg/(m^2).   Hx stroke/TIA - 11/2014 L basal ganglia infarct due to small vessel disease   Other Active Problems  Aortic stenosis  Chronic kidney disease stage III, 1.7, irbesartan on hold  Hospital day # 1  Neurology will sign off. Please call with questions. Pt will follow up with Dr. Leonie Man at [facility] in about 2 months. Thanks for the consult.  Rosalin Hawking, MD PhD Stroke Neurology 07/27/2015 6:22 AM     To contact Stroke Continuity provider, please refer to [email or web address] After hours, contact General Neurology

## 2015-07-26 NOTE — Evaluation (Signed)
Physical Therapy Evaluation Patient Details Name: Kristopher Zimmerman MRN: [id number] DOB: [birth year] Today's Date: 07/26/2015   History of Present Illness  Pt is an 81 y/o male who presents with L sided weakness. MRI revealed an acute infarct in the R pons.  Clinical Impression  Pt admitted with above diagnosis. Pt currently with functional limitations due to the deficits listed below (see PT Problem List). At the time of PT eval pt was able to perform transfers and ambulation with min assist for balance/support. It appears that pt has some minor gait deviations/deficits at baseline, however has been independent at home with occasional use of a cane prior to this admission. Pt will have wife available at home for assistance if needed at d/c. Pt will benefit from skilled PT to increase their independence and safety with mobility to allow discharge to the venue listed below.       Follow Up Recommendations Home health PT;Supervision for mobility/OOB    Equipment Recommendations  None recommended by PT    Recommendations for Other Services       Precautions / Restrictions Precautions Precautions: Fall Restrictions Weight Bearing Restrictions: No      Mobility  Bed Mobility Overal bed mobility: Needs Assistance Bed Mobility: Supine to Sit     Supine to sit: Min assist     General bed mobility comments: Assist for trunk elevation to full sitting position.   Transfers Overall transfer level: Needs assistance Equipment used: Rolling walker (2 wheeled) Transfers: Sit to/from Stand Sit to Stand: Min assist         General transfer comment: Steadying assist for balance and support as pt powered up to full standing position. Use of RW for UE support provided once standing.   Ambulation/Gait Ambulation/Gait assistance: Min assist Ambulation Distance (Feet): 125 Feet Assistive device: Rolling walker (2 wheeled) Gait Pattern/deviations: Step-through pattern;Decreased stride  length;Decreased weight shift to left;Decreased stance time - left Gait velocity: Decreased Gait velocity interpretation: Below normal speed for age/gender General Gait Details: Pt was cued for increased step/stride length. States he usually takes small steps. Noted unequal steps but pt was able to correct with cues.  Stairs            Wheelchair Mobility    Modified Rankin (Stroke Patients Only)       Balance Overall balance assessment: Needs assistance Sitting-balance support: Feet supported;No upper extremity supported Sitting balance-Leahy Scale: Fair     Standing balance support: No upper extremity supported;During functional activity Standing balance-Leahy Scale: Poor Standing balance comment: Pt was able to stand EOB while new brief was donned. Required UE support to maintain balance.                              Pertinent Vitals/Pain Pain Assessment: No/denies pain    Home Living Family/patient expects to be discharged to:: Private residence Living Arrangements: Spouse/significant other Available Help at Discharge: Family;Available 24 hours/day Type of Home: House Home Access: Stairs to enter Entrance Stairs-Rails: Right;Left;Can reach both Entrance Stairs-Number of Steps: 3 Home Layout: One level Home Equipment: Shower seat - built in;Wheelchair - manual;Walker - 2 wheels;Cane - single point      Prior Function Level of Independence: Independent with assistive device(s)         Comments: Occasionally uses a cane     Hand Dominance   Dominant Hand: Right    Extremity/Trunk Assessment   Upper Extremity Assessment: LUE deficits/detail  LUE Deficits / Details: Noted some weakness and decreased AROM compared to the right side in shoulder flexion, biceps. States sensation is equal to the R.    Lower Extremity Assessment: LLE deficits/detail   LLE Deficits / Details: Noted some very minimal strength deficits during MMT in quads  and hams. Pt reports equal sensation R and L. Coordination appears to be altered.  Cervical / Trunk Assessment: Kyphotic  Communication   Communication: No difficulties  Cognition Arousal/Alertness: Awake/alert Behavior During Therapy: WFL for tasks assessed/performed Overall Cognitive Status: Within Functional Limits for tasks assessed                      General Comments      Exercises        Assessment/Plan    PT Assessment Patient needs continued PT services  PT Diagnosis Difficulty walking;Generalized weakness   PT Problem List Decreased strength;Decreased range of motion;Decreased activity tolerance;Decreased balance;Decreased mobility;Decreased knowledge of use of DME;Decreased safety awareness;Decreased knowledge of precautions;Decreased coordination  PT Treatment Interventions DME instruction;Gait training;Stair training;Functional mobility training;Therapeutic activities;Therapeutic exercise;Neuromuscular re-education;Patient/family education   PT Goals (Current goals can be found in the Care Plan section) Acute Rehab PT Goals Patient Stated Goal: Return home today PT Goal Formulation: With patient Time For Goal Achievement: 08/09/15 Potential to Achieve Goals: Good    Frequency Min 4X/week   Barriers to discharge        Co-evaluation               End of Session Equipment Utilized During Treatment: Gait belt Activity Tolerance: Patient tolerated treatment well Patient left: in chair;with call bell/phone within reach;with chair alarm set Nurse Communication: Mobility status         Time: 0757-0823 PT Time Calculation (min) (ACUTE ONLY): 26 min   Charges:   PT Evaluation $Initial PT Evaluation Tier I: 1 Procedure PT Treatments $Gait Training: 8-22 mins   PT G Codes:        Rolinda Roan 08/02/2015, 9:34 AM   Rolinda Roan, PT, DPT Acute Rehabilitation Services Pager: [phone]

## 2015-07-26 NOTE — Care Management Note (Signed)
Case Management Note  Patient Details  Name: Kristopher Zimmerman MRN: [id number] Date of Birth: 02/09/1924  Subjective/Objective:  Patient admitted with CVA. Patient is from home with his spouse.                 Action/Plan: PT recommending HHPT. Awaiting OT recommendation. CM will continue to follow for discharge needs.    Expected Discharge Date:                  Expected Discharge Plan:     In-House Referral:     Discharge planning Services     Post Acute Care Choice:    Choice offered to:     DME Arranged:    DME Agency:     HH Arranged:    HH Agency:     Status of Service:  In process, will continue to follow  Medicare Important Message Given:    Date Medicare IM Given:    Medicare IM give by:    Date Additional Medicare IM Given:    Additional Medicare Important Message give by:     If discussed at [place] of Stay Meetings, dates discussed:    Additional Comments:  Pollie Friar, RN 07/26/2015, 9:55 AM

## 2015-07-26 NOTE — Progress Notes (Deleted)
Patient Demographics  Kristopher Zimmerman, is a 81 y.o. male, DOB - [birth year], [id number]  Admit date - 07/25/2015   Admitting Physician Reyne Dumas, MD  Outpatient Primary MD for the patient is Merrilee Seashore, MD  LOS - 1   Chief Complaint  Patient presents with  . Code Stroke         Subjective:   Kristopher Zimmerman today has, No headache, No chest pain, No abdominal pain - No Nausea, No Cough - SOB.   Assessment & Plan    Principal Problem:   Acute CVA (cerebrovascular accident) [facility]) Active Problems:   Aortic stenosis   Diabetes mellitus (HCC)   Chronic kidney disease (CKD) stage G2/A1, mildly decreased glomerular filtration rate (GFR) between 60-89 mL/min/1.73 square meter and albuminuria creatinine ratio less than 30 mg/g   Diabetic neuropathy (HCC)  Acute right pons CVA - Left-sided weakness significantly improved. - MRI brain with acute infarct of right pons - MRA head and neck with no acute finding - 2-D echo pending - LDL 42 - Hemoglobin A1c pending - Patient is on aspirin 325 mg at home, continued on admission.  Hypertension - Mildly elevated, continue to hold  Irbasartan , and tinea with metoprolol  Hyperlipidemia - LDL is 42, continue with Pravachol 40 mg daily  Diabetes mellitus - Hold Metformin, Continue with insulin sliding scale - Follow on hemoglobin A1c  CKD stage III -Baseline 1.3-1.5, is 1.7 on admission, will hold irbesartan - recheck BMP  Code status: Full  Family Communication: none at bedside  Disposition Plan: pending PT   Procedures  None   Consults   Neurology   Medications  Scheduled Meds: . aspirin  325 mg Oral Daily  . calcium carbonate  625 mg Oral Q breakfast  . DULoxetine  30 mg Oral Daily  . enoxaparin (LOVENOX) injection  30 mg Subcutaneous Q24H  . gabapentin  100 mg Oral BID  . insulin aspart  0-9 Units Subcutaneous  TID WC  . irbesartan  150 mg Oral Daily  . LORazepam  2 mg Oral QHS  . magnesium oxide  400 mg Oral Daily  . metoprolol tartrate  12.5 mg Oral BID  . polyvinyl alcohol  1 drop Both Eyes QID  . pramipexole  0.5 mg Oral QHS  . pravastatin  40 mg Oral Daily  . senna-docusate  1 tablet Oral BID  . tamsulosin  0.4 mg Oral QPC supper  . vitamin C  500 mg Oral Daily   Continuous Infusions:  PRN Meds:.  DVT Prophylaxis  Lovenox  Lab Results  Component Value Date   PLT 195 07/25/2015    Antibiotics    Anti-infectives    None          Objective:   Filed Vitals:   07/26/15 0200 07/26/15 0400 07/26/15 0600 07/26/15 0800  BP: 173/80 162/76 169/79 145/74  Pulse: 61 64 64 82  Temp: 98 F (36.7 C) 98 F (36.7 C) 97.5 F (36.4 C) 97.7 F (36.5 C)  TempSrc: Oral Oral Oral Oral  Resp: 18 18 18 20   Height:      Weight:      SpO2: 95% 95% 95% 99%    Wt Readings from Last 3 Encounters:  07/25/15 95  kg (209 lb 7 oz)  03/02/15 93.441 kg (206 lb)  01/31/15 92.987 kg (205 lb)     Intake/Output Summary (Last 24 hours) at 07/26/15 1137 Last data filed at 07/26/15 0414  Gross per 24 hour  Intake      3 ml  Output    400 ml  Net   -397 ml     Physical Exam  Awake Alert, Oriented X 3, No new F.N deficits, Normal affect [place].AT,PERRAL Supple Neck,No JVD, No cervical lymphadenopathy appriciated.  Symmetrical Chest wall movement, Good air movement bilaterally, CTAB RRR,No Gallops,Rubs or new Murmurs, No Parasternal Heave +ve B.Sounds, Abd Soft, No tenderness, No organomegaly appriciated, No rebound - guarding or rigidity. No Cyanosis, Clubbing or edema, No new Rash or bruise     Data Review   Micro Results No results found for this or any previous visit (from the past 240 hour(s)).  Radiology Reports Dg Chest 2 View  07/25/2015  CLINICAL DATA:  Stroke.  Left-sided weakness EXAM: CHEST  2 VIEW COMPARISON:  11/28/2014 FINDINGS: The heart size and mediastinal contours  are within normal limits. Both lungs are clear. The visualized skeletal structures are unremarkable. IMPRESSION: No active cardiopulmonary disease. Electronically Signed   By: Franchot Gallo M.D.   On: 07/25/2015 19:16   Ct Head Wo Contrast  07/25/2015  CLINICAL DATA:  Slurred speech.  Code stroke. EXAM: CT HEAD WITHOUT CONTRAST TECHNIQUE: Contiguous axial images were obtained from the base of the skull through the vertex without intravenous contrast. COMPARISON:  Head CT and brain MRI 11/28/2014. FINDINGS: There is no evidence of acute intracranial hemorrhage, mass lesion, brain edema or extra-axial fluid collection. The ventricles and subarachnoid spaces are appropriately sized for age. There is no CT evidence of acute cortical infarction. There are chronic small vessel ischemic changes in the periventricular white matter bilaterally which appear unchanged. Evolution of previously demonstrated small lacunar infarct in the left corona radiata. The visualized paranasal sinuses, mastoid air cells and middle ears are clear. The calvarium is intact. IMPRESSION: Expected evolution of previously demonstrated small lacunar infarct in the left corona radiata. No evidence of acute stroke or hemorrhage. These results were called by telephone at the time of interpretation on 07/25/2015 at 4:16 pm to Dr. Cristobal Goldmann, who verbally acknowledged these results. Electronically Signed   By: Richardean Sale M.D.   On: 07/25/2015 16:19   Mr Jodene Nam Head Wo Contrast  07/25/2015  CLINICAL DATA:  Left-sided weakness.  Slurred speech. EXAM: MRI HEAD WITHOUT CONTRAST MRA HEAD WITHOUT CONTRAST MRA NECK WITHOUT CONTRAST TECHNIQUE: Multiplanar, multiecho pulse sequences of the brain and surrounding structures were obtained without intravenous contrast. Angiographic images of the Circle of Willis were obtained using MRA technique without intravenous contrast. Angiographic images of the neck were obtained using MRA technique without  intravenous contrast. Carotid stenosis measurements (when applicable) are obtained utilizing NASCET criteria, using the distal internal carotid diameter as the denominator. COMPARISON:  MRI head 11/28/2014 FINDINGS: MRI HEAD FINDINGS Acute infarct in the right pons. This measures approximately 8 x 15 mm. No other acute infarct on today's study. Moderate atrophy. Moderate chronic microvascular ischemic change in the white matter. Negative for intracranial hemorrhage.  No fluid collection. Negative for mass or edema.  No shift of the midline structures. Mild mucosal edema in the paranasal sinuses. MRA HEAD FINDINGS Both vertebral arteries patent to the basilar without significant stenosis. Left PICA patent. Right PICA not visualized. Atherosclerotic irregularity in the basilar without significant stenosis. Fetal origin  of the posterior cerebral artery bilaterally with hypoplastic distal basilar. Both superior cerebellar and posterior cerebral arteries are patent without significant stenosis. Internal carotid artery widely patent bilaterally. Anterior and middle cerebral arteries patent without significant stenosis bilaterally Negative for cerebral aneurysm. MRA NECK FINDINGS Suboptimal image quality due to lack of intravenous contrast and patient motion. Carotid bifurcation appears widely patent bilaterally without stenosis. Both vertebral arteries are patent. Left vertebral artery is dominant. IMPRESSION: Acute infarct right pons. Atrophy and chronic microvascular ischemia Negative MRA of the head and neck. Electronically Signed   By: Franchot Gallo M.D.   On: 07/25/2015 19:04   Mr Angiogram Neck Wo Contrast  07/25/2015  CLINICAL DATA:  Left-sided weakness.  Slurred speech. EXAM: MRI HEAD WITHOUT CONTRAST MRA HEAD WITHOUT CONTRAST MRA NECK WITHOUT CONTRAST TECHNIQUE: Multiplanar, multiecho pulse sequences of the brain and surrounding structures were obtained without intravenous contrast. Angiographic images of  the Circle of Willis were obtained using MRA technique without intravenous contrast. Angiographic images of the neck were obtained using MRA technique without intravenous contrast. Carotid stenosis measurements (when applicable) are obtained utilizing NASCET criteria, using the distal internal carotid diameter as the denominator. COMPARISON:  MRI head 11/28/2014 FINDINGS: MRI HEAD FINDINGS Acute infarct in the right pons. This measures approximately 8 x 15 mm. No other acute infarct on today's study. Moderate atrophy. Moderate chronic microvascular ischemic change in the white matter. Negative for intracranial hemorrhage.  No fluid collection. Negative for mass or edema.  No shift of the midline structures. Mild mucosal edema in the paranasal sinuses. MRA HEAD FINDINGS Both vertebral arteries patent to the basilar without significant stenosis. Left PICA patent. Right PICA not visualized. Atherosclerotic irregularity in the basilar without significant stenosis. Fetal origin of the posterior cerebral artery bilaterally with hypoplastic distal basilar. Both superior cerebellar and posterior cerebral arteries are patent without significant stenosis. Internal carotid artery widely patent bilaterally. Anterior and middle cerebral arteries patent without significant stenosis bilaterally Negative for cerebral aneurysm. MRA NECK FINDINGS Suboptimal image quality due to lack of intravenous contrast and patient motion. Carotid bifurcation appears widely patent bilaterally without stenosis. Both vertebral arteries are patent. Left vertebral artery is dominant. IMPRESSION: Acute infarct right pons. Atrophy and chronic microvascular ischemia Negative MRA of the head and neck. Electronically Signed   By: Franchot Gallo M.D.   On: 07/25/2015 19:04   Mr Brain Wo Contrast  07/25/2015  CLINICAL DATA:  Left-sided weakness.  Slurred speech. EXAM: MRI HEAD WITHOUT CONTRAST MRA HEAD WITHOUT CONTRAST MRA NECK WITHOUT CONTRAST  TECHNIQUE: Multiplanar, multiecho pulse sequences of the brain and surrounding structures were obtained without intravenous contrast. Angiographic images of the Circle of Willis were obtained using MRA technique without intravenous contrast. Angiographic images of the neck were obtained using MRA technique without intravenous contrast. Carotid stenosis measurements (when applicable) are obtained utilizing NASCET criteria, using the distal internal carotid diameter as the denominator. COMPARISON:  MRI head 11/28/2014 FINDINGS: MRI HEAD FINDINGS Acute infarct in the right pons. This measures approximately 8 x 15 mm. No other acute infarct on today's study. Moderate atrophy. Moderate chronic microvascular ischemic change in the white matter. Negative for intracranial hemorrhage.  No fluid collection. Negative for mass or edema.  No shift of the midline structures. Mild mucosal edema in the paranasal sinuses. MRA HEAD FINDINGS Both vertebral arteries patent to the basilar without significant stenosis. Left PICA patent. Right PICA not visualized. Atherosclerotic irregularity in the basilar without significant stenosis. Fetal origin of the posterior cerebral artery  bilaterally with hypoplastic distal basilar. Both superior cerebellar and posterior cerebral arteries are patent without significant stenosis. Internal carotid artery widely patent bilaterally. Anterior and middle cerebral arteries patent without significant stenosis bilaterally Negative for cerebral aneurysm. MRA NECK FINDINGS Suboptimal image quality due to lack of intravenous contrast and patient motion. Carotid bifurcation appears widely patent bilaterally without stenosis. Both vertebral arteries are patent. Left vertebral artery is dominant. IMPRESSION: Acute infarct right pons. Atrophy and chronic microvascular ischemia Negative MRA of the head and neck. Electronically Signed   By: Franchot Gallo M.D.   On: 07/25/2015 19:04     CBC  Recent Labs Lab  07/25/15 1606 07/25/15 1611  WBC 6.7  --   HGB 10.5* 11.6*  HCT 32.0* 34.0*  PLT 195  --   MCV 93.8  --   MCH 30.8  --   MCHC 32.8  --   RDW 13.0  --   LYMPHSABS 1.5  --   MONOABS 0.6  --   EOSABS 0.2  --   BASOSABS 0.0  --     Chemistries   Recent Labs Lab 07/25/15 1606 07/25/15 1611  NA 133* 133*  K 4.9 4.7  CL 97* 96*  CO2 22  --   GLUCOSE 180* 173*  BUN 41* 40*  CREATININE 1.75* 1.70*  CALCIUM 9.7  --   AST 25  --   ALT 18  --   ALKPHOS 78  --   BILITOT 0.5  --    ------------------------------------------------------------------------------------------------------------------ estimated creatinine clearance is 33.5 mL/min (by C-G formula based on Cr of 1.7). ------------------------------------------------------------------------------------------------------------------ No results for input(s): HGBA1C in the last 72 hours. ------------------------------------------------------------------------------------------------------------------  Recent Labs  07/26/15 0620  CHOL 103  HDL 36*  LDLCALC 42  TRIG 124  CHOLHDL 2.9   ------------------------------------------------------------------------------------------------------------------ No results for input(s): TSH, T4TOTAL, T3FREE, THYROIDAB in the last 72 hours.  Invalid input(s): FREET3 ------------------------------------------------------------------------------------------------------------------ No results for input(s): VITAMINB12, FOLATE, FERRITIN, TIBC, IRON, RETICCTPCT in the last 72 hours.  Coagulation profile  Recent Labs Lab 07/25/15 1606  INR 1.02    No results for input(s): DDIMER in the last 72 hours.  Cardiac Enzymes  Recent Labs Lab 07/25/15 2007 07/25/15 2304 07/26/15 0620  TROPONINI 0.03 0.03 <0.03   ------------------------------------------------------------------------------------------------------------------ Invalid input(s): POCBNP     Time Spent in minutes    30 minutes   Brizeida Mcmurry M.D on 07/26/2015 at 11:37 AM  Between 7am to 7pm - Pager - [phone]  After 7pm go to [email or web address] - password [email or web address]  [facility]   Office  [phone]

## 2015-07-26 NOTE — Progress Notes (Signed)
Pt for discharge home today. Discharge orders received. IV and telemetry dcd with dressing clean dry and intact. Discharge instructions and prescriptions given with verbalized understanding. Family at bedside to assist with discharge. Staff brought patient to lobby via wheelchair at 1715  Transported to home by family member.

## 2015-07-26 NOTE — Discharge Summary (Signed)
Kristopher Zimmerman, is a 81 y.o. male  DOB 22-Jul-1924  MRN [id number].  Admission date:  07/25/2015  Admitting Physician  Reyne Dumas, MD  Discharge Date:  07/26/2015   Primary MD  Merrilee Seashore, MD  Recommendations for primary care physician for things to follow:  -  to follow with neurology as an outpatient, ambulatory referral to neurology has been requested - Aspirin was changed to Plavix given he had a stroke while on full dose aspirin.   Admission Diagnosis  Acute on chronic renal insufficiency (HCC) [N28.9, N18.9] Acute CVA (cerebrovascular accident) ([place]) [I63.9]   Discharge Diagnosis  Acute on chronic renal insufficiency (HCC) [N28.9, N18.9] Acute CVA (cerebrovascular accident) ([place]) [I63.9]   Principal Problem:   Acute CVA (cerebrovascular accident) [facility] ([place] Active Problems:   Aortic stenosis   Diabetes mellitus (HCC)   Chronic kidney disease (CKD) stage G2/A1, mildly decreased glomerular filtration rate (GFR) between 60-89 mL/min/1.73 square meter and albuminuria creatinine ratio less than 30 mg/g   Diabetic neuropathy [facility])      Past Medical History  Diagnosis Date  . Diabetes mellitus without complication ([place])   . Neuropathy, diabetic ([place])   . Hypertension   . Renal insufficiency   . Stroke ([place])   . Macular degeneration of right eye     Past Surgical History  Procedure Laterality Date  . Back surgery    . Broken leg    . Cervical disc surgery    . Tonsillectomy    . Appendectomy         History of present illness and  Hospital Course:     Kindly see H&P for history of present illness and admission details, please review complete Labs, Consult reports and Test reports for all details in brief  HPI  from the history and physical done on the day of admission 07/25/2015 81 year old male was brought in by EMS as a code stroke. He was last seen normal at 9 AM.  Apparently, over the course the day, he has been getting progressively weaker and having some slurred speech.arrived via EMS at 1602 Stat head CT was negative for acute stroke, MRI pending. NIHSS 5 mild facial droop, left arm drift 1 left leg 2 mild dysarthria He is having difficult moving his left side. He did have a stroke in May which gave him some right-sided weakness but had good resolution of symptoms. Denies headache, fever, chills. Denies chest pain, heaviness, tightness, pressure. Patient was seen by neurology , found to be a outside tpa window. Modified Rankin: Rankin Score=0.  CT head shows Expected evolution of previously demonstrated small lacunar infarct in the left corona radiata. No evidence of acute stroke or hemorrhage   Hospital Course   Acute right pons CVA secondary to small vessel disease source - Left-sided weakness significantly improved, has left-sided ataxia. - MRI brain with acute infarct of right pons - MRA head and neck with no acute finding - 2-D echo EF 55%, no regional wall motion abnormality, no evidence of cardiac embolic source identified -  LDL 42 - Hemoglobin A1c pending at time of discharge - aspirin 325 mg daily prior to admission. Given stroke on aspirin, will discontinue and start on plavix for secondary stroke prevention on discharge. - Home PT to be arranged by case management prior to discharge  Hypertension - Resume home Medication on discharge  Hyperlipidemia - LDL is 42, continue with Pravachol 40 mg daily  Diabetes mellitus - Resume home medication or discharge  CKD stage III -Baseline 1.3-1.5, is 1.7 on admission, creatinine 1.35 at day of discharge    Discharge Condition:  Stable   Follow UP  Follow-up Information    Follow up with SETHI,PRAMOD, MD In 2 months.   Specialties:  Neurology, Radiology   Why:  Stroke Clinic, Office will call you with appointment date & time   Contact information:   [place] [address] [phone]       Follow up with [facility], MD. Schedule an appointment as soon as possible for a visit in 1 week.   Specialty:  Internal Medicine   Why:  post hospitalization follow up.   Contact information:   [place] [address] [phone]         Discharge Instructions  and  Discharge Medications     Discharge Instructions    Ambulatory referral to Neurology    Complete by:  As directed   Please schedule post stroke follow up in 2 months.     Diet - low sodium heart healthy    Complete by:  As directed      Discharge instructions    Complete by:  As directed   Follow with Primary MD Merrilee Seashore, MD in 7 days   Get CBC, CMP,checked by PCP during next visit.   Activity: As tolerated with Full fall precautions use walker/cane & assistance as needed   Disposition Home    Diet: Heart Healthy  , with feeding assistance and aspiration precautions.  For Heart failure patients - Check your Weight same time everyday, if you gain over 2 pounds, or you develop in leg swelling, experience more shortness of breath or chest pain, call your Primary MD immediately. Follow Cardiac Low Salt Diet and 1.5 lit/day fluid restriction.   On your next visit with your primary care physician please Get Medicines reviewed and adjusted.   Please request your Prim.MD to go over all Hospital Tests and Procedure/Radiological results at the follow up, please get all Hospital records sent to your Prim MD by signing hospital release before you go home.   If you experience worsening of your admission symptoms, develop shortness of breath, life threatening emergency, suicidal or homicidal thoughts you must seek medical attention immediately by calling 911 or calling your MD immediately  if symptoms less severe.  You Must read complete instructions/literature along with all the possible adverse reactions/side effects for all  the Medicines you take and that have been prescribed to you. Take any new Medicines after you have completely understood and accpet all the possible adverse reactions/side effects.   Do not drive, operating heavy machinery, perform activities at heights, swimming or participation in water activities or provide baby sitting services if your were admitted for syncope or siezures until you have seen by Primary MD or a Neurologist and advised to do so again.  Do not drive when taking Pain medications.    Do not take more than prescribed Pain, Sleep and Anxiety Medications  Special Instructions: If you  have smoked or chewed Tobacco  in the last 2 yrs please stop smoking, stop any regular Alcohol  and or any Recreational drug use.  Wear Seat belts while driving.   Please note  You were cared for by a hospitalist during your hospital stay. If you have any questions about your discharge medications or the care you received while you were in the hospital after you are discharged, you can call the unit and asked to speak with the hospitalist on call if the hospitalist that took care of you is not available. Once you are discharged, your primary care physician will handle any further medical issues. Please note that NO REFILLS for any discharge medications will be authorized once you are discharged, as it is imperative that you return to your primary care physician (or establish a relationship with a primary care physician if you do not have one) for your aftercare needs so that they can reassess your need for medications and monitor your lab values.     Increase activity slowly    Complete by:  As directed             Medication List    STOP taking these medications        aspirin 325 MG tablet      TAKE these medications        B-complex with vitamin C tablet  Take 1 tablet by mouth daily.     calcium carbonate 600 MG Tabs tablet  Commonly known as:  OS-CAL  Take 600 mg by mouth daily  with breakfast.     clopidogrel 75 MG tablet  Commonly known as:  PLAVIX  Take 1 tablet (75 mg total) by mouth daily.     cyclobenzaprine 10 MG tablet  Commonly known as:  FLEXERIL  Take 10 mg by mouth at bedtime.     doxycycline 100 MG capsule  Commonly known as:  VIBRAMYCIN  Take 100 mg by mouth daily.     DULoxetine 30 MG capsule  Commonly known as:  CYMBALTA  Take 30 mg by mouth daily. Take with a 60 to make 90     DULoxetine 60 MG capsule  Commonly known as:  CYMBALTA  Take 60 mg by mouth daily. Take with 30 to make 90     gabapentin 100 MG capsule  Commonly known as:  NEURONTIN  Take 100 mg by mouth 2 (two) times daily.     glyBURIDE-metformin 2.5-500 MG tablet  Commonly known as:  GLUCOVANCE  Take 2 tablets by mouth daily with breakfast. 2 tabs at breakfast and 1 at Supper     hydrochlorothiazide 25 MG tablet  Commonly known as:  HYDRODIURIL  Take 25 mg by mouth daily.     irbesartan 150 MG tablet  Commonly known as:  AVAPRO  Take 150 mg by mouth daily.     LORazepam 2 MG tablet  Commonly known as:  ATIVAN  Take 2 mg by mouth at bedtime.     Magnesium Citrate 100 MG Tabs  Take 1 tablet by mouth daily.     meloxicam 15 MG tablet  Commonly known as:  MOBIC  Take 15 mg by mouth at bedtime.     metoprolol tartrate 25 MG tablet  Commonly known as:  LOPRESSOR  Take 0.5 tablets (12.5 mg total) by mouth 2 (two) times daily.     pramipexole 0.5 MG tablet  Commonly known as:  MIRAPEX  Take 0.5 mg by mouth at bedtime.  pravastatin 40 MG tablet  Commonly known as:  PRAVACHOL  Take 40 mg by mouth daily.     REFRESH LIQUIGEL OP  Apply 1 drop to eye 4 (four) times daily.     SYSTANE BALANCE 0.6 % Soln  Generic drug:  Propylene Glycol  Apply 1 drop to eye 4 (four) times daily.     tamsulosin 0.4 MG Caps capsule  Commonly known as:  FLOMAX  Take by mouth.     vitamin C 500 MG tablet  Commonly known as:  ASCORBIC ACID  Take 500 mg by mouth daily.           Diet and Activity recommendation: See Discharge Instructions above   Consults obtained -  neurology   Major procedures and Radiology Reports - PLEASE review detailed and final reports for all details, in brief -      Dg Chest 2 View  07/25/2015  CLINICAL DATA:  Stroke.  Left-sided weakness EXAM: CHEST  2 VIEW COMPARISON:  11/28/2014 FINDINGS: The heart size and mediastinal contours are within normal limits. Both lungs are clear. The visualized skeletal structures are unremarkable. IMPRESSION: No active cardiopulmonary disease. Electronically Signed   By: Franchot Gallo M.D.   On: 07/25/2015 19:16   Ct Head Wo Contrast  07/25/2015  CLINICAL DATA:  Slurred speech.  Code stroke. EXAM: CT HEAD WITHOUT CONTRAST TECHNIQUE: Contiguous axial images were obtained from the base of the skull through the vertex without intravenous contrast. COMPARISON:  Head CT and brain MRI 11/28/2014. FINDINGS: There is no evidence of acute intracranial hemorrhage, mass lesion, brain edema or extra-axial fluid collection. The ventricles and subarachnoid spaces are appropriately sized for age. There is no CT evidence of acute cortical infarction. There are chronic small vessel ischemic changes in the periventricular white matter bilaterally which appear unchanged. Evolution of previously demonstrated small lacunar infarct in the left corona radiata. The visualized paranasal sinuses, mastoid air cells and middle ears are clear. The calvarium is intact. IMPRESSION: Expected evolution of previously demonstrated small lacunar infarct in the left corona radiata. No evidence of acute stroke or hemorrhage. These results were called by telephone at the time of interpretation on 07/25/2015 at 4:16 pm to Dr. Cristobal Goldmann, who verbally acknowledged these results. Electronically Signed   By: Richardean Sale M.D.   On: 07/25/2015 16:19   Mr Jodene Nam Head Wo Contrast  07/25/2015  CLINICAL DATA:  Left-sided weakness.  Slurred  speech. EXAM: MRI HEAD WITHOUT CONTRAST MRA HEAD WITHOUT CONTRAST MRA NECK WITHOUT CONTRAST TECHNIQUE: Multiplanar, multiecho pulse sequences of the brain and surrounding structures were obtained without intravenous contrast. Angiographic images of the Circle of Willis were obtained using MRA technique without intravenous contrast. Angiographic images of the neck were obtained using MRA technique without intravenous contrast. Carotid stenosis measurements (when applicable) are obtained utilizing NASCET criteria, using the distal internal carotid diameter as the denominator. COMPARISON:  MRI head 11/28/2014 FINDINGS: MRI HEAD FINDINGS Acute infarct in the right pons. This measures approximately 8 x 15 mm. No other acute infarct on today's study. Moderate atrophy. Moderate chronic microvascular ischemic change in the white matter. Negative for intracranial hemorrhage.  No fluid collection. Negative for mass or edema.  No shift of the midline structures. Mild mucosal edema in the paranasal sinuses. MRA HEAD FINDINGS Both vertebral arteries patent to the basilar without significant stenosis. Left PICA patent. Right PICA not visualized. Atherosclerotic irregularity in the basilar without significant stenosis. Fetal origin of the posterior cerebral artery bilaterally with hypoplastic  distal basilar. Both superior cerebellar and posterior cerebral arteries are patent without significant stenosis. Internal carotid artery widely patent bilaterally. Anterior and middle cerebral arteries patent without significant stenosis bilaterally Negative for cerebral aneurysm. MRA NECK FINDINGS Suboptimal image quality due to lack of intravenous contrast and patient motion. Carotid bifurcation appears widely patent bilaterally without stenosis. Both vertebral arteries are patent. Left vertebral artery is dominant. IMPRESSION: Acute infarct right pons. Atrophy and chronic microvascular ischemia Negative MRA of the head and neck.  Electronically Signed   By: Franchot Gallo M.D.   On: 07/25/2015 19:04   Mr Angiogram Neck Wo Contrast  07/25/2015  CLINICAL DATA:  Left-sided weakness.  Slurred speech. EXAM: MRI HEAD WITHOUT CONTRAST MRA HEAD WITHOUT CONTRAST MRA NECK WITHOUT CONTRAST TECHNIQUE: Multiplanar, multiecho pulse sequences of the brain and surrounding structures were obtained without intravenous contrast. Angiographic images of the Circle of Willis were obtained using MRA technique without intravenous contrast. Angiographic images of the neck were obtained using MRA technique without intravenous contrast. Carotid stenosis measurements (when applicable) are obtained utilizing NASCET criteria, using the distal internal carotid diameter as the denominator. COMPARISON:  MRI head 11/28/2014 FINDINGS: MRI HEAD FINDINGS Acute infarct in the right pons. This measures approximately 8 x 15 mm. No other acute infarct on today's study. Moderate atrophy. Moderate chronic microvascular ischemic change in the white matter. Negative for intracranial hemorrhage.  No fluid collection. Negative for mass or edema.  No shift of the midline structures. Mild mucosal edema in the paranasal sinuses. MRA HEAD FINDINGS Both vertebral arteries patent to the basilar without significant stenosis. Left PICA patent. Right PICA not visualized. Atherosclerotic irregularity in the basilar without significant stenosis. Fetal origin of the posterior cerebral artery bilaterally with hypoplastic distal basilar. Both superior cerebellar and posterior cerebral arteries are patent without significant stenosis. Internal carotid artery widely patent bilaterally. Anterior and middle cerebral arteries patent without significant stenosis bilaterally Negative for cerebral aneurysm. MRA NECK FINDINGS Suboptimal image quality due to lack of intravenous contrast and patient motion. Carotid bifurcation appears widely patent bilaterally without stenosis. Both vertebral arteries are  patent. Left vertebral artery is dominant. IMPRESSION: Acute infarct right pons. Atrophy and chronic microvascular ischemia Negative MRA of the head and neck. Electronically Signed   By: Franchot Gallo M.D.   On: 07/25/2015 19:04   Mr Brain Wo Contrast  07/25/2015  CLINICAL DATA:  Left-sided weakness.  Slurred speech. EXAM: MRI HEAD WITHOUT CONTRAST MRA HEAD WITHOUT CONTRAST MRA NECK WITHOUT CONTRAST TECHNIQUE: Multiplanar, multiecho pulse sequences of the brain and surrounding structures were obtained without intravenous contrast. Angiographic images of the Circle of Willis were obtained using MRA technique without intravenous contrast. Angiographic images of the neck were obtained using MRA technique without intravenous contrast. Carotid stenosis measurements (when applicable) are obtained utilizing NASCET criteria, using the distal internal carotid diameter as the denominator. COMPARISON:  MRI head 11/28/2014 FINDINGS: MRI HEAD FINDINGS Acute infarct in the right pons. This measures approximately 8 x 15 mm. No other acute infarct on today's study. Moderate atrophy. Moderate chronic microvascular ischemic change in the white matter. Negative for intracranial hemorrhage.  No fluid collection. Negative for mass or edema.  No shift of the midline structures. Mild mucosal edema in the paranasal sinuses. MRA HEAD FINDINGS Both vertebral arteries patent to the basilar without significant stenosis. Left PICA patent. Right PICA not visualized. Atherosclerotic irregularity in the basilar without significant stenosis. Fetal origin of the posterior cerebral artery bilaterally with hypoplastic distal basilar. Both superior cerebellar  and posterior cerebral arteries are patent without significant stenosis. Internal carotid artery widely patent bilaterally. Anterior and middle cerebral arteries patent without significant stenosis bilaterally Negative for cerebral aneurysm. MRA NECK FINDINGS Suboptimal image quality due to  lack of intravenous contrast and patient motion. Carotid bifurcation appears widely patent bilaterally without stenosis. Both vertebral arteries are patent. Left vertebral artery is dominant. IMPRESSION: Acute infarct right pons. Atrophy and chronic microvascular ischemia Negative MRA of the head and neck. Electronically Signed   By: Franchot Gallo M.D.   On: 07/25/2015 19:04    Micro Results     No results found for this or any previous visit (from the past 240 hour(s)).     Today   Subjective:   Kristopher Zimmerman today has no headache,no chest abdominal pain, reports left-sided weakness significantly resolved, feels much better wants to go home today.   Objective:   Blood pressure 151/72, pulse 67, temperature 97.9 F (36.6 C), temperature source Oral, resp. rate 18, height 5\' 9"  (1.753 m), weight 95 kg (209 lb 7 oz), SpO2 95 %.   Intake/Output Summary (Last 24 hours) at 07/26/15 1611 Last data filed at 07/26/15 0414  Gross per 24 hour  Intake      3 ml  Output    400 ml  Net   -397 ml    Exam Awake Alert, Oriented x 3, Normal affect [place].AT,PERRAL Supple Neck,No JVD, No cervical lymphadenopathy appriciated.  Symmetrical Chest wall movement, Good air movement bilaterally, CTAB RRR,No Gallops,Rubs or new Murmurs, No Parasternal Heave +ve B.Sounds, Abd Soft, Non tender, No organomegaly appriciated, No rebound -guarding or rigidity. No Cyanosis, Clubbing or edema, mild left-sided weakness, ataxia +.  Data Review   CBC w Diff: Lab Results  Component Value Date   WBC 6.7 07/25/2015   HGB 11.6* 07/25/2015   HCT 34.0* 07/25/2015   PLT 195 07/25/2015   LYMPHOPCT 22 07/25/2015   MONOPCT 9 07/25/2015   EOSPCT 3 07/25/2015   BASOPCT 0 07/25/2015    CMP: Lab Results  Component Value Date   NA 137 07/26/2015   K 4.4 07/26/2015   CL 99* 07/26/2015   CO2 28 07/26/2015   BUN 32* 07/26/2015   CREATININE 1.35* 07/26/2015   PROT 6.3* 07/25/2015   ALBUMIN 3.6 07/25/2015    BILITOT 0.5 07/25/2015   ALKPHOS 78 07/25/2015   AST 25 07/25/2015   ALT 18 07/25/2015  .   Total Time in preparing paper work, data evaluation and todays exam - 35 minutes  Ashritha Desrosiers M.D on 07/26/2015 at [facility]   Office  [phone]

## 2015-07-26 NOTE — Discharge Instructions (Signed)
Follow with Primary MD Merrilee Seashore, MD in 7 days   Get CBC, CMP,checked by PCP during next visit.   Activity: As tolerated with Full fall precautions use walker/cane & assistance as needed   Disposition Home    Diet: Heart Healthy  , with feeding assistance and aspiration precautions.  For Heart failure patients - Check your Weight same time everyday, if you gain over 2 pounds, or you develop in leg swelling, experience more shortness of breath or chest pain, call your Primary MD immediately. Follow Cardiac Low Salt Diet and 1.5 lit/day fluid restriction.   On your next visit with your primary care physician please Get Medicines reviewed and adjusted.   Please request your Prim.MD to go over all Hospital Tests and Procedure/Radiological results at the follow up, please get all Hospital records sent to your Prim MD by signing hospital release before you go home.   If you experience worsening of your admission symptoms, develop shortness of breath, life threatening emergency, suicidal or homicidal thoughts you must seek medical attention immediately by calling 911 or calling your MD immediately  if symptoms less severe.  You Must read complete instructions/literature along with all the possible adverse reactions/side effects for all the Medicines you take and that have been prescribed to you. Take any new Medicines after you have completely understood and accpet all the possible adverse reactions/side effects.   Do not drive, operating heavy machinery, perform activities at heights, swimming or participation in water activities or provide baby sitting services if your were admitted for syncope or siezures until you have seen by Primary MD or a Neurologist and advised to do so again.  Do not drive when taking Pain medications.    Do not take more than prescribed Pain, Sleep and Anxiety Medications  Special Instructions: If you have smoked or chewed Tobacco  in the last 2 yrs  please stop smoking, stop any regular Alcohol  and or any Recreational drug use.  Wear Seat belts while driving.   Please note  You were cared for by a hospitalist during your hospital stay. If you have any questions about your discharge medications or the care you received while you were in the hospital after you are discharged, you can call the unit and asked to speak with the hospitalist on call if the hospitalist that took care of you is not available. Once you are discharged, your primary care physician will handle any further medical issues. Please note that NO REFILLS for any discharge medications will be authorized once you are discharged, as it is imperative that you return to your primary care physician (or establish a relationship with a primary care physician if you do not have one) for your aftercare needs so that they can reassess your need for medications and monitor your lab values.

## 2015-07-27 DIAGNOSIS — E785 Hyperlipidemia, unspecified: Secondary | ICD-10-CM | POA: Insufficient documentation

## 2015-07-27 LAB — HEMOGLOBIN A1C
HEMOGLOBIN A1C: 7.4 % — AB (ref 4.8–5.6)
Hgb A1c MFr Bld: 7.4 % — ABNORMAL HIGH (ref 4.8–5.6)
MEAN PLASMA GLUCOSE: 166 mg/dL
Mean Plasma Glucose: 166 mg/dL

## 2015-07-27 LAB — GLUCOSE, CAPILLARY: Glucose-Capillary: 117 mg/dL — ABNORMAL HIGH (ref 65–99)

## 2015-07-27 NOTE — Progress Notes (Signed)
07/27/15 @ 1034--Patient discharged late yesterday afternoon. CM called and spoke with the patient and his wife and went over the available home health agencies in the [facility] area. Mrs Blankman selected [facility]. Stephanie with [facility] notified and accepted the referral. In speaking with Mrs Brownson she related that Mr Sculley had fallen this am without injury. She states he is weaker today than yesterday. CM asked that she please call his MD and relay this information. Mrs Venier states she has not made follow up appointments for him yet. CM asked that she call his MD to see if could get an appointment to see his MD ASAP. Mrs Czarnecki agreed. CM also informed [facility] of the fall and requested that they try and see him ASAP also. [facility] [place] stated they should be able to have someone out to Mr Smalls's home tomorrow.

## 2015-07-29 ENCOUNTER — Other Ambulatory Visit: Payer: Self-pay

## 2015-07-29 NOTE — Patient Outreach (Signed)
Telephone call placed to obtain verbal consent to enroll in Stroke [facility] Transition. Calls will begin 07/30/15. Patient and spouse have our office number in case they have questions and confirm they will call our office.

## 2015-07-30 DIAGNOSIS — I69322 Dysarthria following cerebral infarction: Secondary | ICD-10-CM | POA: Diagnosis not present

## 2015-07-30 DIAGNOSIS — E785 Hyperlipidemia, unspecified: Secondary | ICD-10-CM | POA: Diagnosis not present

## 2015-07-30 DIAGNOSIS — M1991 Primary osteoarthritis, unspecified site: Secondary | ICD-10-CM | POA: Diagnosis not present

## 2015-07-30 DIAGNOSIS — Z7902 Long term (current) use of antithrombotics/antiplatelets: Secondary | ICD-10-CM | POA: Diagnosis not present

## 2015-07-30 DIAGNOSIS — Z9181 History of falling: Secondary | ICD-10-CM | POA: Diagnosis not present

## 2015-07-30 DIAGNOSIS — I129 Hypertensive chronic kidney disease with stage 1 through stage 4 chronic kidney disease, or unspecified chronic kidney disease: Secondary | ICD-10-CM | POA: Diagnosis not present

## 2015-07-30 DIAGNOSIS — I69354 Hemiplegia and hemiparesis following cerebral infarction affecting left non-dominant side: Secondary | ICD-10-CM | POA: Diagnosis not present

## 2015-07-30 DIAGNOSIS — H3530 Unspecified macular degeneration: Secondary | ICD-10-CM | POA: Diagnosis not present

## 2015-07-30 DIAGNOSIS — Z87891 Personal history of nicotine dependence: Secondary | ICD-10-CM | POA: Diagnosis not present

## 2015-07-30 DIAGNOSIS — E1122 Type 2 diabetes mellitus with diabetic chronic kidney disease: Secondary | ICD-10-CM | POA: Diagnosis not present

## 2015-07-30 DIAGNOSIS — N183 Chronic kidney disease, stage 3 (moderate): Secondary | ICD-10-CM | POA: Diagnosis not present

## 2015-07-30 DIAGNOSIS — E1140 Type 2 diabetes mellitus with diabetic neuropathy, unspecified: Secondary | ICD-10-CM | POA: Diagnosis not present

## 2015-07-30 DIAGNOSIS — I350 Nonrheumatic aortic (valve) stenosis: Secondary | ICD-10-CM | POA: Diagnosis not present

## 2015-07-31 DIAGNOSIS — E1122 Type 2 diabetes mellitus with diabetic chronic kidney disease: Secondary | ICD-10-CM | POA: Diagnosis not present

## 2015-07-31 DIAGNOSIS — I69354 Hemiplegia and hemiparesis following cerebral infarction affecting left non-dominant side: Secondary | ICD-10-CM | POA: Diagnosis not present

## 2015-07-31 DIAGNOSIS — I69322 Dysarthria following cerebral infarction: Secondary | ICD-10-CM | POA: Diagnosis not present

## 2015-07-31 DIAGNOSIS — N183 Chronic kidney disease, stage 3 (moderate): Secondary | ICD-10-CM | POA: Diagnosis not present

## 2015-07-31 DIAGNOSIS — I129 Hypertensive chronic kidney disease with stage 1 through stage 4 chronic kidney disease, or unspecified chronic kidney disease: Secondary | ICD-10-CM | POA: Diagnosis not present

## 2015-07-31 DIAGNOSIS — E1140 Type 2 diabetes mellitus with diabetic neuropathy, unspecified: Secondary | ICD-10-CM | POA: Diagnosis not present

## 2015-08-03 DIAGNOSIS — I69322 Dysarthria following cerebral infarction: Secondary | ICD-10-CM | POA: Diagnosis not present

## 2015-08-03 DIAGNOSIS — I69354 Hemiplegia and hemiparesis following cerebral infarction affecting left non-dominant side: Secondary | ICD-10-CM | POA: Diagnosis not present

## 2015-08-03 DIAGNOSIS — I129 Hypertensive chronic kidney disease with stage 1 through stage 4 chronic kidney disease, or unspecified chronic kidney disease: Secondary | ICD-10-CM | POA: Diagnosis not present

## 2015-08-03 DIAGNOSIS — E1140 Type 2 diabetes mellitus with diabetic neuropathy, unspecified: Secondary | ICD-10-CM | POA: Diagnosis not present

## 2015-08-03 DIAGNOSIS — N183 Chronic kidney disease, stage 3 (moderate): Secondary | ICD-10-CM | POA: Diagnosis not present

## 2015-08-03 DIAGNOSIS — E1122 Type 2 diabetes mellitus with diabetic chronic kidney disease: Secondary | ICD-10-CM | POA: Diagnosis not present

## 2015-08-03 NOTE — Patient Outreach (Signed)
[facility] Management  08/03/2015  Kristopher Zimmerman [birth year] [id number]   Patient triggered RED on EMMI Stroke Dashboard, notification sent to [name], RN.  Thanks, [name]. [name], [place] Assistant Phone: [phone] Fax: [phone]

## 2015-08-04 ENCOUNTER — Other Ambulatory Visit: Payer: Self-pay

## 2015-08-04 ENCOUNTER — Telehealth: Payer: Self-pay | Admitting: Neurology

## 2015-08-04 DIAGNOSIS — N183 Chronic kidney disease, stage 3 (moderate): Secondary | ICD-10-CM | POA: Diagnosis not present

## 2015-08-04 DIAGNOSIS — I69322 Dysarthria following cerebral infarction: Secondary | ICD-10-CM | POA: Diagnosis not present

## 2015-08-04 DIAGNOSIS — E1140 Type 2 diabetes mellitus with diabetic neuropathy, unspecified: Secondary | ICD-10-CM | POA: Diagnosis not present

## 2015-08-04 DIAGNOSIS — I69354 Hemiplegia and hemiparesis following cerebral infarction affecting left non-dominant side: Secondary | ICD-10-CM | POA: Diagnosis not present

## 2015-08-04 DIAGNOSIS — I129 Hypertensive chronic kidney disease with stage 1 through stage 4 chronic kidney disease, or unspecified chronic kidney disease: Secondary | ICD-10-CM | POA: Diagnosis not present

## 2015-08-04 DIAGNOSIS — E1122 Type 2 diabetes mellitus with diabetic chronic kidney disease: Secondary | ICD-10-CM | POA: Diagnosis not present

## 2015-08-04 NOTE — Telephone Encounter (Signed)
Beth/[facility] [phone] called to advise patient's heart rate is a little higher, 104. Wife has been keeping tabs on this as well, when she checked this morning it was 96. BP was 138/60, Oxygen SAT 95%, temp 98.0. Beth states patient has appointment with PCP tomorrow.

## 2015-08-04 NOTE — Telephone Encounter (Signed)
Kristopher Zimmerman also notes that on 08/03/15 heart rate was 63, 08/02/15- 75 heart rate, wife's readings 08/01/15- 78, 08/02/15- 85, 08/03/15- 81.

## 2015-08-04 NOTE — Telephone Encounter (Signed)
LFt vm for Beth([facility]) that patients PCP can evaluate the vital signs.

## 2015-08-04 NOTE — Patient Outreach (Signed)
[facility]) Care Management  08/04/2015  Kendra Krzywicki Sep 26, 1923 [id number]  Telephone call to patient regarding EMMI stroke RED referral. Unable to reach patient. HIPAA compliant voice message left with call back phone number.   PLAN:  RNCM will attempt 2nd telephone outreach to patient within 3 business days.  [name] RN,BSN,CCM [place] Coordinator [phone]

## 2015-08-05 ENCOUNTER — Ambulatory Visit: Payer: Self-pay

## 2015-08-05 ENCOUNTER — Other Ambulatory Visit: Payer: Self-pay

## 2015-08-05 DIAGNOSIS — I63431 Cerebral infarction due to embolism of right posterior cerebral artery: Secondary | ICD-10-CM | POA: Diagnosis not present

## 2015-08-05 DIAGNOSIS — J069 Acute upper respiratory infection, unspecified: Secondary | ICD-10-CM | POA: Diagnosis not present

## 2015-08-05 DIAGNOSIS — N3946 Mixed incontinence: Secondary | ICD-10-CM | POA: Diagnosis not present

## 2015-08-05 NOTE — Patient Outreach (Signed)
[facility]) Care Management  08/05/2015  Kristopher Zimmerman 20-Dec-1923 [id number]  Telephone call to patient regarding EMMI stroke RED referral.  Unable to reach patient. HIPAA compliant voice message left with call back phone number.   PLAN; RNCM will attempt 2nd telephone outreach to patient within 1 week.   [name] RN,BSN,CCM [place] Coordinator [phone]

## 2015-08-06 ENCOUNTER — Other Ambulatory Visit: Payer: Self-pay

## 2015-08-06 NOTE — Patient Outreach (Addendum)
[facility]) Care Management  08/06/2015  Kristopher Zimmerman [birth year] [id number]   SUBJECTIVE: Telephone call to patient regarding EMMI stroke RED referral.  HIPAA verified with patient. Patient gave verbal authorization to speak with his wife, Kristopher Zimmerman regarding any and all of his medical information.  RNCM discussed EMMI stroke transition program with patient.  Patient and spouse in agreement to receive follow up calls with [facility].   Patient states he is moving slow. Patient states he still has to use his walker for ambulation. Patient states he continues to have home therapy with [facility].  Patient and wife states patient is getting stronger since discharged from the hospital.  Patient states he continues to take his medication as prescribed by his doctor.  Patient states he had a follow up visit with his primary MD on yesterday 08/05/15.  Patient states his doctor gave him a new prescribed for Myrbetriq which he started taking on yesterday.  Patient states he also had lab work on yesterday.  Wife states patient has follow up with primary MD in April 2017.   Wife states she is checking patients blood pressure daily.  Wife questioned when to check patients blood pressure during the day due to concern with patients pulse rate versus activity.  Wife confirmed she and patient discussed patients blood pressures and pulse with primary MD.  Wife states no additional recommendations were given.  RNCM explained to patient and wife normal resting heart rate based on the American Heart association guidelines are 60-100 beats a minute.  RNCM recommended checking patients blood pressure/pulse at rest.  Wife verbalized understanding and agreement.  Patient states he checks his blood sugars daily.  Today's blood sugar 150 fasting. Patient states his range has been from low 100's to 160.Wife states patient use to have frequent low's regarding blood pressure but does not have those episodes as much  any more.  Wife and patient states doctor is aware and gave no additional recommendations.  Patient states he tries to adhere to a diabetic diet. .   Wife states patient has had two falls without injury since being discharged from the hospital on 07/26/15. Wife states patient had the fall in the same place, near the bed and wall area.  Wife states the walker was unable to fit between the area of bed and wall and patient would have to take a few steps without the walker.  Wife states upon talking with nurse and physical therapist with [facility] they have since moved the bed out further from the wall and patient is now able to use the walker to get to the bed.  Wife states the falls also occurred when patient first left the hospital and he was still very weak.  Wife states patient has gotten stronger since then.  Wife states patient has follow up appointment scheduled with Dr. Leonie Man, neurologist for 09/01/15. Wife states patient also sees a cardiologist, Dr. Stanford Breed.  Patient and wife agreed to next follow up outreach with RNCM.  RNCM advised patient to continue to take his medications as prescribed, keep scheduled doctor appointments and report any unusual signs/symptoms to his doctor. Advised to continue to adhere to low salt diet.  Reminded patient/wife to take blood pressure/pulse/blood sugar recording to doctors appointment for physician to review. Wife in agreement to receive EMMI education material by email.  ASSESSMENT: EMMI stroke transition program.   PLAN; RNCM will follow up with patient/ wife within 1 week. RNCM will assign patient/wife  EMMI education material regarding stroke/ low sodium diet  [name] [facility] [place] Coordinator [phone]'

## 2015-08-10 DIAGNOSIS — I69354 Hemiplegia and hemiparesis following cerebral infarction affecting left non-dominant side: Secondary | ICD-10-CM | POA: Diagnosis not present

## 2015-08-10 DIAGNOSIS — I129 Hypertensive chronic kidney disease with stage 1 through stage 4 chronic kidney disease, or unspecified chronic kidney disease: Secondary | ICD-10-CM | POA: Diagnosis not present

## 2015-08-10 DIAGNOSIS — E1140 Type 2 diabetes mellitus with diabetic neuropathy, unspecified: Secondary | ICD-10-CM | POA: Diagnosis not present

## 2015-08-10 DIAGNOSIS — E1122 Type 2 diabetes mellitus with diabetic chronic kidney disease: Secondary | ICD-10-CM | POA: Diagnosis not present

## 2015-08-10 DIAGNOSIS — N183 Chronic kidney disease, stage 3 (moderate): Secondary | ICD-10-CM | POA: Diagnosis not present

## 2015-08-10 DIAGNOSIS — I69322 Dysarthria following cerebral infarction: Secondary | ICD-10-CM | POA: Diagnosis not present

## 2015-08-12 ENCOUNTER — Other Ambulatory Visit: Payer: Self-pay

## 2015-08-12 DIAGNOSIS — I129 Hypertensive chronic kidney disease with stage 1 through stage 4 chronic kidney disease, or unspecified chronic kidney disease: Secondary | ICD-10-CM | POA: Diagnosis not present

## 2015-08-12 DIAGNOSIS — I69354 Hemiplegia and hemiparesis following cerebral infarction affecting left non-dominant side: Secondary | ICD-10-CM | POA: Diagnosis not present

## 2015-08-12 DIAGNOSIS — I69322 Dysarthria following cerebral infarction: Secondary | ICD-10-CM | POA: Diagnosis not present

## 2015-08-12 DIAGNOSIS — N183 Chronic kidney disease, stage 3 (moderate): Secondary | ICD-10-CM | POA: Diagnosis not present

## 2015-08-12 DIAGNOSIS — E1140 Type 2 diabetes mellitus with diabetic neuropathy, unspecified: Secondary | ICD-10-CM | POA: Diagnosis not present

## 2015-08-12 DIAGNOSIS — E1122 Type 2 diabetes mellitus with diabetic chronic kidney disease: Secondary | ICD-10-CM | POA: Diagnosis not present

## 2015-08-12 NOTE — Patient Outreach (Signed)
[facility]) Care Management  [place]  08/12/2015   Harfateh Cleto 1923/12/03 [id number]  Subjective:  Telephone call to patient regarding EMMI stroke program follow up. HIPAA verified with patient. Patient gave verbal authorization to speak with his wife about all of his medical information. Patient and wife on phone line together. Patient reports he has had physical therapy today.  States he will be having 3 times a week. Patient states he is tolerating his therapy well.  Patient reports he continues to use his walker for ambulation. Wife states patients blood pressure and pulse have been doing good. Wife reports patients blood pressure was 137/72 today with pulse of 76.  Wife states she now has a pulse ox and thermometer for home use.  Wife reports patients blood sugar today was 95.  Wife states patient has been sick with a cold.  Wife states home health nurse saw patient this week and called doctors office to request antibiotic for patient.  Wife states patient is now on antibiotic, levofloxacin, mucinex, promethazine, and fluticasone.  Wife states patient is doing better.  Patient states he is continuing to progress slowly.  Patient/wife deny patient having any stroke like signs or symptoms. RNCM reviewed with patient/ wife signs and symptoms of stroke.  Advised to call 911 for these symptoms. Wife and patient verbalized understanding. Wife states she has received the [facility] education material via email but has not reviewed yet.  RNCM advised patient and wife to view EMMI material together.      Objective: see assessment  Current Medications:  Current Outpatient Prescriptions  Medication Sig Dispense Refill  . B Complex-C (B-COMPLEX WITH VITAMIN C) tablet Take 1 tablet by mouth daily.    . calcium carbonate (OS-CAL) 600 MG TABS Take 600 mg by mouth daily with breakfast.     . Carboxymethylcellulose Sodium (REFRESH LIQUIGEL OP) Apply 1 drop to eye 4 (four) times daily.     . clopidogrel (PLAVIX) 75 MG tablet Take 1 tablet (75 mg total) by mouth daily. 30 tablet 1  . cyclobenzaprine (FLEXERIL) 10 MG tablet Take 10 mg by mouth at bedtime.    . DULoxetine (CYMBALTA) 30 MG capsule Take 30 mg by mouth daily. Take with a 60 to make 90    . DULoxetine (CYMBALTA) 60 MG capsule Take 60 mg by mouth daily. Take with 30 to make 90    . glyBURIDE-metformin (GLUCOVANCE) 2.5-500 MG per tablet Take 2 tablets by mouth daily with breakfast. 2 tabs at breakfast and 1 at Supper    . hydrochlorothiazide (HYDRODIURIL) 25 MG tablet Take 25 mg by mouth daily.    . irbesartan (AVAPRO) 150 MG tablet Take 150 mg by mouth daily.    Marland Kitchen levofloxacin (LEVAQUIN) 500 MG tablet Take 500 mg by mouth daily. Levofloxacin 500mg  1 x day for 10 days as reported by patients wife.    Marland Kitchen LORazepam (ATIVAN) 2 MG tablet Take 2 mg by mouth at bedtime.    . Magnesium Citrate 100 MG TABS Take 1 tablet by mouth daily.    . meloxicam (MOBIC) 15 MG tablet Take 15 mg by mouth at bedtime.    . metoprolol tartrate (LOPRESSOR) 25 MG tablet Take 0.5 tablets (12.5 mg total) by mouth 2 (two) times daily. 60 tablet 1  . mirabegron ER (MYRBETRIQ) 25 MG TB24 tablet Take 25 mg by mouth daily.    . Multiple Vitamins-Minerals (PRESERVISION AREDS 2 PO) Take by mouth.    . Multiple Vitamins-Minerals (  PRESERVISION AREDS 2) CAPS Take by mouth.    . pramipexole (MIRAPEX) 0.5 MG tablet Take 0.5 mg by mouth at bedtime.     . pravastatin (PRAVACHOL) 40 MG tablet Take 40 mg by mouth daily.    Marland Kitchen Propylene Glycol (SYSTANE BALANCE) 0.6 % SOLN Apply 1 drop to eye 4 (four) times daily.    . pseudoephedrine-guaifenesin (MUCINEX D) 60-600 MG 12 hr tablet Take 1 tablet by mouth every 12 (twelve) hours.    . tamsulosin (FLOMAX) 0.4 MG CAPS Take by mouth.    . vitamin C (ASCORBIC ACID) 500 MG tablet Take 500 mg by mouth daily.    Marland Kitchen doxycycline (VIBRAMYCIN) 100 MG capsule Take 100 mg by mouth daily. Reported on 08/12/2015  0  . gabapentin  (NEURONTIN) 100 MG capsule Take 100 mg by mouth 2 (two) times daily. Reported on 08/12/2015     No current facility-administered medications for this visit.    Functional Status:  In your present state of health, do you have any difficulty performing the following activities: 08/12/2015 07/25/2015  Hearing? N N  Vision? N N  Difficulty concentrating or making decisions? N N  Walking or climbing stairs? Y N  Dressing or bathing? Y N  Doing errands, shopping? N N  Preparing Food and eating ? Y -  Using the Toilet? N -  In the past six months, have you accidently leaked urine? Y -  Do you have problems with loss of bowel control? N -  Managing your Medications? Y -  Managing your Finances? Y -  Housekeeping or managing your Housekeeping? Y -    Fall/Depression Screening: PHQ 2/9 Scores 08/12/2015  PHQ - 2 Score 4  PHQ- 9 Score 5   Fall Risk  08/12/2015 08/06/2015  Falls in the past year? Yes Yes  Number falls in past yr: 2 or more 2 or more  Injury with Fall? No No  Risk Factor Category  High Fall Risk High Fall Risk  Risk for fall due to : History of fall(s) History of fall(s)  Follow up Education provided;Falls prevention discussed Education provided;Falls prevention discussed     Assessment: EMMI stroke transition program.     Plan: RNCM will follow up with patient within 1 week.  Patient and or wife will report reviewing/ receiving EMMI education material   [name] [facility] [place] Coordinator [phone]

## 2015-08-13 DIAGNOSIS — E1122 Type 2 diabetes mellitus with diabetic chronic kidney disease: Secondary | ICD-10-CM | POA: Diagnosis not present

## 2015-08-13 DIAGNOSIS — E1140 Type 2 diabetes mellitus with diabetic neuropathy, unspecified: Secondary | ICD-10-CM | POA: Diagnosis not present

## 2015-08-13 DIAGNOSIS — N183 Chronic kidney disease, stage 3 (moderate): Secondary | ICD-10-CM | POA: Diagnosis not present

## 2015-08-13 DIAGNOSIS — I129 Hypertensive chronic kidney disease with stage 1 through stage 4 chronic kidney disease, or unspecified chronic kidney disease: Secondary | ICD-10-CM | POA: Diagnosis not present

## 2015-08-13 DIAGNOSIS — I69322 Dysarthria following cerebral infarction: Secondary | ICD-10-CM | POA: Diagnosis not present

## 2015-08-13 DIAGNOSIS — I69354 Hemiplegia and hemiparesis following cerebral infarction affecting left non-dominant side: Secondary | ICD-10-CM | POA: Diagnosis not present

## 2015-08-16 DIAGNOSIS — N183 Chronic kidney disease, stage 3 (moderate): Secondary | ICD-10-CM | POA: Diagnosis not present

## 2015-08-16 DIAGNOSIS — I69322 Dysarthria following cerebral infarction: Secondary | ICD-10-CM | POA: Diagnosis not present

## 2015-08-16 DIAGNOSIS — I69354 Hemiplegia and hemiparesis following cerebral infarction affecting left non-dominant side: Secondary | ICD-10-CM | POA: Diagnosis not present

## 2015-08-16 DIAGNOSIS — E1122 Type 2 diabetes mellitus with diabetic chronic kidney disease: Secondary | ICD-10-CM | POA: Diagnosis not present

## 2015-08-16 DIAGNOSIS — I129 Hypertensive chronic kidney disease with stage 1 through stage 4 chronic kidney disease, or unspecified chronic kidney disease: Secondary | ICD-10-CM | POA: Diagnosis not present

## 2015-08-16 DIAGNOSIS — E1140 Type 2 diabetes mellitus with diabetic neuropathy, unspecified: Secondary | ICD-10-CM | POA: Diagnosis not present

## 2015-08-17 DIAGNOSIS — E1122 Type 2 diabetes mellitus with diabetic chronic kidney disease: Secondary | ICD-10-CM | POA: Diagnosis not present

## 2015-08-17 DIAGNOSIS — I129 Hypertensive chronic kidney disease with stage 1 through stage 4 chronic kidney disease, or unspecified chronic kidney disease: Secondary | ICD-10-CM | POA: Diagnosis not present

## 2015-08-17 DIAGNOSIS — I69322 Dysarthria following cerebral infarction: Secondary | ICD-10-CM | POA: Diagnosis not present

## 2015-08-17 DIAGNOSIS — N183 Chronic kidney disease, stage 3 (moderate): Secondary | ICD-10-CM | POA: Diagnosis not present

## 2015-08-17 DIAGNOSIS — I69354 Hemiplegia and hemiparesis following cerebral infarction affecting left non-dominant side: Secondary | ICD-10-CM | POA: Diagnosis not present

## 2015-08-17 DIAGNOSIS — E1140 Type 2 diabetes mellitus with diabetic neuropathy, unspecified: Secondary | ICD-10-CM | POA: Diagnosis not present

## 2015-08-18 DIAGNOSIS — I69322 Dysarthria following cerebral infarction: Secondary | ICD-10-CM | POA: Diagnosis not present

## 2015-08-18 DIAGNOSIS — I69354 Hemiplegia and hemiparesis following cerebral infarction affecting left non-dominant side: Secondary | ICD-10-CM | POA: Diagnosis not present

## 2015-08-18 DIAGNOSIS — E1122 Type 2 diabetes mellitus with diabetic chronic kidney disease: Secondary | ICD-10-CM | POA: Diagnosis not present

## 2015-08-18 DIAGNOSIS — E1140 Type 2 diabetes mellitus with diabetic neuropathy, unspecified: Secondary | ICD-10-CM | POA: Diagnosis not present

## 2015-08-18 DIAGNOSIS — I129 Hypertensive chronic kidney disease with stage 1 through stage 4 chronic kidney disease, or unspecified chronic kidney disease: Secondary | ICD-10-CM | POA: Diagnosis not present

## 2015-08-18 DIAGNOSIS — N183 Chronic kidney disease, stage 3 (moderate): Secondary | ICD-10-CM | POA: Diagnosis not present

## 2015-08-19 ENCOUNTER — Other Ambulatory Visit: Payer: Self-pay

## 2015-08-19 NOTE — Patient Outreach (Signed)
[facility]) Care Management  08/19/2015  Kristopher Zimmerman 1923/08/23 [id number]  SUBJECTIVE:  Telephone call to patient regarding EMMI stroke program follow up.  HIPAA verified with patient. Patient states he is doing about the same. Patient states he had his physical therapy with Beth from home health on yesterday. Patient state he felt his therapy went well. Patient gave verbal authorization to speak with his wife, Kristopher Zimmerman.  Wife states patients cold is better. States patient had 3 days left on his antibiotic. Wife states she ordered patient a walker with a seat on yesterday with the guidance of the physical therapist.  Wife states she thinks this will be beneficial for patient so that he is able to sit down and rest periodically.  Wife acknowledged that patient did not want to initially do therapy on yesterday due to being depressed. Wife states once patient was able to go outside and do some exercises with the therapist he did much better.  Wife states patient had some depression due not being able to do the activities he use to like golf and yard work.  Wife states patient seems to do and feel better when he is able to get out.  Wife states she took patient out to the store and for a hair cut. Wife states this lifted patients spirits.   Patient presently with company at the house. Wife request further discussion regarding depression at next outreach with [facility].  Wife agreed with follow up call with RNCM within 1 week.   ASSESSMENT:  EMMI stroke transition program.   PLAN: RNCM will attempt follow up with patient within 1 week.  [name] RN,BSN,CCM [facility] Telephonic  [phone]   RNCM will discuss depression with patient/wife as requested.

## 2015-08-20 DIAGNOSIS — I69322 Dysarthria following cerebral infarction: Secondary | ICD-10-CM | POA: Diagnosis not present

## 2015-08-20 DIAGNOSIS — E1140 Type 2 diabetes mellitus with diabetic neuropathy, unspecified: Secondary | ICD-10-CM | POA: Diagnosis not present

## 2015-08-20 DIAGNOSIS — N183 Chronic kidney disease, stage 3 (moderate): Secondary | ICD-10-CM | POA: Diagnosis not present

## 2015-08-20 DIAGNOSIS — I69354 Hemiplegia and hemiparesis following cerebral infarction affecting left non-dominant side: Secondary | ICD-10-CM | POA: Diagnosis not present

## 2015-08-20 DIAGNOSIS — E1122 Type 2 diabetes mellitus with diabetic chronic kidney disease: Secondary | ICD-10-CM | POA: Diagnosis not present

## 2015-08-20 DIAGNOSIS — I129 Hypertensive chronic kidney disease with stage 1 through stage 4 chronic kidney disease, or unspecified chronic kidney disease: Secondary | ICD-10-CM | POA: Diagnosis not present

## 2015-08-24 DIAGNOSIS — I129 Hypertensive chronic kidney disease with stage 1 through stage 4 chronic kidney disease, or unspecified chronic kidney disease: Secondary | ICD-10-CM | POA: Diagnosis not present

## 2015-08-24 DIAGNOSIS — I69322 Dysarthria following cerebral infarction: Secondary | ICD-10-CM | POA: Diagnosis not present

## 2015-08-24 DIAGNOSIS — I69354 Hemiplegia and hemiparesis following cerebral infarction affecting left non-dominant side: Secondary | ICD-10-CM | POA: Diagnosis not present

## 2015-08-24 DIAGNOSIS — E1140 Type 2 diabetes mellitus with diabetic neuropathy, unspecified: Secondary | ICD-10-CM | POA: Diagnosis not present

## 2015-08-24 DIAGNOSIS — E1122 Type 2 diabetes mellitus with diabetic chronic kidney disease: Secondary | ICD-10-CM | POA: Diagnosis not present

## 2015-08-24 DIAGNOSIS — N183 Chronic kidney disease, stage 3 (moderate): Secondary | ICD-10-CM | POA: Diagnosis not present

## 2015-08-25 DIAGNOSIS — I69322 Dysarthria following cerebral infarction: Secondary | ICD-10-CM | POA: Diagnosis not present

## 2015-08-25 DIAGNOSIS — I69354 Hemiplegia and hemiparesis following cerebral infarction affecting left non-dominant side: Secondary | ICD-10-CM | POA: Diagnosis not present

## 2015-08-25 DIAGNOSIS — N183 Chronic kidney disease, stage 3 (moderate): Secondary | ICD-10-CM | POA: Diagnosis not present

## 2015-08-25 DIAGNOSIS — E1122 Type 2 diabetes mellitus with diabetic chronic kidney disease: Secondary | ICD-10-CM | POA: Diagnosis not present

## 2015-08-25 DIAGNOSIS — E1140 Type 2 diabetes mellitus with diabetic neuropathy, unspecified: Secondary | ICD-10-CM | POA: Diagnosis not present

## 2015-08-25 DIAGNOSIS — I129 Hypertensive chronic kidney disease with stage 1 through stage 4 chronic kidney disease, or unspecified chronic kidney disease: Secondary | ICD-10-CM | POA: Diagnosis not present

## 2015-08-26 ENCOUNTER — Other Ambulatory Visit: Payer: Self-pay

## 2015-08-26 DIAGNOSIS — N183 Chronic kidney disease, stage 3 (moderate): Secondary | ICD-10-CM | POA: Diagnosis not present

## 2015-08-26 DIAGNOSIS — I129 Hypertensive chronic kidney disease with stage 1 through stage 4 chronic kidney disease, or unspecified chronic kidney disease: Secondary | ICD-10-CM | POA: Diagnosis not present

## 2015-08-26 DIAGNOSIS — I69354 Hemiplegia and hemiparesis following cerebral infarction affecting left non-dominant side: Secondary | ICD-10-CM | POA: Diagnosis not present

## 2015-08-26 DIAGNOSIS — E1122 Type 2 diabetes mellitus with diabetic chronic kidney disease: Secondary | ICD-10-CM | POA: Diagnosis not present

## 2015-08-26 DIAGNOSIS — E1140 Type 2 diabetes mellitus with diabetic neuropathy, unspecified: Secondary | ICD-10-CM | POA: Diagnosis not present

## 2015-08-26 DIAGNOSIS — I69322 Dysarthria following cerebral infarction: Secondary | ICD-10-CM | POA: Diagnosis not present

## 2015-08-26 NOTE — Addendum Note (Signed)
Addended by: [name] E on: 08/26/2015 04:08 PM   Modules accepted: Medications

## 2015-08-26 NOTE — Patient Outreach (Signed)
[facility]) Care Management  08/26/2015  Kristopher Zimmerman 1923-08-20 [id number]  SUBJECTIVE :  Telephone call to patient regarding EMMI stroke follow up. Wife states patient is presently having physical therapy with home health therapist. Patient has given verbal authorization to speak with his wife, [name]. Wife states patient is doing much better.  Wife states, "I can tell he is getting stronger." Wife states patient has received his walker. Wife states physical therapist is helping patient to use his walker. Wife states she can tell patients posture is straighter as he is walking with his walker. Wife states patients cold is better. States patient has completed his antibiotics. Wife states patient had a low grade temperature of 99.0 last night. Wife states she gave patient his cough medicine.  Wife states patients temperature today is within normal limits. Wife states patients blood pressure today is 130/70 with pulse of 73.  States patients fasting blood sugar was 142 today. Wife states she was concerned about patients pulse today.  States patients pulse this morning before getting up was 115.  Patient states she is aware this could be a side effect of some of the patients medications. RNCM advised wife to notify patients doctor of elevated pulse rate. Wife states patient refuses Education officer, museum for assistance with depression and refuses additional medication for depression.  Wife states patient is currently on cymbalta. Wife states she thinks the doctor prescribed it for another reason but is aware that it helps with depression as well. Wife denies patient reporting any unusual symptoms.  Denies patient having any difficulty since last telephone outreach with [facility].  Wife states she needs to speak with therapist before she leave.  Wife agreed to follow up phone call with patient/or herself within 1 week.   ASSESSMENT: EMMI stroke transition program.   PLAN:  RNCM will follow up  with patient within 1 week.   [name] RN,BSN,CCM [facility] Telephonic  [phone]

## 2015-09-01 ENCOUNTER — Ambulatory Visit (INDEPENDENT_AMBULATORY_CARE_PROVIDER_SITE_OTHER): Payer: Medicare Other | Admitting: Neurology

## 2015-09-01 ENCOUNTER — Encounter: Payer: Self-pay | Admitting: Neurology

## 2015-09-01 VITALS — BP 138/68 | HR 69 | Ht 69.5 in | Wt 207.0 lb

## 2015-09-01 DIAGNOSIS — G467 Other lacunar syndromes: Secondary | ICD-10-CM | POA: Insufficient documentation

## 2015-09-01 DIAGNOSIS — G8190 Hemiplegia, unspecified affecting unspecified side: Secondary | ICD-10-CM

## 2015-09-01 DIAGNOSIS — I69354 Hemiplegia and hemiparesis following cerebral infarction affecting left non-dominant side: Secondary | ICD-10-CM | POA: Diagnosis not present

## 2015-09-01 DIAGNOSIS — E1140 Type 2 diabetes mellitus with diabetic neuropathy, unspecified: Secondary | ICD-10-CM | POA: Diagnosis not present

## 2015-09-01 DIAGNOSIS — E1122 Type 2 diabetes mellitus with diabetic chronic kidney disease: Secondary | ICD-10-CM | POA: Diagnosis not present

## 2015-09-01 DIAGNOSIS — I69322 Dysarthria following cerebral infarction: Secondary | ICD-10-CM | POA: Diagnosis not present

## 2015-09-01 DIAGNOSIS — I129 Hypertensive chronic kidney disease with stage 1 through stage 4 chronic kidney disease, or unspecified chronic kidney disease: Secondary | ICD-10-CM | POA: Diagnosis not present

## 2015-09-01 DIAGNOSIS — N183 Chronic kidney disease, stage 3 (moderate): Secondary | ICD-10-CM | POA: Diagnosis not present

## 2015-09-01 NOTE — Patient Instructions (Signed)
I had a long d/w patient and wife about his recent stroke, risk for recurrent stroke/TIAs, personally independently reviewed imaging studies and stroke evaluation results and answered questions.Continue Plavix  for secondary stroke prevention and maintain strict control of hypertension with blood pressure goal below 130/90, diabetes with hemoglobin A1c goal below 6.5% and lipids with LDL cholesterol goal below 70 mg/dL. I also advised the patient to eat a healthy diet with plenty of whole grains, cereals, fruits and vegetables, exercise regularly and maintain ideal body weight. He was advised to use his walker at all times and fall and safety precautions. Continue home physical and occupational therapy. Followup in the future with Gilford Raid, nurse practitioner in 3 months or call earlier if necessary. Stroke Prevention Some medical conditions and behaviors are associated with an increased chance of having a stroke. You may prevent a stroke by making healthy choices and managing medical conditions. HOW CAN I REDUCE MY RISK OF HAVING A STROKE?   Stay physically active. Get at least 30 minutes of activity on most or all days.  Do not smoke. It may also be helpful to avoid exposure to secondhand smoke.  Limit alcohol use. Moderate alcohol use is considered to be:  No more than 2 drinks per day for men.  No more than 1 drink per day for nonpregnant women.  Eat healthy foods. This involves:  Eating 5 or more servings of fruits and vegetables a day.  Making dietary changes that address high blood pressure (hypertension), high cholesterol, diabetes, or obesity.  Manage your cholesterol levels.  Making food choices that are high in fiber and low in saturated fat, trans fat, and cholesterol may control cholesterol levels.  Take any prescribed medicines to control cholesterol as directed by your health care provider.  Manage your diabetes.  Controlling your carbohydrate and sugar intake is  recommended to manage diabetes.  Take any prescribed medicines to control diabetes as directed by your health care provider.  Control your hypertension.  Making food choices that are low in salt (sodium), saturated fat, trans fat, and cholesterol is recommended to manage hypertension.  Ask your health care provider if you need treatment to lower your blood pressure. Take any prescribed medicines to control hypertension as directed by your health care provider.  If you are 64-90 years of age, have your blood pressure checked every 3-5 years. If you are 3 years of age or older, have your blood pressure checked every year.  Maintain a healthy weight.  Reducing calorie intake and making food choices that are low in sodium, saturated fat, trans fat, and cholesterol are recommended to manage weight.  Stop drug abuse.  Avoid taking birth control pills.  Talk to your health care provider about the risks of taking birth control pills if you are over 98 years old, smoke, get migraines, or have ever had a blood clot.  Get evaluated for sleep disorders (sleep apnea).  Talk to your health care provider about getting a sleep evaluation if you snore a lot or have excessive sleepiness.  Take medicines only as directed by your health care provider.  For some people, aspirin or blood thinners (anticoagulants) are helpful in reducing the risk of forming abnormal blood clots that can lead to stroke. If you have the irregular heart rhythm of atrial fibrillation, you should be on a blood thinner unless there is a good reason you cannot take them.  Understand all your medicine instructions.  Make sure that other conditions (such as  anemia or atherosclerosis) are addressed. SEEK IMMEDIATE MEDICAL CARE IF:   You have sudden weakness or numbness of the face, arm, or leg, especially on one side of the body.  Your face or eyelid droops to one side.  You have sudden confusion.  You have trouble speaking  (aphasia) or understanding.  You have sudden trouble seeing in one or both eyes.  You have sudden trouble walking.  You have dizziness.  You have a loss of balance or coordination.  You have a sudden, severe headache with no known cause.  You have new chest pain or an irregular heartbeat. Any of these symptoms may represent a serious problem that is an emergency. Do not wait to see if the symptoms will go away. Get medical help at once. Call your local emergency services (911 in [place] Do not drive yourself to the hospital.   This information is not intended to replace advice given to you by your health care provider. Make sure you discuss any questions you have with your health care provider.   Document Released: 08/24/2004 Document Revised: 08/07/2014 Document Reviewed: 01/17/2013 Elsevier Interactive Patient Education 2016 [place] in the Home  Falls can cause injuries and can affect people from all age groups. There are many simple things that you can do to make your home safe and to help prevent falls. WHAT CAN I DO ON THE OUTSIDE OF MY HOME?  Regularly repair the edges of walkways and driveways and fix any cracks.  Remove high doorway thresholds.  Trim any shrubbery on the main path into your home.  Use bright outdoor lighting.  Clear walkways of debris and clutter, including tools and rocks.  Regularly check that handrails are securely fastened and in good repair. Both sides of any steps should have handrails.  Install guardrails along the edges of any raised decks or porches.  Have leaves, snow, and ice cleared regularly.  Use sand or salt on walkways during winter months.  In the garage, clean up any spills right away, including grease or oil spills. WHAT CAN I DO IN THE BATHROOM?  Use night lights.  Install grab bars by the toilet and in the tub and shower. Do not use towel bars as grab bars.  Use non-skid mats or decals on the floor  of the tub or shower.  If you need to sit down while you are in the shower, use a plastic, non-slip stool.Marland Kitchen  Keep the floor dry. Immediately clean up any water that spills on the floor.  Remove soap buildup in the tub or shower on a regular basis.  Attach bath mats securely with double-sided non-slip rug tape.  Remove throw rugs and other tripping hazards from the floor. WHAT CAN I DO IN THE BEDROOM?  Use night lights.  Make sure that a bedside light is easy to reach.  Do not use oversized bedding that drapes onto the floor.  Have a firm chair that has side arms to use for getting dressed.  Remove throw rugs and other tripping hazards from the floor. WHAT CAN I DO IN THE KITCHEN?   Clean up any spills right away.  Avoid walking on wet floors.  Place frequently used items in easy-to-reach places.  If you need to reach for something above you, use a sturdy step stool that has a grab bar.  Keep electrical cables out of the way.  Do not use floor polish or wax that makes floors slippery. If you have  to use wax, make sure that it is non-skid floor wax.  Remove throw rugs and other tripping hazards from the floor. WHAT CAN I DO IN THE STAIRWAYS?  Do not leave any items on the stairs.  Make sure that there are handrails on both sides of the stairs. Fix handrails that are broken or loose. Make sure that handrails are as long as the stairways.  Check any carpeting to make sure that it is firmly attached to the stairs. Fix any carpet that is loose or worn.  Avoid having throw rugs at the top or bottom of stairways, or secure the rugs with carpet tape to prevent them from moving.  Make sure that you have a light switch at the top of the stairs and the bottom of the stairs. If you do not have them, have them installed. WHAT ARE SOME OTHER FALL PREVENTION TIPS?  Wear closed-toe shoes that fit well and support your feet. Wear shoes that have rubber soles or low heels.  When you  use a stepladder, make sure that it is completely opened and that the sides are firmly locked. Have someone hold the ladder while you are using it. Do not climb a closed stepladder.  Add color or contrast paint or tape to grab bars and handrails in your home. Place contrasting color strips on the first and last steps.  Use mobility aids as needed, such as canes, walkers, scooters, and crutches.  Turn on lights if it is dark. Replace any light bulbs that burn out.  Set up furniture so that there are clear paths. Keep the furniture in the same spot.  Fix any uneven floor surfaces.  Choose a carpet design that does not hide the edge of steps of a stairway.  Be aware of any and all pets.  Review your medicines with your healthcare provider. Some medicines can cause dizziness or changes in blood pressure, which increase your risk of falling. Talk with your health care provider about other ways that you can decrease your risk of falls. This may include working with a physical therapist or trainer to improve your strength, balance, and endurance.   This information is not intended to replace advice given to you by your health care provider. Make sure you discuss any questions you have with your health care provider.   Document Released: 07/07/2002 Document Revised: 12/01/2014 Document Reviewed: 08/21/2014 Elsevier Interactive Patient Education [employer].

## 2015-09-01 NOTE — Progress Notes (Signed)
[facility] [address] [place]. [place] [address] [phone]       OFFICE FOLLOW-UP NOTE  [name] Date of Birth:  02-23-1924 Medical Record Number:  [id number]   HPI:82 y.o. male with a history of dm, htn who presented with slurred speech and increased falls since 11/27/14 He injured his toe fairly badly the day prior and was in the ER. He had taken his evening medications prior to coming in, so his wife did not think anything of the slurred speech and it was not mentioned to ER staff. CT scan of the head showed mild generalized atrophy and small vessel ischemic changes. MRI scan of the brain showed an acute sub-centimeter left basal ganglia/corona radiata infarct. MRA of the brain did not reveal large vessel stenosis. Carotid ultrasound showed no significant extracranial stenosis. Transthoracic echo showed normal ejection fraction of embolism. LDL cholesterol was normal at 43 mg percent. Hemoglobin A1c was slightly elevated at 7.0. Vascular risk factors identified included hypertension, diabetes and hyperlipidemia. Patient was seen by physical occupational speech therapy. He was started on aspirin 325 mg daily and advise aggressive risk factor management. Patient hast done well since discharge. His speech and has improved but he still has mild right facial droop. His right leg weakness is improved. His blood pressure is better controlled after additional metoprolol and is 116/65 today. He has no new complaints. He has mild lower extremity paresthesia [place]. 3. Diabetic neuropathy. He states his sugars are well controlled and hemoglobin A1c has not been repeated. Update 09/01/2015 : He returns for follow-up after last visit to [facility]. He was admitted to [facility] in December 2016 with a right pontine lacunar infarct. He had presented with facial droop and left-sided weakness. I have personally reviewed imaging studies and hospital stroke related testing. MRA  of the brain and neck but did not show significant stenosis. Carotid ultrasound was unremarkable. LDL cholesterol was 42 mg percent and hemoglobin A1c was 7.4. Patient was started on Plavix and states is tolerating it well. He is able to walk with a walker. He states he is learned to be careful and get up slowly. His balance is still not back to normal but his walking more confidently with a walker. He does get tired. He has to rest after walking short distances. He states his blood pressure control is good and it is 138/68 today in office. His tolerating Plavix well without bleeding or bruising. He is currently getting a home physical and occupational therapy. He is had no major falls. ROS:   14 system review of systems is positive for  fatigue, loss of vision, cough, constipation, dry mouth, incontinence of bladder, frequency of urination, memory loss, dizziness, numbness, back pain, walking difficulty, depression, snoring, sleep talking and all other systems negative  PMH:  Past Medical History  Diagnosis Date  . Diabetes mellitus without complication ([place])   . Neuropathy, diabetic ([place])   . Hypertension   . Renal insufficiency   . Stroke ([place])   . Macular degeneration of right eye   . Neuropathy [facility])     Social History:  Social History   Social History  . Marital Status: Married    Spouse Name: N/A  . Number of Children: N/A  . Years of Education: N/A   Occupational History  . Not on file.   Social History Main Topics  . Smoking status: Former Smoker    Quit date: 12/22/1979  . Smokeless tobacco: Not on  file  . Alcohol Use: 0.6 oz/week    1 Glasses of wine per week     Comment: wine  . Drug Use: No  . Sexual Activity: Not on file   Other Topics Concern  . Not on file   Social History Narrative    Medications:   Current Outpatient Prescriptions on File Prior to Visit  Medication Sig Dispense Refill  . B Complex-C (B-COMPLEX WITH VITAMIN C) tablet Take 1 tablet by  mouth daily.    . calcium carbonate (OS-CAL) 600 MG TABS Take 600 mg by mouth daily with breakfast.     . Carboxymethylcellulose Sodium (REFRESH LIQUIGEL OP) Apply 1 drop to eye 4 (four) times daily.    . clopidogrel (PLAVIX) 75 MG tablet Take 1 tablet (75 mg total) by mouth daily. 30 tablet 1  . cyclobenzaprine (FLEXERIL) 10 MG tablet Take 10 mg by mouth at bedtime.    . DULoxetine (CYMBALTA) 30 MG capsule Take 30 mg by mouth daily. Take with a 60 to make 90    . DULoxetine (CYMBALTA) 60 MG capsule Take 60 mg by mouth daily. Take with 30 to make 90    . glyBURIDE-metformin (GLUCOVANCE) 2.5-500 MG per tablet Take 2 tablets by mouth daily with breakfast. 2 tabs at breakfast and 1 at Supper    . hydrochlorothiazide (HYDRODIURIL) 25 MG tablet Take 25 mg by mouth daily.    . irbesartan (AVAPRO) 150 MG tablet Take 150 mg by mouth daily.    Marland Kitchen LORazepam (ATIVAN) 2 MG tablet Take 2 mg by mouth at bedtime.    . Magnesium Citrate 100 MG TABS Take 1 tablet by mouth daily.    . meloxicam (MOBIC) 15 MG tablet Take 15 mg by mouth at bedtime.    . metoprolol tartrate (LOPRESSOR) 25 MG tablet Take 0.5 tablets (12.5 mg total) by mouth 2 (two) times daily. 60 tablet 1  . mirabegron ER (MYRBETRIQ) 25 MG TB24 tablet Take 25 mg by mouth daily.    . Multiple Vitamins-Minerals (PRESERVISION AREDS 2) CAPS Take by mouth.    . pramipexole (MIRAPEX) 0.5 MG tablet Take 0.5 mg by mouth at bedtime.     . pravastatin (PRAVACHOL) 40 MG tablet Take 40 mg by mouth daily.    Marland Kitchen Propylene Glycol (SYSTANE BALANCE) 0.6 % SOLN Apply 1 drop to eye 4 (four) times daily.    . tamsulosin (FLOMAX) 0.4 MG CAPS Take by mouth.    . vitamin C (ASCORBIC ACID) 500 MG tablet Take 500 mg by mouth daily.     No current facility-administered medications on file prior to visit.    Allergies:  No Known Allergies  Physical Exam General: well developed, well nourished elderly male, seated, in no evident distress Head: head normocephalic and  atraumatic.  Neck: supple with no carotid or supraclavicular bruits Cardiovascular: regular rate and rhythm, no murmurs Musculoskeletal: no deformity Skin:  no rash/petichiae Vascular:  Normal pulses all extremities Filed Vitals:   09/01/15 1518  BP: 138/68  Pulse: 69   Neurologic Exam Mental Status: Awake and fully alert. Oriented to place and time. Recent and remote memory intact. Attention span, concentration and fund of knowledge appropriate. Mood and affect appropriate.  Cranial Nerves: Fundoscopic exam not done   Pupils equal, briskly reactive to light. Extraocular movements full without nystagmus. Visual fields full to confrontation. Hearing intact. Facial sensation intact. Mild right lower face weakness but, tongue, palate moves normally and symmetrically.  Motor: Normal bulk and tone. Normal  strength in all tested extremity muscles. Except mild bilateral ankle dorsiflexor and Mild right grip weakness. Diminished fine finger movements on the right. Orbits left over right upper extremity. Sensory.: intact to touch ,pinprick  But diminished .position and vibratory sensation over toes bilaterally.  Coordination: Rapid alternating movements normal in all extremities. Finger-to-nose and heel-to-shin performed accurately bilaterally. Gait and Station: Arises from chair without difficulty. Stance is stooped. Uses a walker.. Gait demonstrates normal stride length and balance . Able to heel, toe and tandem walk with slight difficulty.  Reflexes: 1+ and symmetric except ankle jerks are absent.. Toes downgoing.       ASSESSMENT: 82 year male with left basal ganglia subcortical infarct secondary to small vessel disease in April 2016 with vascular risk factors of diabetes and hyperlipidemia. New right pontine lacunar infarct in December 2016.     PLAN: I had a long d/w patient and wife about his recent stroke, risk for recurrent stroke/TIAs, personally independently reviewed imaging studies  and stroke evaluation results and answered questions.Continue Plavix  for secondary stroke prevention and maintain strict control of hypertension with blood pressure goal below 130/90, diabetes with hemoglobin A1c goal below 6.5% and lipids with LDL cholesterol goal below 70 mg/dL. I also advised the patient to eat a healthy diet with plenty of whole grains, cereals, fruits and vegetables, exercise regularly and maintain ideal body weight. He was advised to use his walker at all times and fall and safety precautions. Continue home physical and occupational therapy. Greater than 50% of time during this 25 minute visit was spent on counseling and coordination of care. Followup in the future with Gilford Raid, nurse practitioner in 3 months or call earlier if necessary.   Antony Contras, MD S   Note: This document was prepared with digital dictation and possible smart phrase technology. Any transcriptional errors that result from this process are unintentional

## 2015-09-02 ENCOUNTER — Ambulatory Visit: Payer: Medicare Other | Admitting: Neurology

## 2015-09-03 ENCOUNTER — Other Ambulatory Visit: Payer: Self-pay

## 2015-09-03 DIAGNOSIS — I69354 Hemiplegia and hemiparesis following cerebral infarction affecting left non-dominant side: Secondary | ICD-10-CM | POA: Diagnosis not present

## 2015-09-03 DIAGNOSIS — E1140 Type 2 diabetes mellitus with diabetic neuropathy, unspecified: Secondary | ICD-10-CM | POA: Diagnosis not present

## 2015-09-03 DIAGNOSIS — N183 Chronic kidney disease, stage 3 (moderate): Secondary | ICD-10-CM | POA: Diagnosis not present

## 2015-09-03 DIAGNOSIS — I69322 Dysarthria following cerebral infarction: Secondary | ICD-10-CM | POA: Diagnosis not present

## 2015-09-03 DIAGNOSIS — I129 Hypertensive chronic kidney disease with stage 1 through stage 4 chronic kidney disease, or unspecified chronic kidney disease: Secondary | ICD-10-CM | POA: Diagnosis not present

## 2015-09-03 DIAGNOSIS — E1122 Type 2 diabetes mellitus with diabetic chronic kidney disease: Secondary | ICD-10-CM | POA: Diagnosis not present

## 2015-09-03 NOTE — Patient Outreach (Addendum)
[name] Care Management  09/03/2015  Kristopher Zimmerman 1924-05-16 [id number]   SUBJECTIVE:  Telephone call to patient regarding EMMI stroke program follow up.  HIPAA verified with patient.  Patient gave verbal authorization to speak with he and his wife together.  Wife states patient had home physical therapy today.  Wife states patients primary MD ordered patient to have 6 additional therapy visits at the request of the physical therapist. Wife states therapist wants to assist patient with moving from using a walker to cane. Wife states patient is progressing well. Wife states patients brother passed away a few months ago and patient is still dealing with this.  Wife states patient has someone coming to their home in approximately 30 minutes for patient to sign some paper work.  Wife states patient saw neurologist, Dr. Leonie Zimmerman on 09/01/15.  Wife states appointment went well. States Dr. Leonie Zimmerman was pleased with patients progression.  Wife denies any changes to patients treatment. Wife states Dr. Leonie Zimmerman was in agreement to patient continuing with home physical therapy.  Wife states patient has a follow up visit at Dr. Clydene Zimmerman office on November 24, 2015. Wife states patient has a follow up visit with his primary MD on 12/01/15.   Wife verbalized awareness of stroke like symptoms: severe headache, weakness on one side of body, slurred speech.  RNCM advised patient/ wife of patient keeping follow up doctor appointments, reporting any unusual symptoms to his doctor, call 911 for stroke like symptoms, and take medications as prescribed.  Wife and patient verbalized understanding. Wife and patient aware of closure to EMMI stroke program. Wife/patient verbalized understanding and agreement. Both wife and patient expressed their appreciation to [facility] for the follow up.   ASSESSMENT:  EMMI stroke transition program. Patient continues with strong family support. Patient self managing care with assistance of  his wife.   PLAN: RNCM will refer patient to Kristopher Zimmerman to close to [facility] stroke program due to goals being met.    [name] RN,BSN,CCM [facility] Telephonic  [phone]

## 2015-09-07 DIAGNOSIS — I10 Essential (primary) hypertension: Secondary | ICD-10-CM | POA: Diagnosis not present

## 2015-09-07 DIAGNOSIS — E782 Mixed hyperlipidemia: Secondary | ICD-10-CM | POA: Diagnosis not present

## 2015-09-08 ENCOUNTER — Encounter (INDEPENDENT_AMBULATORY_CARE_PROVIDER_SITE_OTHER): Payer: Medicare Other | Admitting: Ophthalmology

## 2015-09-08 DIAGNOSIS — E113292 Type 2 diabetes mellitus with mild nonproliferative diabetic retinopathy without macular edema, left eye: Secondary | ICD-10-CM

## 2015-09-08 DIAGNOSIS — H43813 Vitreous degeneration, bilateral: Secondary | ICD-10-CM

## 2015-09-08 DIAGNOSIS — H353231 Exudative age-related macular degeneration, bilateral, with active choroidal neovascularization: Secondary | ICD-10-CM | POA: Diagnosis not present

## 2015-09-08 DIAGNOSIS — H35033 Hypertensive retinopathy, bilateral: Secondary | ICD-10-CM

## 2015-09-08 DIAGNOSIS — I10 Essential (primary) hypertension: Secondary | ICD-10-CM | POA: Diagnosis not present

## 2015-09-08 DIAGNOSIS — N183 Chronic kidney disease, stage 3 (moderate): Secondary | ICD-10-CM | POA: Diagnosis not present

## 2015-09-08 DIAGNOSIS — I69354 Hemiplegia and hemiparesis following cerebral infarction affecting left non-dominant side: Secondary | ICD-10-CM | POA: Diagnosis not present

## 2015-09-08 DIAGNOSIS — E11311 Type 2 diabetes mellitus with unspecified diabetic retinopathy with macular edema: Secondary | ICD-10-CM | POA: Diagnosis not present

## 2015-09-08 DIAGNOSIS — E1122 Type 2 diabetes mellitus with diabetic chronic kidney disease: Secondary | ICD-10-CM | POA: Diagnosis not present

## 2015-09-08 DIAGNOSIS — E1140 Type 2 diabetes mellitus with diabetic neuropathy, unspecified: Secondary | ICD-10-CM | POA: Diagnosis not present

## 2015-09-08 DIAGNOSIS — I69322 Dysarthria following cerebral infarction: Secondary | ICD-10-CM | POA: Diagnosis not present

## 2015-09-08 DIAGNOSIS — I129 Hypertensive chronic kidney disease with stage 1 through stage 4 chronic kidney disease, or unspecified chronic kidney disease: Secondary | ICD-10-CM | POA: Diagnosis not present

## 2015-09-08 DIAGNOSIS — E113211 Type 2 diabetes mellitus with mild nonproliferative diabetic retinopathy with macular edema, right eye: Secondary | ICD-10-CM | POA: Diagnosis not present

## 2015-09-09 DIAGNOSIS — I69354 Hemiplegia and hemiparesis following cerebral infarction affecting left non-dominant side: Secondary | ICD-10-CM | POA: Diagnosis not present

## 2015-09-09 DIAGNOSIS — N183 Chronic kidney disease, stage 3 (moderate): Secondary | ICD-10-CM | POA: Diagnosis not present

## 2015-09-09 DIAGNOSIS — I69322 Dysarthria following cerebral infarction: Secondary | ICD-10-CM | POA: Diagnosis not present

## 2015-09-09 DIAGNOSIS — E1122 Type 2 diabetes mellitus with diabetic chronic kidney disease: Secondary | ICD-10-CM | POA: Diagnosis not present

## 2015-09-09 DIAGNOSIS — I129 Hypertensive chronic kidney disease with stage 1 through stage 4 chronic kidney disease, or unspecified chronic kidney disease: Secondary | ICD-10-CM | POA: Diagnosis not present

## 2015-09-09 DIAGNOSIS — E1140 Type 2 diabetes mellitus with diabetic neuropathy, unspecified: Secondary | ICD-10-CM | POA: Diagnosis not present

## 2015-09-10 DIAGNOSIS — I129 Hypertensive chronic kidney disease with stage 1 through stage 4 chronic kidney disease, or unspecified chronic kidney disease: Secondary | ICD-10-CM | POA: Diagnosis not present

## 2015-09-10 DIAGNOSIS — N183 Chronic kidney disease, stage 3 (moderate): Secondary | ICD-10-CM | POA: Diagnosis not present

## 2015-09-10 DIAGNOSIS — E1140 Type 2 diabetes mellitus with diabetic neuropathy, unspecified: Secondary | ICD-10-CM | POA: Diagnosis not present

## 2015-09-10 DIAGNOSIS — I69354 Hemiplegia and hemiparesis following cerebral infarction affecting left non-dominant side: Secondary | ICD-10-CM | POA: Diagnosis not present

## 2015-09-10 DIAGNOSIS — I69322 Dysarthria following cerebral infarction: Secondary | ICD-10-CM | POA: Diagnosis not present

## 2015-09-10 DIAGNOSIS — E1122 Type 2 diabetes mellitus with diabetic chronic kidney disease: Secondary | ICD-10-CM | POA: Diagnosis not present

## 2015-09-13 DIAGNOSIS — E1140 Type 2 diabetes mellitus with diabetic neuropathy, unspecified: Secondary | ICD-10-CM | POA: Diagnosis not present

## 2015-09-13 DIAGNOSIS — E1122 Type 2 diabetes mellitus with diabetic chronic kidney disease: Secondary | ICD-10-CM | POA: Diagnosis not present

## 2015-09-13 DIAGNOSIS — I69354 Hemiplegia and hemiparesis following cerebral infarction affecting left non-dominant side: Secondary | ICD-10-CM | POA: Diagnosis not present

## 2015-09-13 DIAGNOSIS — I129 Hypertensive chronic kidney disease with stage 1 through stage 4 chronic kidney disease, or unspecified chronic kidney disease: Secondary | ICD-10-CM | POA: Diagnosis not present

## 2015-09-13 DIAGNOSIS — N183 Chronic kidney disease, stage 3 (moderate): Secondary | ICD-10-CM | POA: Diagnosis not present

## 2015-09-13 DIAGNOSIS — I69322 Dysarthria following cerebral infarction: Secondary | ICD-10-CM | POA: Diagnosis not present

## 2015-09-14 DIAGNOSIS — E782 Mixed hyperlipidemia: Secondary | ICD-10-CM | POA: Diagnosis not present

## 2015-09-14 DIAGNOSIS — Z8673 Personal history of transient ischemic attack (TIA), and cerebral infarction without residual deficits: Secondary | ICD-10-CM | POA: Diagnosis not present

## 2015-09-14 DIAGNOSIS — I10 Essential (primary) hypertension: Secondary | ICD-10-CM | POA: Diagnosis not present

## 2015-09-14 DIAGNOSIS — H40013 Open angle with borderline findings, low risk, bilateral: Secondary | ICD-10-CM | POA: Diagnosis not present

## 2015-09-15 DIAGNOSIS — E1122 Type 2 diabetes mellitus with diabetic chronic kidney disease: Secondary | ICD-10-CM | POA: Diagnosis not present

## 2015-09-15 DIAGNOSIS — E1140 Type 2 diabetes mellitus with diabetic neuropathy, unspecified: Secondary | ICD-10-CM | POA: Diagnosis not present

## 2015-09-15 DIAGNOSIS — I69354 Hemiplegia and hemiparesis following cerebral infarction affecting left non-dominant side: Secondary | ICD-10-CM | POA: Diagnosis not present

## 2015-09-15 DIAGNOSIS — I69322 Dysarthria following cerebral infarction: Secondary | ICD-10-CM | POA: Diagnosis not present

## 2015-09-15 DIAGNOSIS — I129 Hypertensive chronic kidney disease with stage 1 through stage 4 chronic kidney disease, or unspecified chronic kidney disease: Secondary | ICD-10-CM | POA: Diagnosis not present

## 2015-09-15 DIAGNOSIS — N183 Chronic kidney disease, stage 3 (moderate): Secondary | ICD-10-CM | POA: Diagnosis not present

## 2015-09-16 DIAGNOSIS — I69354 Hemiplegia and hemiparesis following cerebral infarction affecting left non-dominant side: Secondary | ICD-10-CM | POA: Diagnosis not present

## 2015-09-16 DIAGNOSIS — N183 Chronic kidney disease, stage 3 (moderate): Secondary | ICD-10-CM | POA: Diagnosis not present

## 2015-09-16 DIAGNOSIS — I69322 Dysarthria following cerebral infarction: Secondary | ICD-10-CM | POA: Diagnosis not present

## 2015-09-16 DIAGNOSIS — E1140 Type 2 diabetes mellitus with diabetic neuropathy, unspecified: Secondary | ICD-10-CM | POA: Diagnosis not present

## 2015-09-16 DIAGNOSIS — E1122 Type 2 diabetes mellitus with diabetic chronic kidney disease: Secondary | ICD-10-CM | POA: Diagnosis not present

## 2015-09-16 DIAGNOSIS — I129 Hypertensive chronic kidney disease with stage 1 through stage 4 chronic kidney disease, or unspecified chronic kidney disease: Secondary | ICD-10-CM | POA: Diagnosis not present

## 2015-09-21 DIAGNOSIS — E1140 Type 2 diabetes mellitus with diabetic neuropathy, unspecified: Secondary | ICD-10-CM | POA: Diagnosis not present

## 2015-09-21 DIAGNOSIS — I69322 Dysarthria following cerebral infarction: Secondary | ICD-10-CM | POA: Diagnosis not present

## 2015-09-21 DIAGNOSIS — I69354 Hemiplegia and hemiparesis following cerebral infarction affecting left non-dominant side: Secondary | ICD-10-CM | POA: Diagnosis not present

## 2015-09-21 DIAGNOSIS — N183 Chronic kidney disease, stage 3 (moderate): Secondary | ICD-10-CM | POA: Diagnosis not present

## 2015-09-21 DIAGNOSIS — E1122 Type 2 diabetes mellitus with diabetic chronic kidney disease: Secondary | ICD-10-CM | POA: Diagnosis not present

## 2015-09-21 DIAGNOSIS — I129 Hypertensive chronic kidney disease with stage 1 through stage 4 chronic kidney disease, or unspecified chronic kidney disease: Secondary | ICD-10-CM | POA: Diagnosis not present

## 2015-09-22 DIAGNOSIS — E1122 Type 2 diabetes mellitus with diabetic chronic kidney disease: Secondary | ICD-10-CM | POA: Diagnosis not present

## 2015-09-22 DIAGNOSIS — I69354 Hemiplegia and hemiparesis following cerebral infarction affecting left non-dominant side: Secondary | ICD-10-CM | POA: Diagnosis not present

## 2015-09-22 DIAGNOSIS — E1140 Type 2 diabetes mellitus with diabetic neuropathy, unspecified: Secondary | ICD-10-CM | POA: Diagnosis not present

## 2015-09-22 DIAGNOSIS — I129 Hypertensive chronic kidney disease with stage 1 through stage 4 chronic kidney disease, or unspecified chronic kidney disease: Secondary | ICD-10-CM | POA: Diagnosis not present

## 2015-09-22 DIAGNOSIS — N183 Chronic kidney disease, stage 3 (moderate): Secondary | ICD-10-CM | POA: Diagnosis not present

## 2015-09-22 DIAGNOSIS — I69322 Dysarthria following cerebral infarction: Secondary | ICD-10-CM | POA: Diagnosis not present

## 2015-09-23 DIAGNOSIS — I69322 Dysarthria following cerebral infarction: Secondary | ICD-10-CM | POA: Diagnosis not present

## 2015-09-23 DIAGNOSIS — I69354 Hemiplegia and hemiparesis following cerebral infarction affecting left non-dominant side: Secondary | ICD-10-CM | POA: Diagnosis not present

## 2015-09-23 DIAGNOSIS — N183 Chronic kidney disease, stage 3 (moderate): Secondary | ICD-10-CM | POA: Diagnosis not present

## 2015-09-23 DIAGNOSIS — E1122 Type 2 diabetes mellitus with diabetic chronic kidney disease: Secondary | ICD-10-CM | POA: Diagnosis not present

## 2015-09-23 DIAGNOSIS — I129 Hypertensive chronic kidney disease with stage 1 through stage 4 chronic kidney disease, or unspecified chronic kidney disease: Secondary | ICD-10-CM | POA: Diagnosis not present

## 2015-09-23 DIAGNOSIS — E1140 Type 2 diabetes mellitus with diabetic neuropathy, unspecified: Secondary | ICD-10-CM | POA: Diagnosis not present

## 2015-10-04 ENCOUNTER — Encounter: Payer: Self-pay | Admitting: Physical Therapy

## 2015-10-04 ENCOUNTER — Ambulatory Visit: Payer: Medicare Other | Attending: Internal Medicine | Admitting: Physical Therapy

## 2015-10-04 DIAGNOSIS — R262 Difficulty in walking, not elsewhere classified: Secondary | ICD-10-CM

## 2015-10-04 DIAGNOSIS — R531 Weakness: Secondary | ICD-10-CM | POA: Diagnosis not present

## 2015-10-04 DIAGNOSIS — W19XXXD Unspecified fall, subsequent encounter: Secondary | ICD-10-CM

## 2015-10-04 DIAGNOSIS — I6990 Unspecified sequelae of unspecified cerebrovascular disease: Secondary | ICD-10-CM | POA: Diagnosis not present

## 2015-10-04 DIAGNOSIS — R5381 Other malaise: Secondary | ICD-10-CM | POA: Diagnosis not present

## 2015-10-04 NOTE — Therapy (Signed)
[place] [place], [place], [address] Phone: [phone]   Fax:  [phone]  Physical Therapy Evaluation  Patient Details  Name: Kristopher Zimmerman MRN: [id number] Date of Birth: Sep 01, 1923 Referring Provider: Dr. Ashby Dawes  Encounter Date: 10/04/2015      PT End of Session - 10/04/15 1520    Visit Number 1   Date for PT Re-Evaluation 12/04/15   PT Start Time 1440   PT Stop Time 1540   PT Time Calculation (min) 60 min   Activity Tolerance Patient tolerated treatment well   Behavior During Therapy [facility] for tasks assessed/performed      Past Medical History  Diagnosis Date  . Diabetes mellitus without complication ([place])   . Neuropathy, diabetic ([place])   . Hypertension   . Renal insufficiency   . Stroke ([place])   . Macular degeneration of right eye   . Neuropathy [facility])     Past Surgical History  Procedure Laterality Date  . Back surgery    . Broken leg    . Cervical disc surgery    . Tonsillectomy    . Appendectomy      There were no vitals filed for this visit.  Visit Diagnosis:  Late effects of CVA (cerebrovascular accident) - Plan: PT plan of care cert/re-cert  Difficulty walking - Plan: PT plan of care cert/re-cert  Falls, subsequent encounter - Plan: PT plan of care cert/re-cert  Debility - Plan: PT plan of care cert/re-cert  Weakness - Plan: PT plan of care cert/re-cert      Subjective Assessment - 10/04/15 1447    Subjective Patient reports that he had a small stroke in April of last year, he reports that he had a more significant stroke on 07/25/15.  He stayed overnight in the hospital and then went home.  He had home PT until last week.  Has a history of neuropathy.  He is now having much more difficulty walking and with safety   Limitations Lifting;Standing;Walking;House hold activities   Patient Stated Goals walk   Currently in Pain? No/denies            [facility] PT  Assessment - 10/04/15 0001    Assessment   Medical Diagnosis s/p CVA, frequent falls   Referring Provider Dr. Ashby Dawes   Onset Date/Surgical Date 07/25/15   Prior Therapy Home PT   Precautions   Precautions Fall   Balance Screen   Has the patient fallen in the past 6 months Yes   How many times? 7   Has the patient had a decrease in activity level because of a fear of falling?  Yes   Is the patient reluctant to leave their home because of a fear of falling?  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, structural Comments fwd head, rounded shoulders   AROM   Overall AROM Comments Some ataxic motions of the Left LE, unable to DF past neutral for the bilateral ankles, hips and knees WFL's   Right Ankle Dorsiflexion 0   Left Ankle Dorsiflexion 0   Strength   Overall Strength Comments hips and knees 3+/5, ankles 3/5,  shoulder strength was 4/5   Palpation   Palpation comment mild tenderness in the lower leg   Ambulation/Gait   Gait Comments slow, shuffling gait, small steps uses a FWW, needs CGA, transfers were unsafe and impulsive, creeps along furniture, left the walker at the door   Berg Balance Test   Sit to Stand Needs minimal aid to stand or to stabilize   Standing Unsupported Able to stand 30 seconds unsupported   Sitting with Back Unsupported but Feet Supported on Floor or Stool Able to sit 2 minutes under supervision   Stand to Sit Uses backs of legs against chair to control descent   Transfers Able to transfer with verbal cueing and /or supervision   Standing Unsupported with Eyes Closed Able to stand 10 seconds with supervision   Standing Ubsupported with Feet Together Needs help to  attain position but able to stand for 30 seconds with feet together   From Standing, Reach Forward with Outstretched Arm Can reach forward >5 cm safely (2")   From Standing Position, Pick up Object from Floor Unable to pick up and needs supervision   From Standing Position, Turn to Look Behind Over each Shoulder Needs supervision when turning   Turn 360 Degrees Needs assistance while turning   Standing Unsupported, Alternately Place Feet on Step/Stool Able to complete >2 steps/needs minimal assist   Standing Unsupported, One Foot in [employer] balance while stepping or standing   Standing on One Leg Tries to lift leg/unable to hold 3 seconds but remains standing independently   Total Score 20   Timed Up and Go Test   Normal TUG (seconds) 36                   [facility] Adult PT Treatment/Exercise - 10/04/15 0001    Transfers   Comments needs hands and cues tends to fall backwards, he tends to leave the walker and creep along furniture, reaching and bending way out, in an unsafe manner   Exercises   Exercises Knee/Hip   Knee/Hip Exercises: Machines for Strengthening   Cybex Knee Extension 5# 2x10   Cybex Knee Flexion 25# 2x10                PT Education - 10/04/15 1519    Education provided Yes   Education Details went over safe transfers   Person(s) Educated Patient;Spouse   Methods Explanation;Demonstration   Comprehension Verbalized understanding          PT Short Term Goals - 10/04/15 1557    PT SHORT TERM GOAL #1   Title understand fall risks in the home   Time 2   Period Weeks   Status New           PT Long Term Goals - 10/04/15 1557    PT LONG TERM GOAL #1   Title demonstrate safe transfers in the clinic   Time 8   Period Weeks   Status New   PT LONG TERM GOAL #2   Title decrease timed up and go test time to 20 seconds   Time 8   Period Weeks   Status New   PT LONG TERM GOAL #3   Title increase berg balance test score to 40/56   Time 8    Period Weeks   Status New   PT LONG TERM GOAL #4   Title no falls over a 4 week period   Time 8   Period Weeks   Status New  PT LONG TERM GOAL #5   Title increase LE strength to 4/5   Time 8   Period Weeks   Status New               Plan - 2015/10/27 1544    Clinical Impression Statement Patient had a small stroke in April a year ago, reports no real residual effects of that.  He reports that he had a more significant stroke 07/25/15.  His wife reports about 5 falls in the past 2 months.  She reports that he has had declining ability to walk and be safe.  He was getting home PT until recently.  He was very unsafe with transfers.   Pt will benefit from skilled therapeutic intervention in order to improve on the following deficits Abnormal gait;Cardiopulmonary status limiting activity;Decreased activity tolerance;Decreased balance;Decreased mobility;Decreased range of motion;Decreased endurance;Decreased safety awareness;Difficulty walking   Rehab Potential Good   PT Frequency 2x / week   PT Duration 8 weeks   PT Treatment/Interventions ADLs/Self Care Home Management;Gait training;Stair training;Functional mobility training;Therapeutic activities;Therapeutic exercise;Manual techniques;Patient/family education;Balance training   PT Next Visit Plan slowly add exercises and balance, continue with safety instructions   Consulted and Agree with Plan of Care Patient          G-Codes - 10-27-15 1601    Functional Assessment Tool Used foto 60% limitation   Functional Limitation Mobility: Walking and moving around       Problem List Patient Active Problem List   Diagnosis Date Noted  . 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 HLD (hyperlipidemia)   . Acute CVA (cerebrovascular accident) ([place])   . Lacunar infarct, acute ([place]) 03/02/2015  . Diabetic neuropathy ([place]) 03/02/2015  . Stroke ([place]) 11/28/2014  . CVA (cerebral infarction) 11/28/2014  . Diabetes mellitus ([place])  11/28/2014  . Chronic kidney disease (CKD) stage G2/A1, mildly decreased glomerular filtration rate (GFR) between 60-89 mL/min/1.73 square meter and albuminuria creatinine ratio less than 30 mg/g 11/28/2014  . Neuropathy in diabetes ([place]) 11/28/2014  . Toe fracture, left 11/28/2014  . DM (diabetes mellitus) type II controlled with renal manifestation ([place]) 11/28/2014  . Essential hypertension 05/27/2014  . Hyperlipidemia 05/27/2014  . Aortic stenosis 05/27/2014  . Abnormal electrocardiogram 05/27/2014  . Chest pain 05/27/2014    Sumner Boast., PT 10/27/15, 4:07 PM  [address] [place], [place], [address] Phone: [phone]   Fax:  [phone]  Name: Kristopher Zimmerman MRN: [id number] Date of Birth: 14-Jan-1924

## 2015-10-06 ENCOUNTER — Ambulatory Visit: Payer: Medicare Other | Admitting: Physical Therapy

## 2015-10-06 ENCOUNTER — Encounter: Payer: Self-pay | Admitting: Physical Therapy

## 2015-10-06 DIAGNOSIS — R262 Difficulty in walking, not elsewhere classified: Secondary | ICD-10-CM

## 2015-10-06 DIAGNOSIS — R5381 Other malaise: Secondary | ICD-10-CM | POA: Diagnosis not present

## 2015-10-06 DIAGNOSIS — R531 Weakness: Secondary | ICD-10-CM

## 2015-10-06 DIAGNOSIS — W19XXXD Unspecified fall, subsequent encounter: Secondary | ICD-10-CM

## 2015-10-06 DIAGNOSIS — I6990 Unspecified sequelae of unspecified cerebrovascular disease: Secondary | ICD-10-CM | POA: Diagnosis not present

## 2015-10-06 NOTE — Therapy (Signed)
[place], [place], [address] Phone: [phone]   Fax:  [phone]  Physical Therapy Treatment  Patient Details  Name: Kristopher Zimmerman MRN: [id number] Date of Birth: 04-01-1924 Referring Provider: Dr. Ashby Dawes  Encounter Date: 10/06/2015      PT End of Session - 10/06/15 1512    Visit Number 2   Date for PT Re-Evaluation 12/04/15   PT Start Time 1431   PT Stop Time 1512   PT Time Calculation (min) 41 min      Past Medical History  Diagnosis Date  . Diabetes mellitus without complication ([place])   . Neuropathy, diabetic ([place])   . Hypertension   . Renal insufficiency   . Stroke ([place])   . Macular degeneration of right eye   . Neuropathy [facility])     Past Surgical History  Procedure Laterality Date  . Back surgery    . Broken leg    . Cervical disc surgery    . Tonsillectomy    . Appendectomy      There were no vitals filed for this visit.  Visit Diagnosis:  Late effects of CVA (cerebrovascular accident)  Falls, subsequent encounter  Debility  Weakness  Difficulty walking      Subjective Assessment - 10/06/15 1434    Subjective "Things has been so so" No falls    Currently in Pain? No/denies   Multiple Pain Sites No                         [facility] Adult PT Treatment/Exercise - 10/06/15 0001    Lumbar Exercises: Machines for Strengthening   Cybex Leg Press #20 2x10; no weight LLE only x10   Lumbar Exercises: Seated   Long Arc Quad on Chair Left;2 sets;15 reps   Other Seated Lumbar Exercises OHP yellow ball 2x10, Rows blue Tband 2x15    Other Seated Lumbar Exercises Seated march LLe #3 2x10    Knee/Hip Exercises: Aerobic   Nustep L4 x76min    Knee/Hip Exercises: Standing   Other Standing Knee Exercises Standing 15 sec no assist once balance establish  cues to get LLE under him.   Knee/Hip Exercises: Seated   Stool Scoot - Round Trips 2 Laps                    PT Short Term Goals - 10/04/15 1557    PT SHORT TERM GOAL #1   Title understand fall risks in the home   Time 2   Period Weeks   Status New           PT Long Term Goals - 10/04/15 1557    PT LONG TERM GOAL #1   Title demonstrate safe transfers in the clinic   Time 8   Period Weeks   Status New   PT LONG TERM GOAL #2   Title decrease timed up and go test time to 20 seconds   Time 8   Period Weeks   Status New   PT LONG TERM GOAL #3   Title increase berg balance test score to 40/56   Time 8   Period Weeks   Status New   PT LONG TERM GOAL #4   Title no falls over a 4 week period   Time 8   Period Weeks   Status New   PT LONG TERM GOAL #5   Title increase LE strength to  4/5   Time 8   Period Weeks   Status New               Plan - 10/06/15 1512    Clinical Impression Statement Pt with a progression to resistive exercises, additional time needed to get on and off leg press. PT tends to lean back when standing without AD. Posterior lean with seated OHP. Fatigues easily with LAQ and require HHA with all standing activities.    Pt will benefit from skilled therapeutic intervention in order to improve on the following deficits Abnormal gait;Cardiopulmonary status limiting activity;Decreased activity tolerance;Decreased balance;Decreased mobility;Decreased range of motion;Decreased endurance;Decreased safety awareness;Difficulty walking   Rehab Potential Good   PT Frequency 2x / week   PT Duration 8 weeks   PT Treatment/Interventions ADLs/Self Care Home Management;Gait training;Stair training;Functional mobility training;Therapeutic activities;Therapeutic exercise;Manual techniques;Patient/family education;Balance training   PT Next Visit Plan continue to slowly add exercises and balance, continue with safety instructions        Problem List Patient Active Problem List   Diagnosis Date Noted  . 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 HLD (hyperlipidemia)   . Acute CVA (cerebrovascular accident) ([place])   . Lacunar infarct, acute ([place]) 03/02/2015  . Diabetic neuropathy ([place]) 03/02/2015  . Stroke ([place]) 11/28/2014  . CVA (cerebral infarction) 11/28/2014  . Diabetes mellitus ([place]) 11/28/2014  . Chronic kidney disease (CKD) stage G2/A1, mildly decreased glomerular filtration rate (GFR) between 60-89 mL/min/1.73 square meter and albuminuria creatinine ratio less than 30 mg/g 11/28/2014  . Neuropathy in diabetes ([place]) 11/28/2014  . Toe fracture, left 11/28/2014  . DM (diabetes mellitus) type II controlled with renal manifestation ([place]) 11/28/2014  . Essential hypertension 05/27/2014  . Hyperlipidemia 05/27/2014  . Aortic stenosis 05/27/2014  . Abnormal electrocardiogram 05/27/2014  . Chest pain 05/27/2014    Scot Jun, PTA  10/06/2015, 3:16 PM  [place] [address] [place], [place], [address] Phone: [phone]   Fax:  [phone]  Name: Kristopher Zimmerman MRN: [id number] Date of Birth: May 02, 1924

## 2015-10-07 DIAGNOSIS — R531 Weakness: Secondary | ICD-10-CM | POA: Diagnosis not present

## 2015-10-07 DIAGNOSIS — I63431 Cerebral infarction due to embolism of right posterior cerebral artery: Secondary | ICD-10-CM | POA: Diagnosis not present

## 2015-10-07 DIAGNOSIS — R269 Unspecified abnormalities of gait and mobility: Secondary | ICD-10-CM | POA: Diagnosis not present

## 2015-10-07 DIAGNOSIS — E1142 Type 2 diabetes mellitus with diabetic polyneuropathy: Secondary | ICD-10-CM | POA: Diagnosis not present

## 2015-10-11 ENCOUNTER — Encounter: Payer: Self-pay | Admitting: Physical Therapy

## 2015-10-11 ENCOUNTER — Ambulatory Visit: Payer: Medicare Other | Admitting: Physical Therapy

## 2015-10-11 DIAGNOSIS — R262 Difficulty in walking, not elsewhere classified: Secondary | ICD-10-CM | POA: Diagnosis not present

## 2015-10-11 DIAGNOSIS — R531 Weakness: Secondary | ICD-10-CM

## 2015-10-11 DIAGNOSIS — R5381 Other malaise: Secondary | ICD-10-CM

## 2015-10-11 DIAGNOSIS — W19XXXD Unspecified fall, subsequent encounter: Secondary | ICD-10-CM

## 2015-10-11 DIAGNOSIS — I6990 Unspecified sequelae of unspecified cerebrovascular disease: Secondary | ICD-10-CM

## 2015-10-11 NOTE — Therapy (Signed)
[place], [place], [address] Phone: [phone]   Fax:  [phone]  Physical Therapy Treatment  Patient Details  Name: Kristopher Zimmerman MRN: [id number] Date of Birth: 09-06-1923 Referring Provider: Dr. Ashby Dawes  Encounter Date: 10/11/2015      PT End of Session - 10/11/15 1520    Visit Number 3   Date for PT Re-Evaluation 12/04/15   PT Start Time 1430   PT Stop Time 1516   PT Time Calculation (min) 46 min   Activity Tolerance Patient tolerated treatment well   Behavior During Therapy [facility] for tasks assessed/performed      Past Medical History  Diagnosis Date  . Diabetes mellitus without complication ([place])   . Neuropathy, diabetic ([place])   . Hypertension   . Renal insufficiency   . Stroke ([place])   . Macular degeneration of right eye   . Neuropathy [facility])     Past Surgical History  Procedure Laterality Date  . Back surgery    . Broken leg    . Cervical disc surgery    . Tonsillectomy    . Appendectomy      There were no vitals filed for this visit.  Visit Diagnosis:  Late effects of CVA (cerebrovascular accident)  Falls, subsequent encounter  Debility  Weakness  Difficulty walking      Subjective Assessment - 10/11/15 1432    Subjective "Things are the same" "I took a few steps without nothing"    Currently in Pain? No/denies   Multiple Pain Sites No                         [facility] Adult PT Treatment/Exercise - 10/11/15 0001    Lumbar Exercises: Seated   Long Arc Quad on Chair 2 sets;15 reps;Both   Other Seated Lumbar Exercises OHP yellow ball 2x10, Rows blue Tband 2x15    Other Seated Lumbar Exercises Seated march #3 2x10    Knee/Hip Exercises: Aerobic   Nustep L4 x70min    Knee/Hip Exercises: Machines for Strengthening   Cybex Knee Extension 5# 2x10   Cybex Knee Flexion 20# 2x10   Cybex Leg Press #20 2x10; no weight LLE only #20 2x10                   PT Short Term Goals - 10/04/15 1557    PT SHORT TERM GOAL #1   Title understand fall risks in the home   Time 2   Period Weeks   Status New           PT Long Term Goals - 10/04/15 1557    PT LONG TERM GOAL #1   Title demonstrate safe transfers in the clinic   Time 8   Period Weeks   Status New   PT LONG TERM GOAL #2   Title decrease timed up and go test time to 20 seconds   Time 8   Period Weeks   Status New   PT LONG TERM GOAL #3   Title increase berg balance test score to 40/56   Time 8   Period Weeks   Status New   PT LONG TERM GOAL #4   Title no falls over a 4 week period   Time 8   Period Weeks   Status New   PT LONG TERM GOAL #5   Title increase LE strength to 4/5   Time 8  Period Weeks   Status New               Plan - 10/11/15 1521    Clinical Impression Statement Pt with another mild progression with machine level interventions. Pt was able to walk from one machine to another without AD. Pt does fatigue with exercises, but tolerates them well.    Pt will benefit from skilled therapeutic intervention in order to improve on the following deficits Abnormal gait;Cardiopulmonary status limiting activity;Decreased activity tolerance;Decreased balance;Decreased mobility;Decreased range of motion;Decreased endurance;Decreased safety awareness;Difficulty walking   Rehab Potential Good   PT Frequency 2x / week   PT Duration 8 weeks   PT Treatment/Interventions ADLs/Self Care Home Management;Gait training;Stair training;Functional mobility training;Therapeutic activities;Therapeutic exercise;Manual techniques;Patient/family education;Balance training   PT Next Visit Plan continue to slowly add exercises and balance, continue with safety instructions        Problem List Patient Active Problem List   Diagnosis Date Noted  . 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 HLD (hyperlipidemia)   . Acute CVA (cerebrovascular  accident) ([place])   . Lacunar infarct, acute ([place]) 03/02/2015  . Diabetic neuropathy ([place]) 03/02/2015  . Stroke ([place]) 11/28/2014  . CVA (cerebral infarction) 11/28/2014  . Diabetes mellitus ([place]) 11/28/2014  . Chronic kidney disease (CKD) stage G2/A1, mildly decreased glomerular filtration rate (GFR) between 60-89 mL/min/1.73 square meter and albuminuria creatinine ratio less than 30 mg/g 11/28/2014  . Neuropathy in diabetes ([place]) 11/28/2014  . Toe fracture, left 11/28/2014  . DM (diabetes mellitus) type II controlled with renal manifestation ([place]) 11/28/2014  . Essential hypertension 05/27/2014  . Hyperlipidemia 05/27/2014  . Aortic stenosis 05/27/2014  . Abnormal electrocardiogram 05/27/2014  . Chest pain 05/27/2014    Scot Jun, PTA  10/11/2015, 3:28 PM  [place], [place], [address] Phone: [phone]   Fax:  [phone]  Name: Kristopher Zimmerman MRN: [id number] Date of Birth: September 14, 1923

## 2015-10-13 ENCOUNTER — Encounter: Payer: Self-pay | Admitting: Physical Therapy

## 2015-10-13 ENCOUNTER — Ambulatory Visit: Payer: Medicare Other | Admitting: Physical Therapy

## 2015-10-13 DIAGNOSIS — R531 Weakness: Secondary | ICD-10-CM

## 2015-10-13 DIAGNOSIS — R5381 Other malaise: Secondary | ICD-10-CM | POA: Diagnosis not present

## 2015-10-13 DIAGNOSIS — W19XXXD Unspecified fall, subsequent encounter: Secondary | ICD-10-CM

## 2015-10-13 DIAGNOSIS — I6990 Unspecified sequelae of unspecified cerebrovascular disease: Secondary | ICD-10-CM

## 2015-10-13 DIAGNOSIS — R262 Difficulty in walking, not elsewhere classified: Secondary | ICD-10-CM | POA: Diagnosis not present

## 2015-10-13 NOTE — Therapy (Signed)
[place], [place], [address] Phone: [phone]   Fax:  [phone]  Physical Therapy Treatment  Patient Details  Name: Kristopher Zimmerman MRN: [id number] Date of Birth: 1924/01/21 Referring Provider: Dr. Ashby Dawes  Encounter Date: 10/13/2015      PT End of Session - 10/13/15 1516    Visit Number 4   Date for PT Re-Evaluation 12/04/15   PT Start Time 1430   PT Stop Time 1516   PT Time Calculation (min) 46 min      Past Medical History  Diagnosis Date  . Diabetes mellitus without complication ([place])   . Neuropathy, diabetic ([place])   . Hypertension   . Renal insufficiency   . Stroke ([place])   . Macular degeneration of right eye   . Neuropathy [facility])     Past Surgical History  Procedure Laterality Date  . Back surgery    . Broken leg    . Cervical disc surgery    . Tonsillectomy    . Appendectomy      There were no vitals filed for this visit.  Visit Diagnosis:  Late effects of CVA (cerebrovascular accident)  Falls, subsequent encounter  Debility  Weakness  Difficulty walking      Subjective Assessment - 10/13/15 1432    Subjective "Im doing all right"   Currently in Pain? No/denies   Multiple Pain Sites No            [facility] PT Assessment - 10/13/15 0001    Berg Balance Test   Sit to Stand Able to stand using hands after several tries   Standing Unsupported Able to stand safely 2 minutes   Sitting with Back Unsupported but Feet Supported on Floor or Stool Able to sit safely and securely 2 minutes   Stand to Sit Controls descent by using hands   Transfers Able to transfer safely, minor use of hands  Uses rollator   Standing Unsupported with Eyes Closed Able to stand 10 seconds with supervision   Standing Ubsupported with Feet Together Able to place feet together independently but unable to hold for 30 seconds  Able to place geels together   From Standing, Reach  Forward with Outstretched Arm Can reach forward >12 cm safely (5")   From Standing Position, Pick up Object from Floor Able to pick up shoe, needs supervision   From Standing Position, Turn to Look Behind Over each Shoulder Turn sideways only but maintains balance   Turn 360 Degrees Able to turn 360 degrees safely but slowly   Standing Unsupported, Alternately Place Feet on Step/Stool Able to complete >2 steps/needs minimal assist   Standing Unsupported, One Foot in [employer] balance while stepping or standing   Standing on One Leg Tries to lift leg/unable to hold 3 seconds but remains standing independently   Total Score 34   Timed Up and Go Test   Normal TUG (seconds) 16.79                     [facility] Adult PT Treatment/Exercise - 10/13/15 0001    Lumbar Exercises: Seated   Other Seated Lumbar Exercises OHP blue ball 2x15, Rows blue Tband 2x15    Knee/Hip Exercises: Aerobic   Nustep L4 x83min    Knee/Hip Exercises: Machines for Strengthening   Cybex Knee Flexion 25# 2x156                  PT  Short Term Goals - 10/04/15 1557    PT SHORT TERM GOAL #1   Title understand fall risks in the home   Time 2   Period Weeks   Status New           PT Long Term Goals - 10/13/15 1516    PT LONG TERM GOAL #2   Title decrease timed up and go test time to 20 seconds   Status Achieved   PT LONG TERM GOAL #3   Title increase berg balance test score to 40/56   Status On-going               Plan - 10/13/15 1517    Clinical Impression Statement Pt shows progression this day decreasing TUG time and well as increasing BURG balance score. Tolerated everything well, no subjective c/o increase pain. Continues to fatigue with activity.   Pt will benefit from skilled therapeutic intervention in order to improve on the following deficits Abnormal gait;Cardiopulmonary status limiting activity;Decreased activity tolerance;Decreased balance;Decreased mobility;Decreased  range of motion;Decreased endurance;Decreased safety awareness;Difficulty walking   Rehab Potential Good   PT Frequency 2x / week   PT Duration 8 weeks   PT Treatment/Interventions ADLs/Self Care Home Management;Gait training;Stair training;Functional mobility training;Therapeutic activities;Therapeutic exercise;Manual techniques;Patient/family education;Balance training   PT Next Visit Plan continue to slowly add exercises and balance, continue with safety instructions        Problem List Patient Active Problem List   Diagnosis Date Noted  . 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 HLD (hyperlipidemia)   . Acute CVA (cerebrovascular accident) ([place])   . Lacunar infarct, acute ([place]) 03/02/2015  . Diabetic neuropathy ([place]) 03/02/2015  . Stroke ([place]) 11/28/2014  . CVA (cerebral infarction) 11/28/2014  . Diabetes mellitus ([place]) 11/28/2014  . Chronic kidney disease (CKD) stage G2/A1, mildly decreased glomerular filtration rate (GFR) between 60-89 mL/min/1.73 square meter and albuminuria creatinine ratio less than 30 mg/g 11/28/2014  . Neuropathy in diabetes ([place]) 11/28/2014  . Toe fracture, left 11/28/2014  . DM (diabetes mellitus) type II controlled with renal manifestation ([place]) 11/28/2014  . Essential hypertension 05/27/2014  . Hyperlipidemia 05/27/2014  . Aortic stenosis 05/27/2014  . Abnormal electrocardiogram 05/27/2014  . Chest pain 05/27/2014    Scot Jun, PTA  10/13/2015, 3:19 PM  [place], [place], [address] Phone: [phone]   Fax:  [phone]  Name: Kristopher Zimmerman MRN: [id number] Date of Birth: Apr 10, 1924

## 2015-10-18 ENCOUNTER — Ambulatory Visit: Payer: Medicare Other | Admitting: Physical Therapy

## 2015-10-18 ENCOUNTER — Encounter: Payer: Self-pay | Admitting: Physical Therapy

## 2015-10-18 DIAGNOSIS — R531 Weakness: Secondary | ICD-10-CM | POA: Diagnosis not present

## 2015-10-18 DIAGNOSIS — I6990 Unspecified sequelae of unspecified cerebrovascular disease: Secondary | ICD-10-CM

## 2015-10-18 DIAGNOSIS — R262 Difficulty in walking, not elsewhere classified: Secondary | ICD-10-CM

## 2015-10-18 DIAGNOSIS — W19XXXD Unspecified fall, subsequent encounter: Secondary | ICD-10-CM

## 2015-10-18 DIAGNOSIS — R5381 Other malaise: Secondary | ICD-10-CM | POA: Diagnosis not present

## 2015-10-18 NOTE — Therapy (Signed)
[place], [place], [address] Phone: [phone]   Fax:  [phone]  Physical Therapy Treatment  Patient Details  Name: Kristopher Zimmerman MRN: [id number] Date of Birth: Sep 30, 1923 Referring Provider: Dr. Ashby Dawes  Encounter Date: 10/18/2015      PT End of Session - 10/18/15 1513    Visit Number 5   Date for PT Re-Evaluation 12/04/15   PT Start Time 1429   PT Stop Time 1513   PT Time Calculation (min) 44 min   Activity Tolerance Patient tolerated treatment well   Behavior During Therapy [facility] for tasks assessed/performed      Past Medical History  Diagnosis Date  . Diabetes mellitus without complication ([place])   . Neuropathy, diabetic ([place])   . Hypertension   . Renal insufficiency   . Stroke ([place])   . Macular degeneration of right eye   . Neuropathy [facility])     Past Surgical History  Procedure Laterality Date  . Back surgery    . Broken leg    . Cervical disc surgery    . Tonsillectomy    . Appendectomy      There were no vitals filed for this visit.  Visit Diagnosis:  Late effects of CVA (cerebrovascular accident)  Debility  Weakness  Difficulty walking  Falls, subsequent encounter      Subjective Assessment - 10/18/15 1428    Subjective "All right"   Currently in Pain? No/denies   Multiple Pain Sites No                         [facility] Adult PT Treatment/Exercise - 10/18/15 0001    Lumbar Exercises: Seated   Other Seated Lumbar Exercises OHP blue ball 2x15, Rows blue Tband 2x15    Knee/Hip Exercises: Aerobic   Nustep L4 x 7 min    Knee/Hip Exercises: Machines for Strengthening   Cybex Knee Extension #10 2x10; #5 RLE only x10   Cybex Knee Flexion 25# 2x15   Cybex Leg Press #20 2x15; no weight LLE only #20 2x10   Knee/Hip Exercises: Standing   Other Standing Knee Exercises Standing march #2 2x10  with Rolator    Other Standing Knee Exercises  Standing toe taps 6in box #2 x10 each   Knee/Hip Exercises: Seated   Other Seated Knee/Hip Exercises 2x10   Marching Weights 2 lbs.                  PT Short Term Goals - 10/04/15 1557    PT SHORT TERM GOAL #1   Title understand fall risks in the home   Time 2   Period Weeks   Status New           PT Long Term Goals - 10/13/15 1516    PT LONG TERM GOAL #2   Title decrease timed up and go test time to 20 seconds   Status Achieved   PT LONG TERM GOAL #3   Title increase berg balance test score to 40/56   Status On-going               Plan - 10/18/15 1514    Clinical Impression Statement Pt demo ed good LE strength able to tolerate some machine level interventions with LLE only, easily fatigues with exercises. Pt able to progress to additional standing interventions with ankle weights and  UE assist with rolator.   Pt will benefit from  skilled therapeutic intervention in order to improve on the following deficits Abnormal gait;Cardiopulmonary status limiting activity;Decreased activity tolerance;Decreased balance;Decreased mobility;Decreased range of motion;Decreased endurance;Decreased safety awareness;Difficulty walking   Rehab Potential Good   PT Frequency 2x / week   PT Duration 8 weeks   PT Treatment/Interventions ADLs/Self Care Home Management;Gait training;Stair training;Functional mobility training;Therapeutic activities;Therapeutic exercise;Manual techniques;Patient/family education;Balance training   PT Next Visit Plan continue to slowly add exercises and balance, continue with safety instructions        Problem List Patient Active Problem List   Diagnosis Date Noted  . 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 HLD (hyperlipidemia)   . Acute CVA (cerebrovascular accident) ([place])   . Lacunar infarct, acute ([place]) 03/02/2015  . Diabetic neuropathy ([place]) 03/02/2015  . Stroke ([place]) 11/28/2014  . CVA (cerebral infarction) 11/28/2014  .  Diabetes mellitus ([place]) 11/28/2014  . Chronic kidney disease (CKD) stage G2/A1, mildly decreased glomerular filtration rate (GFR) between 60-89 mL/min/1.73 square meter and albuminuria creatinine ratio less than 30 mg/g 11/28/2014  . Neuropathy in diabetes ([place]) 11/28/2014  . Toe fracture, left 11/28/2014  . DM (diabetes mellitus) type II controlled with renal manifestation ([place]) 11/28/2014  . Essential hypertension 05/27/2014  . Hyperlipidemia 05/27/2014  . Aortic stenosis 05/27/2014  . Abnormal electrocardiogram 05/27/2014  . Chest pain 05/27/2014    Scot Jun, PTA  10/18/2015, 3:16 PM  [place] [address] [place], [place], [address] Phone: [phone]   Fax:  [phone]  Name: Osualdo Tavis MRN: [id number] Date of Birth: 25-Nov-1923

## 2015-10-20 ENCOUNTER — Encounter: Payer: Self-pay | Admitting: Physical Therapy

## 2015-10-20 ENCOUNTER — Ambulatory Visit: Payer: Medicare Other | Admitting: Physical Therapy

## 2015-10-20 DIAGNOSIS — R531 Weakness: Secondary | ICD-10-CM

## 2015-10-20 DIAGNOSIS — R5381 Other malaise: Secondary | ICD-10-CM | POA: Diagnosis not present

## 2015-10-20 DIAGNOSIS — R262 Difficulty in walking, not elsewhere classified: Secondary | ICD-10-CM

## 2015-10-20 DIAGNOSIS — W19XXXD Unspecified fall, subsequent encounter: Secondary | ICD-10-CM

## 2015-10-20 DIAGNOSIS — I6990 Unspecified sequelae of unspecified cerebrovascular disease: Secondary | ICD-10-CM | POA: Diagnosis not present

## 2015-10-20 NOTE — Therapy (Signed)
[place], [place], [address] Phone: [phone]   Fax:  [phone]  Physical Therapy Treatment  Patient Details  Name: Kristopher Zimmerman MRN: [id number] Date of Birth: 09/25/1923 Referring Provider: Dr. Ashby Dawes  Encounter Date: 10/20/2015      PT End of Session - 10/20/15 1510    Visit Number 6   Date for PT Re-Evaluation 12/04/15   PT Start Time 1430   PT Stop Time 1512   PT Time Calculation (min) 42 min   Activity Tolerance Patient tolerated treatment well   Behavior During Therapy [facility] for tasks assessed/performed      Past Medical History  Diagnosis Date  . Diabetes mellitus without complication ([place])   . Neuropathy, diabetic ([place])   . Hypertension   . Renal insufficiency   . Stroke ([place])   . Macular degeneration of right eye   . Neuropathy [facility])     Past Surgical History  Procedure Laterality Date  . Back surgery    . Broken leg    . Cervical disc surgery    . Tonsillectomy    . Appendectomy      There were no vitals filed for this visit.  Visit Diagnosis:  Late effects of CVA (cerebrovascular accident)  Debility  Weakness  Difficulty walking  Falls, subsequent encounter      Subjective Assessment - 10/20/15 1437    Subjective "About the same"   Currently in Pain? No/denies   Multiple Pain Sites No                         [facility] Adult PT Treatment/Exercise - 10/20/15 0001    High Level Balance   High Level Balance Activities Side stepping;Backward walking  therapist assist.   Lumbar Exercises: Standing   Other Standing Lumbar Exercises Standing with alternating arm reaches   Other Standing Lumbar Exercises Standing chest press with red ball x10   Lumbar Exercises: Seated   Other Seated Lumbar Exercises OHP #5 2x10; Rows black Tband 2x15    Knee/Hip Exercises: Aerobic   Nustep L4 x 7 min    Knee/Hip Exercises: Standing   Other  Standing Knee Exercises Standing toe taps 6in box #2 x10 each; LAteral box steps #2 2x15   Knee/Hip Exercises: Seated   Sit to Sand 2 sets;10 reps;with UE support                  PT Short Term Goals - 10/04/15 1557    PT SHORT TERM GOAL #1   Title understand fall risks in the home   Time 2   Period Weeks   Status New           PT Long Term Goals - 10/13/15 1516    PT LONG TERM GOAL #2   Title decrease timed up and go test time to 20 seconds   Status Achieved   PT LONG TERM GOAL #3   Title increase berg balance test score to 40/56   Status On-going               Plan - 10/20/15 1510    Clinical Impression Statement Pt with a decrease activity tolerance this date. Majority of treatment consisted of standing interventions. Pt demos more confidence with sit to stand. Constant cues to push from mat when transferring.    Pt will benefit from skilled therapeutic intervention in order to improve on the  following deficits Abnormal gait;Cardiopulmonary status limiting activity;Decreased activity tolerance;Decreased balance;Decreased mobility;Decreased range of motion;Decreased endurance;Decreased safety awareness;Difficulty walking   Rehab Potential Good   PT Frequency 2x / week   PT Duration 8 weeks   PT Treatment/Interventions ADLs/Self Care Home Management;Gait training;Stair training;Functional mobility training;Therapeutic activities;Therapeutic exercise;Manual techniques;Patient/family education;Balance training   PT Next Visit Plan continue to slowly add exercises and balance, continue with safety instructions        Problem List Patient Active Problem List   Diagnosis Date Noted  . 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 HLD (hyperlipidemia)   . Acute CVA (cerebrovascular accident) ([place])   . Lacunar infarct, acute ([place]) 03/02/2015  . Diabetic neuropathy ([place]) 03/02/2015  . Stroke ([place]) 11/28/2014  . CVA (cerebral infarction) 11/28/2014  .  Diabetes mellitus ([place]) 11/28/2014  . Chronic kidney disease (CKD) stage G2/A1, mildly decreased glomerular filtration rate (GFR) between 60-89 mL/min/1.73 square meter and albuminuria creatinine ratio less than 30 mg/g 11/28/2014  . Neuropathy in diabetes ([place]) 11/28/2014  . Toe fracture, left 11/28/2014  . DM (diabetes mellitus) type II controlled with renal manifestation ([place]) 11/28/2014  . Essential hypertension 05/27/2014  . Hyperlipidemia 05/27/2014  . Aortic stenosis 05/27/2014  . Abnormal electrocardiogram 05/27/2014  . Chest pain 05/27/2014    Scot Jun, PTA  10/20/2015, 3:12 PM  [place] [address] [place], [place], [address] Phone: [phone]   Fax:  [phone]  Name: Kristopher Zimmerman MRN: [id number] Date of Birth: September 03, 1923

## 2015-10-25 ENCOUNTER — Encounter: Payer: Self-pay | Admitting: Physical Therapy

## 2015-10-25 ENCOUNTER — Ambulatory Visit: Payer: Medicare Other | Admitting: Physical Therapy

## 2015-10-25 DIAGNOSIS — W19XXXD Unspecified fall, subsequent encounter: Secondary | ICD-10-CM

## 2015-10-25 DIAGNOSIS — R5381 Other malaise: Secondary | ICD-10-CM

## 2015-10-25 DIAGNOSIS — R531 Weakness: Secondary | ICD-10-CM | POA: Diagnosis not present

## 2015-10-25 DIAGNOSIS — R262 Difficulty in walking, not elsewhere classified: Secondary | ICD-10-CM | POA: Diagnosis not present

## 2015-10-25 DIAGNOSIS — I6990 Unspecified sequelae of unspecified cerebrovascular disease: Secondary | ICD-10-CM | POA: Diagnosis not present

## 2015-10-25 NOTE — Therapy (Signed)
[place], [place], [address] Phone: [phone]   Fax:  [phone]  Physical Therapy Treatment  Patient Details  Name: Kristopher Zimmerman MRN: [id number] Date of Birth: Jan 17, 1924 Referring Provider: Dr. Ashby Dawes  Encounter Date: 10/25/2015      PT End of Session - 10/25/15 1505    Visit Number 7   Date for PT Re-Evaluation 12/04/15   PT Start Time 1430   PT Stop Time 1505   PT Time Calculation (min) 35 min   Activity Tolerance Patient limited by fatigue   Behavior During Therapy [facility] for tasks assessed/performed      Past Medical History  Diagnosis Date  . Diabetes mellitus without complication ([place])   . Neuropathy, diabetic ([place])   . Hypertension   . Renal insufficiency   . Stroke ([place])   . Macular degeneration of right eye   . Neuropathy [facility])     Past Surgical History  Procedure Laterality Date  . Back surgery    . Broken leg    . Cervical disc surgery    . Tonsillectomy    . Appendectomy      There were no vitals filed for this visit.  Visit Diagnosis:  Late effects of CVA (cerebrovascular accident)  Debility  Weakness  Difficulty walking  Falls, subsequent encounter      Subjective Assessment - 10/25/15 1434    Subjective "Im doing good"   Currently in Pain? No/denies   Multiple Pain Sites No                         [facility] Adult PT Treatment/Exercise - 10/25/15 0001    Lumbar Exercises: Aerobic   UBE (Upper Arm Bike) L1 x3 min    Knee/Hip Exercises: Aerobic   Nustep L4 x 7 min    Knee/Hip Exercises: Machines for Strengthening   Cybex Knee Extension #15 2x10   Cybex Knee Flexion 35# 2x10   Cybex Leg Press #40 2x15; LLE only #20 2x10                  PT Short Term Goals - 10/04/15 1557    PT SHORT TERM GOAL #1   Title understand fall risks in the home   Time 2   Period Weeks   Status New           PT Long Term  Goals - 10/13/15 1516    PT LONG TERM GOAL #2   Title decrease timed up and go test time to 20 seconds   Status Achieved   PT LONG TERM GOAL #3   Title increase berg balance test score to 40/56   Status On-going               Plan - 10/25/15 1506    Clinical Impression Statement Shortened session due to Pt decrease activity tolerance. Completed all interventions well with increase weight. UBE performed for additional endurance.   Pt will benefit from skilled therapeutic intervention in order to improve on the following deficits Abnormal gait;Cardiopulmonary status limiting activity;Decreased activity tolerance;Decreased balance;Decreased mobility;Decreased range of motion;Decreased endurance;Decreased safety awareness;Difficulty walking   Rehab Potential Good   PT Frequency 2x / week   PT Duration 8 weeks   PT Treatment/Interventions ADLs/Self Care Home Management;Gait training;Stair training;Functional mobility training;Therapeutic activities;Therapeutic exercise;Manual techniques;Patient/family education;Balance training   PT Next Visit Plan add exercises and balance, continue with safety instructions  Problem List Patient Active Problem List   Diagnosis Date Noted  . 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 HLD (hyperlipidemia)   . Acute CVA (cerebrovascular accident) ([place])   . Lacunar infarct, acute ([place]) 03/02/2015  . Diabetic neuropathy ([place]) 03/02/2015  . Stroke ([place]) 11/28/2014  . CVA (cerebral infarction) 11/28/2014  . Diabetes mellitus ([place]) 11/28/2014  . Chronic kidney disease (CKD) stage G2/A1, mildly decreased glomerular filtration rate (GFR) between 60-89 mL/min/1.73 square meter and albuminuria creatinine ratio less than 30 mg/g 11/28/2014  . Neuropathy in diabetes ([place]) 11/28/2014  . Toe fracture, left 11/28/2014  . DM (diabetes mellitus) type II controlled with renal manifestation ([place]) 11/28/2014  . Essential hypertension 05/27/2014  .  Hyperlipidemia 05/27/2014  . Aortic stenosis 05/27/2014  . Abnormal electrocardiogram 05/27/2014  . Chest pain 05/27/2014    Scot Jun, PTA  10/25/2015, 3:08 PM  [place] [address] [place], [place], [address] Phone: [phone]   Fax:  [phone]  Name: Kristopher Zimmerman MRN: [id number] Date of Birth: [birth year]

## 2015-10-27 ENCOUNTER — Encounter: Payer: Self-pay | Admitting: Physical Therapy

## 2015-10-27 ENCOUNTER — Ambulatory Visit: Payer: Medicare Other | Admitting: Physical Therapy

## 2015-10-27 DIAGNOSIS — I6990 Unspecified sequelae of unspecified cerebrovascular disease: Secondary | ICD-10-CM

## 2015-10-27 DIAGNOSIS — R262 Difficulty in walking, not elsewhere classified: Secondary | ICD-10-CM

## 2015-10-27 DIAGNOSIS — R5381 Other malaise: Secondary | ICD-10-CM

## 2015-10-27 DIAGNOSIS — R531 Weakness: Secondary | ICD-10-CM

## 2015-10-27 DIAGNOSIS — W19XXXD Unspecified fall, subsequent encounter: Secondary | ICD-10-CM

## 2015-10-27 NOTE — Therapy (Signed)
[place], [place], [address] Phone: [phone]   Fax:  [phone]  Physical Therapy Treatment  Patient Details  Name: Kristopher Zimmerman MRN: [id number] Date of Birth: Apr 13, 1924 Referring Provider: Dr. Ashby Dawes  Encounter Date: 10/27/2015      PT End of Session - 10/27/15 1510    Visit Number 8   Date for PT Re-Evaluation 12/04/15   PT Start Time 1430   PT Stop Time 1510   PT Time Calculation (min) 40 min   Activity Tolerance Patient limited by fatigue;Patient tolerated treatment well   Behavior During Therapy [facility] for tasks assessed/performed      Past Medical History  Diagnosis Date  . Diabetes mellitus without complication ([place])   . Neuropathy, diabetic ([place])   . Hypertension   . Renal insufficiency   . Stroke ([place])   . Macular degeneration of right eye   . Neuropathy [facility])     Past Surgical History  Procedure Laterality Date  . Back surgery    . Broken leg    . Cervical disc surgery    . Tonsillectomy    . Appendectomy      There were no vitals filed for this visit.  Visit Diagnosis:  Late effects of CVA (cerebrovascular accident)  Debility  Weakness  Difficulty walking  Falls, subsequent encounter      Subjective Assessment - 10/27/15 1429    Subjective "I had a bad couple of days, I was sore all over the last time I left here"   Currently in Pain? Yes   Pain Score 5    Pain Location Leg   Pain Orientation Left;Right   Pain Descriptors / Indicators Sore  And weakness            [facility] PT Assessment - 10/27/15 0001    Strength   Overall Strength Comments hips 4/5, knees 5/5, shoulder strength was 4+/5                     [facility] Adult PT Treatment/Exercise - 10/27/15 0001    Lumbar Exercises: Seated   Other Seated Lumbar Exercises OHP #5 2x10; Rows black Tband 2x15    Other Seated Lumbar Exercises Blue Tband halos 2x10    Knee/Hip  Exercises: Aerobic   Nustep L3 x 8 min    Knee/Hip Exercises: Machines for Strengthening   Cybex Knee Extension #10 2x15   Cybex Knee Flexion 35# 2x10   Knee/Hip Exercises: Seated   Sit to Sand 2 sets;5 reps;with UE support  little assist from therapist                   PT Short Term Goals - 10/27/15 1447    PT SHORT TERM GOAL #1   Title understand fall risks in the home   Status Achieved           PT Long Term Goals - 10/27/15 1446    PT LONG TERM GOAL #1   Title demonstrate safe transfers in the clinic   Status Achieved   PT LONG TERM GOAL #4   Title no falls over a 4 week period   Status On-going   PT LONG TERM GOAL #5   Title increase LE strength to 4/5   Status Achieved               Plan - 10/27/15 1510    Clinical Impression Statement Pre treatment pt stated "  Can we take it easy today". Pt tolerated treatment session well with a decrease intensity. Pt demos signs of fatigue with band halos establishing core strength. Some weakness and instability with sit to sand interventions    Pt will benefit from skilled therapeutic intervention in order to improve on the following deficits Abnormal gait;Cardiopulmonary status limiting activity;Decreased activity tolerance;Decreased balance;Decreased mobility;Decreased range of motion;Decreased endurance;Decreased safety awareness;Difficulty walking   Rehab Potential Good   PT Frequency 2x / week   PT Duration 8 weeks   PT Treatment/Interventions ADLs/Self Care Home Management;Gait training;Stair training;Functional mobility training;Therapeutic activities;Therapeutic exercise;Manual techniques;Patient/family education;Balance training   PT Next Visit Plan add exercises and balance, continue with safety instructions        Problem List Patient Active Problem List   Diagnosis Date Noted  . 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 HLD (hyperlipidemia)   . Acute CVA (cerebrovascular accident) ([place])    . Lacunar infarct, acute ([place]) 03/02/2015  . Diabetic neuropathy ([place]) 03/02/2015  . Stroke ([place]) 11/28/2014  . CVA (cerebral infarction) 11/28/2014  . Diabetes mellitus ([place]) 11/28/2014  . Chronic kidney disease (CKD) stage G2/A1, mildly decreased glomerular filtration rate (GFR) between 60-89 mL/min/1.73 square meter and albuminuria creatinine ratio less than 30 mg/g 11/28/2014  . Neuropathy in diabetes ([place]) 11/28/2014  . Toe fracture, left 11/28/2014  . DM (diabetes mellitus) type II controlled with renal manifestation ([place]) 11/28/2014  . Essential hypertension 05/27/2014  . Hyperlipidemia 05/27/2014  . Aortic stenosis 05/27/2014  . Abnormal electrocardiogram 05/27/2014  . Chest pain 05/27/2014    Scot Jun, PTA  10/27/2015, 3:13 PM  [place], [place], [address] Phone: [phone]   Fax:  [phone]  Name: Kristopher Zimmerman MRN: [id number] Date of Birth: 01-13-1924

## 2015-11-01 ENCOUNTER — Encounter: Payer: Self-pay | Admitting: Physical Therapy

## 2015-11-01 ENCOUNTER — Ambulatory Visit: Payer: Medicare Other | Attending: Internal Medicine | Admitting: Physical Therapy

## 2015-11-01 DIAGNOSIS — I6990 Unspecified sequelae of unspecified cerebrovascular disease: Secondary | ICD-10-CM | POA: Insufficient documentation

## 2015-11-01 DIAGNOSIS — W19XXXD Unspecified fall, subsequent encounter: Secondary | ICD-10-CM | POA: Insufficient documentation

## 2015-11-01 DIAGNOSIS — M6281 Muscle weakness (generalized): Secondary | ICD-10-CM | POA: Diagnosis not present

## 2015-11-01 DIAGNOSIS — Z9181 History of falling: Secondary | ICD-10-CM | POA: Insufficient documentation

## 2015-11-01 DIAGNOSIS — R296 Repeated falls: Secondary | ICD-10-CM | POA: Insufficient documentation

## 2015-11-01 DIAGNOSIS — R531 Weakness: Secondary | ICD-10-CM | POA: Diagnosis not present

## 2015-11-01 DIAGNOSIS — R262 Difficulty in walking, not elsewhere classified: Secondary | ICD-10-CM | POA: Insufficient documentation

## 2015-11-01 DIAGNOSIS — R5381 Other malaise: Secondary | ICD-10-CM | POA: Insufficient documentation

## 2015-11-01 NOTE — Therapy (Signed)
[place], [place], [address] Phone: [phone]   Fax:  [phone]  Physical Therapy Treatment  Patient Details  Name: Kristopher Zimmerman MRN: [id number] Date of Birth: 15-Jul-1924 Referring Provider: Dr. Ashby Dawes  Encounter Date: 11/01/2015      PT End of Session - 11/01/15 1509    Visit Number 9   Date for PT Re-Evaluation 12/04/15   PT Start Time 1429   PT Stop Time 1510   PT Time Calculation (min) 41 min   Activity Tolerance Patient limited by fatigue;Patient tolerated treatment well   Behavior During Therapy [facility] for tasks assessed/performed      Past Medical History  Diagnosis Date  . Diabetes mellitus without complication ([place])   . Neuropathy, diabetic ([place])   . Hypertension   . Renal insufficiency   . Stroke ([place])   . Macular degeneration of right eye   . Neuropathy [facility])     Past Surgical History  Procedure Laterality Date  . Back surgery    . Broken leg    . Cervical disc surgery    . Tonsillectomy    . Appendectomy      There were no vitals filed for this visit.  Visit Diagnosis:  Late effects of CVA (cerebrovascular accident)  Debility  Difficulty walking  Weakness  Falls, subsequent encounter      Subjective Assessment - 11/01/15 1431    Subjective "Ive had my good days and my bad days"   Currently in Pain? Yes   Pain Score 3    Pain Location Leg   Pain Orientation Right;Left            [facility] PT Assessment - 11/01/15 0001    Berg Balance Test   Sit to Stand Able to stand without using hands and stabilize independently   Standing Unsupported Able to stand safely 2 minutes   Sitting with Back Unsupported but Feet Supported on Floor or Stool Able to sit safely and securely 2 minutes   Stand to Sit Sits safely with minimal use of hands   Transfers Able to transfer safely, minor use of hands  rolator    Standing Unsupported with Eyes Closed Able to  stand 10 seconds with supervision   Standing Ubsupported with Feet Together Able to place feet together independently but unable to hold for 30 seconds   From Standing, Reach Forward with Outstretched Arm Can reach forward >12 cm safely (5")   From Standing Position, Pick up Object from Floor Able to pick up shoe safely and easily   From Standing Position, Turn to Look Behind Over each Shoulder Turn sideways only but maintains balance   Turn 360 Degrees Able to turn 360 degrees safely but slowly   Standing Unsupported, Alternately Place Feet on Step/Stool Needs assistance to keep from falling or unable to try   Standing Unsupported, One Foot in [employer] balance while stepping or standing   Standing on One Leg Unable to try or needs assist to prevent fall   Total Score 36   Timed Up and Go Test   TUG Normal TUG   Normal TUG (seconds) 14  rolator                     [facility] Adult PT Treatment/Exercise - 11/01/15 0001    Ambulation/Gait   Ambulation/Gait Yes   Ambulation/Gait Assistance 4: Min guard;5: Supervision   Ambulation Distance (Feet) 20 Feet  Assistive device None   Gait Pattern Step-through pattern;Decreased arm swing - right;Decreased arm swing - left;Decreased step length - right;Decreased step length - left;Decreased hip/knee flexion - right;Decreased hip/knee flexion - left   Ambulation Surface Level;Indoor   Gait Comments slow, shuffling gait, small steps   Knee/Hip Exercises: Aerobic   Nustep L3 x 8 min    Knee/Hip Exercises: Machines for Strengthening   Cybex Leg Press #60 2x10; LLE only #20 2x10                  PT Short Term Goals - 10/27/15 1447    PT SHORT TERM GOAL #1   Title understand fall risks in the home   Status Achieved           PT Long Term Goals - 11/01/15 1501    PT LONG TERM GOAL #1   Title demonstrate safe transfers in the clinic   Status Achieved   PT LONG TERM GOAL #2   Title decrease timed up and go test time  to 20 seconds   Status Achieved   PT LONG TERM GOAL #3   Title increase berg balance test score to 40/56   PT LONG TERM GOAL #4   Title no falls over a 4 week period   Status On-going               Plan - 11/01/15 1510    Clinical Impression Statement Pt with improved ability to transfer from sit to stand without Rolator. Progressed to som gait trials without AD. Mild increase in BERG balance score as well as decrease TUG time.  Fatigues easily with activity.   Pt will benefit from skilled therapeutic intervention in order to improve on the following deficits Abnormal gait;Cardiopulmonary status limiting activity;Decreased activity tolerance;Decreased balance;Decreased mobility;Decreased range of motion;Decreased endurance;Decreased safety awareness;Difficulty walking   Rehab Potential Good   PT Frequency 2 / week   PT Duration 8 weeks   PT Treatment/Interventions ADLs/Self Care Home Management;Gait training;Stair training;Functional mobility training;Therapeutic activities;Therapeutic exercise;Manual techniques;Patient/family education;Balance training   PT Next Visit Plan Balance and LE strength        Problem List Patient Active Problem List   Diagnosis Date Noted  . 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 HLD (hyperlipidemia)   . Acute CVA (cerebrovascular accident) ([place])   . Lacunar infarct, acute ([place]) 03/02/2015  . Diabetic neuropathy ([place]) 03/02/2015  . Stroke ([place]) 11/28/2014  . CVA (cerebral infarction) 11/28/2014  . Diabetes mellitus ([place]) 11/28/2014  . Chronic kidney disease (CKD) stage G2/A1, mildly decreased glomerular filtration rate (GFR) between 60-89 mL/min/1.73 square meter and albuminuria creatinine ratio less than 30 mg/g 11/28/2014  . Neuropathy in diabetes ([place]) 11/28/2014  . Toe fracture, left 11/28/2014  . DM (diabetes mellitus) type II controlled with renal manifestation ([place]) 11/28/2014  . Essential hypertension 05/27/2014  .  Hyperlipidemia 05/27/2014  . Aortic stenosis 05/27/2014  . Abnormal electrocardiogram 05/27/2014  . Chest pain 05/27/2014    Scot Jun, PTA  11/01/2015, 3:14 PM  [address] [address] [place], [place], [address] Phone: [phone]   Fax:  [phone]  Name: Kristopher Zimmerman MRN: [id number] Date of Birth: 1924-05-07

## 2015-11-03 ENCOUNTER — Ambulatory Visit: Payer: Medicare Other | Admitting: Physical Therapy

## 2015-11-04 ENCOUNTER — Ambulatory Visit: Payer: Medicare Other | Admitting: Physical Therapy

## 2015-11-04 ENCOUNTER — Encounter: Payer: Self-pay | Admitting: Physical Therapy

## 2015-11-04 DIAGNOSIS — I6990 Unspecified sequelae of unspecified cerebrovascular disease: Secondary | ICD-10-CM

## 2015-11-04 DIAGNOSIS — R262 Difficulty in walking, not elsewhere classified: Secondary | ICD-10-CM | POA: Diagnosis not present

## 2015-11-04 DIAGNOSIS — R5381 Other malaise: Secondary | ICD-10-CM

## 2015-11-04 DIAGNOSIS — Z9181 History of falling: Secondary | ICD-10-CM | POA: Diagnosis not present

## 2015-11-04 DIAGNOSIS — M6281 Muscle weakness (generalized): Secondary | ICD-10-CM | POA: Diagnosis not present

## 2015-11-04 DIAGNOSIS — R531 Weakness: Secondary | ICD-10-CM

## 2015-11-04 DIAGNOSIS — R296 Repeated falls: Secondary | ICD-10-CM | POA: Diagnosis not present

## 2015-11-04 DIAGNOSIS — W19XXXD Unspecified fall, subsequent encounter: Secondary | ICD-10-CM

## 2015-11-04 NOTE — Therapy (Signed)
[place], [place], [address] Phone: [phone]   Fax:  [phone]  Physical Therapy Treatment  Patient Details  Name: Kristopher Zimmerman MRN: [id number] Date of Birth: 12-Feb-1924 Referring Provider: Dr. Ashby Dawes  Encounter Date: 11/04/2015      PT End of Session - 11/04/15 1009    Visit Number 10   Date for PT Re-Evaluation 12/04/15   PT Start Time 0930   PT Stop Time 1010   PT Time Calculation (min) 40 min   Activity Tolerance Patient limited by fatigue;Patient tolerated treatment well   Behavior During Therapy [facility] for tasks assessed/performed      Past Medical History  Diagnosis Date  . Diabetes mellitus without complication ([place])   . Neuropathy, diabetic ([place])   . Hypertension   . Renal insufficiency   . Stroke ([place])   . Macular degeneration of right eye   . Neuropathy [facility])     Past Surgical History  Procedure Laterality Date  . Back surgery    . Broken leg    . Cervical disc surgery    . Tonsillectomy    . Appendectomy      There were no vitals filed for this visit.  Visit Diagnosis:  Late effects of CVA (cerebrovascular accident)  Debility  Difficulty walking  Weakness  Falls, subsequent encounter      Subjective Assessment - 11/04/15 0929    Subjective "Its going all right"   Currently in Pain? No/denies   Pain Score 0-No pain                         [facility] Adult PT Treatment/Exercise - 11/04/15 0001    Ambulation/Gait   Ambulation/Gait Yes   Ambulation/Gait Assistance 5: Supervision;4: Min guard   Ambulation Distance (Feet) 60 Feet   Assistive device None   Gait Pattern Step-through pattern;Decreased arm swing - right;Decreased arm swing - left;Decreased step length - right;Decreased step length - left;Decreased hip/knee flexion - right;Decreased hip/knee flexion - left   Ambulation Surface Level;Indoor   Gait Comments 1st trial  without AD, Gait trial with SPC x2, slow, shuffling gait, small steps   Lumbar Exercises: Standing   Other Standing Lumbar Exercises Standing march x10 little clarence, March 2x5  knees higher    Other Standing Lumbar Exercises Standing chest press with red ball 2x15   Knee/Hip Exercises: Aerobic   Nustep L5 x 6 min    Knee/Hip Exercises: Machines for Strengthening   Cybex Knee Extension #10 2x15   Cybex Knee Flexion 25# 2x15                  PT Short Term Goals - 10/27/15 1447    PT SHORT TERM GOAL #1   Title understand fall risks in the home   Status Achieved           PT Long Term Goals - 11/01/15 1501    PT LONG TERM GOAL #1   Title demonstrate safe transfers in the clinic   Status Achieved   PT LONG TERM GOAL #2   Title decrease timed up and go test time to 20 seconds   Status Achieved   PT LONG TERM GOAL #3   Title increase berg balance test score to 40/56   PT LONG TERM GOAL #4   Title no falls over a 4 week period   Status On-going  Plan - 11/04/15 1010    Clinical Impression Statement Continues to fatigue with activity, progresses to additional gait trials with SPC and without  AD. Few bouts of LOB with standing march. Tolerated additional standing activities with forward reaching.     Pt will benefit from skilled therapeutic intervention in order to improve on the following deficits Abnormal gait;Cardiopulmonary status limiting activity;Decreased activity tolerance;Decreased balance;Decreased mobility;Decreased range of motion;Decreased endurance;Decreased safety awareness;Difficulty walking   Rehab Potential Good   PT Frequency 2x / week   PT Duration 8 weeks   PT Treatment/Interventions ADLs/Self Care Home Management;Gait training;Stair training;Functional mobility training;Therapeutic activities;Therapeutic exercise;Manual techniques;Patient/family education;Balance training   PT Next Visit Plan Balance and LE strength         Problem List Patient Active Problem List   Diagnosis Date Noted  . 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 HLD (hyperlipidemia)   . Acute CVA (cerebrovascular accident) ([place])   . Lacunar infarct, acute ([place]) 03/02/2015  . Diabetic neuropathy ([place]) 03/02/2015  . Stroke ([place]) 11/28/2014  . CVA (cerebral infarction) 11/28/2014  . Diabetes mellitus ([place]) 11/28/2014  . Chronic kidney disease (CKD) stage G2/A1, mildly decreased glomerular filtration rate (GFR) between 60-89 mL/min/1.73 square meter and albuminuria creatinine ratio less than 30 mg/g 11/28/2014  . Neuropathy in diabetes ([place]) 11/28/2014  . Toe fracture, left 11/28/2014  . DM (diabetes mellitus) type II controlled with renal manifestation ([place]) 11/28/2014  . Essential hypertension 05/27/2014  . Hyperlipidemia 05/27/2014  . Aortic stenosis 05/27/2014  . Abnormal electrocardiogram 05/27/2014  . Chest pain 05/27/2014    Scot Jun, PTA  11/04/2015, 10:14 AM  [place], [place], [address] Phone: [phone]   Fax:  [phone]  Name: Kristopher Zimmerman MRN: [id number] Date of Birth: [birth year]

## 2015-11-08 ENCOUNTER — Encounter: Payer: Self-pay | Admitting: Physical Therapy

## 2015-11-08 ENCOUNTER — Ambulatory Visit: Payer: Medicare Other | Admitting: Physical Therapy

## 2015-11-08 DIAGNOSIS — M6281 Muscle weakness (generalized): Secondary | ICD-10-CM | POA: Diagnosis not present

## 2015-11-08 DIAGNOSIS — R262 Difficulty in walking, not elsewhere classified: Secondary | ICD-10-CM

## 2015-11-08 DIAGNOSIS — R296 Repeated falls: Secondary | ICD-10-CM

## 2015-11-08 DIAGNOSIS — R5381 Other malaise: Secondary | ICD-10-CM | POA: Diagnosis not present

## 2015-11-08 DIAGNOSIS — Z9181 History of falling: Secondary | ICD-10-CM | POA: Diagnosis not present

## 2015-11-08 DIAGNOSIS — I6990 Unspecified sequelae of unspecified cerebrovascular disease: Secondary | ICD-10-CM | POA: Diagnosis not present

## 2015-11-08 NOTE — Therapy (Signed)
[place], [place], [address] Phone: [phone]   Fax:  [phone]  Physical Therapy Treatment  Patient Details  Name: Kristopher Zimmerman MRN: [id number] Date of Birth: [birth year] Referring Provider: Dr. Ashby Dawes  Encounter Date: 11/08/2015      PT End of Session - 11/08/15 1513    Visit Number 11   Date for PT Re-Evaluation 12/04/15   PT Start Time 1428   PT Stop Time 1515   PT Time Calculation (min) 47 min   Activity Tolerance Patient limited by fatigue;Patient tolerated treatment well   Behavior During Therapy [facility] for tasks assessed/performed      Past Medical History  Diagnosis Date  . Diabetes mellitus without complication ([place])   . Neuropathy, diabetic ([place])   . Hypertension   . Renal insufficiency   . Stroke ([place])   . Macular degeneration of right eye   . Neuropathy [facility])     Past Surgical History  Procedure Laterality Date  . Back surgery    . Broken leg    . Cervical disc surgery    . Tonsillectomy    . Appendectomy      There were no vitals filed for this visit.      Subjective Assessment - 11/08/15 1431    Subjective "Im passing by" Wife reports that he has not felt too well the past couple of days. Wife reports that Saturday pt was very pale and tired.   Currently in Pain? No/denies   Pain Score 0-No pain                         [facility] Adult PT Treatment/Exercise - 11/08/15 0001    Ambulation/Gait   Ambulation/Gait Yes   Ambulation/Gait Assistance 5: Supervision;4: Min guard   Ambulation Distance (Feet) 240 Feet   Assistive device None;Straight cane   Gait Pattern Step-through pattern;Decreased arm swing - right;Decreased arm swing - left;Decreased step length - right;Decreased step length - left;Decreased hip/knee flexion - right;Decreased hip/knee flexion - left   Ambulation Surface Level;Indoor   Gait Comments Slow, shuffling gait, small  steps, Cues provided to increase step lenght with RLE    High Level Balance   High Level Balance Comments Standing reach with red ball bilat UE 2x15, Standing come pick up 2x10; Standing ball kicks, static and slow rolling x10   Lumbar Exercises: Seated   Other Seated Lumbar Exercises Rows black Tband 2x20    Knee/Hip Exercises: Aerobic   Nustep L5 x 8 min    Knee/Hip Exercises: Machines for Strengthening   Cybex Leg Press #60 3x10; LLE only #20 2x10   Knee/Hip Exercises: Standing   Other Standing Knee Exercises Standing ball toss x10  posterior lean mod assist from therapist                  PT Short Term Goals - 10/27/15 1447    PT SHORT TERM GOAL #1   Title understand fall risks in the home   Status Achieved           PT Long Term Goals - 11/01/15 1501    PT LONG TERM GOAL #1   Title demonstrate safe transfers in the clinic   Status Achieved   PT LONG TERM GOAL #2   Title decrease timed up and go test time to 20 seconds   Status Achieved   PT LONG TERM GOAL #3  Title increase berg balance test score to 40/56   PT LONG TERM GOAL #4   Title no falls over a 4 week period   Status On-going               Plan - 11/08/15 1513    Clinical Impression Statement Pt able to complete all interventions. Increase gait distance this date, cues provided to increase step length. Pt with difficulty transferring from sit to stand after leg press intervention. Tolerated additional standing interventions requiring mod assist from therapist to keep balance. Heavy posterior lean when standing.    Rehab Potential Good   PT Frequency 2x / week   PT Duration 8 weeks   PT Treatment/Interventions ADLs/Self Care Home Management;Gait training;Stair training;Functional mobility training;Therapeutic activities;Therapeutic exercise;Manual techniques;Patient/family education;Balance training   PT Next Visit Plan Balance and LE strength      Patient will benefit from skilled  therapeutic intervention in order to improve the following deficits and impairments:  Abnormal gait, Cardiopulmonary status limiting activity, Decreased activity tolerance, Decreased balance, Decreased mobility, Decreased range of motion, Decreased endurance, Decreased safety awareness, Difficulty walking  Visit Diagnosis: Difficulty walking  Muscle weakness (generalized)  Repeated falls     Problem List Patient Active Problem List   Diagnosis Date Noted  . 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 HLD (hyperlipidemia)   . Acute CVA (cerebrovascular accident) ([place])   . Lacunar infarct, acute ([place]) 03/02/2015  . Diabetic neuropathy ([place]) 03/02/2015  . Stroke ([place]) 11/28/2014  . CVA (cerebral infarction) 11/28/2014  . Diabetes mellitus ([place]) 11/28/2014  . Chronic kidney disease (CKD) stage G2/A1, mildly decreased glomerular filtration rate (GFR) between 60-89 mL/min/1.73 square meter and albuminuria creatinine ratio less than 30 mg/g 11/28/2014  . Neuropathy in diabetes ([place]) 11/28/2014  . Toe fracture, left 11/28/2014  . DM (diabetes mellitus) type II controlled with renal manifestation ([place]) 11/28/2014  . Essential hypertension 05/27/2014  . Hyperlipidemia 05/27/2014  . Aortic stenosis 05/27/2014  . Abnormal electrocardiogram 05/27/2014  . Chest pain 05/27/2014    Scot Jun, PTA  11/08/2015, 3:17 PM  [place], [place], [address] Phone: [phone]   Fax:  [phone]  Name: Kristopher Zimmerman MRN: [id number] Date of Birth: 1923-12-15

## 2015-11-10 ENCOUNTER — Ambulatory Visit: Payer: Medicare Other | Admitting: Physical Therapy

## 2015-11-10 ENCOUNTER — Encounter: Payer: Self-pay | Admitting: Physical Therapy

## 2015-11-10 DIAGNOSIS — R262 Difficulty in walking, not elsewhere classified: Secondary | ICD-10-CM

## 2015-11-10 DIAGNOSIS — M6281 Muscle weakness (generalized): Secondary | ICD-10-CM

## 2015-11-10 DIAGNOSIS — R5381 Other malaise: Secondary | ICD-10-CM | POA: Diagnosis not present

## 2015-11-10 DIAGNOSIS — R296 Repeated falls: Secondary | ICD-10-CM | POA: Diagnosis not present

## 2015-11-10 DIAGNOSIS — Z9181 History of falling: Secondary | ICD-10-CM | POA: Diagnosis not present

## 2015-11-10 DIAGNOSIS — I6990 Unspecified sequelae of unspecified cerebrovascular disease: Secondary | ICD-10-CM | POA: Diagnosis not present

## 2015-11-10 NOTE — Therapy (Signed)
[place], [place], [address] Phone: [phone]   Fax:  [phone]  Physical Therapy Treatment  Patient Details  Name: Kristopher Zimmerman MRN: [id number] Date of Birth: [birth year] Referring Provider: Dr. Ashby Dawes  Encounter Date: 11/10/2015      PT End of Session - 11/10/15 1448    Visit Number 12   Date for PT Re-Evaluation 12/04/15   PT Start Time 1400   PT Stop Time 1448   PT Time Calculation (min) 48 min   Activity Tolerance Patient tolerated treatment well;Patient limited by fatigue   Behavior During Therapy [facility] for tasks assessed/performed      Past Medical History  Diagnosis Date  . Diabetes mellitus without complication ([place])   . Neuropathy, diabetic ([place])   . Hypertension   . Renal insufficiency   . Stroke ([place])   . Macular degeneration of right eye   . Neuropathy [facility])     Past Surgical History  Procedure Laterality Date  . Back surgery    . Broken leg    . Cervical disc surgery    . Tonsillectomy    . Appendectomy      There were no vitals filed for this visit.      Subjective Assessment - 11/10/15 1403    Subjective "Im tired before I started"   Currently in Pain? Yes   Pain Score 4    Pain Location Back                         [facility] Adult PT Treatment/Exercise - 11/10/15 0001    Lumbar Exercises: Aerobic   UBE (Upper Arm Bike) L2.5x4 min   Lumbar Exercises: Seated   Other Seated Lumbar Exercises seated ball toss x10   heavy posterior lean upon catching high tosses   Knee/Hip Exercises: Aerobic   Nustep L5 x 8 min    Knee/Hip Exercises: Machines for Strengthening   Cybex Knee Extension #15 2x15   Cybex Knee Flexion 35# 3x10   Cybex Leg Press #60 2x15; LLE only #20 2x10                  PT Short Term Goals - 10/27/15 1447    PT SHORT TERM GOAL #1   Title understand fall risks in the home   Status Achieved            PT Long Term Goals - 11/01/15 1501    PT LONG TERM GOAL #1   Title demonstrate safe transfers in the clinic   Status Achieved   PT LONG TERM GOAL #2   Title decrease timed up and go test time to 20 seconds   Status Achieved   PT LONG TERM GOAL #3   Title increase berg balance test score to 40/56   PT LONG TERM GOAL #4   Title no falls over a 4 week period   Status On-going               Plan - 11/10/15 1448    Clinical Impression Statement Pt able to complete all machine level interventions with increase weight/resistance. Pt reported that he was tired after completing LE strengthening exercises. Pt with excessive posterior lean with seated ball toss.    Rehab Potential Good   PT Frequency 2x / week   PT Duration 8 weeks   PT Treatment/Interventions ADLs/Self Care Home Management;Gait training;Stair training;Functional mobility training;Therapeutic activities;Therapeutic exercise;Manual  techniques;Patient/family education;Balance training   PT Next Visit Plan Balance and LE strength      Patient will benefit from skilled therapeutic intervention in order to improve the following deficits and impairments:  Abnormal gait, Cardiopulmonary status limiting activity, Decreased activity tolerance, Decreased balance, Decreased mobility, Decreased range of motion, Decreased endurance, Decreased safety awareness, Difficulty walking  Visit Diagnosis: Difficulty walking  Muscle weakness (generalized)     Problem List Patient Active Problem List   Diagnosis Date Noted  . 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 HLD (hyperlipidemia)   . Acute CVA (cerebrovascular accident) ([place])   . Lacunar infarct, acute ([place]) 03/02/2015  . Diabetic neuropathy ([place]) 03/02/2015  . Stroke ([place]) 11/28/2014  . CVA (cerebral infarction) 11/28/2014  . Diabetes mellitus ([place]) 11/28/2014  . Chronic kidney disease (CKD) stage G2/A1, mildly decreased glomerular filtration rate (GFR) between  60-89 mL/min/1.73 square meter and albuminuria creatinine ratio less than 30 mg/g 11/28/2014  . Neuropathy in diabetes ([place]) 11/28/2014  . Toe fracture, left 11/28/2014  . DM (diabetes mellitus) type II controlled with renal manifestation ([place]) 11/28/2014  . Essential hypertension 05/27/2014  . Hyperlipidemia 05/27/2014  . Aortic stenosis 05/27/2014  . Abnormal electrocardiogram 05/27/2014  . Chest pain 05/27/2014    Scot Jun, PTA  11/10/2015, 2:51 PM  [place], [place], [address] Phone: [phone]   Fax:  [phone]  Name: Kristopher Zimmerman MRN: [id number] Date of Birth: [birth year]

## 2015-11-10 NOTE — Addendum Note (Signed)
Addended by: Sumner Boast on: 11/10/2015 10:42 AM   Modules accepted: Orders

## 2015-11-15 ENCOUNTER — Ambulatory Visit: Payer: Medicare Other | Admitting: Physical Therapy

## 2015-11-15 ENCOUNTER — Encounter (INDEPENDENT_AMBULATORY_CARE_PROVIDER_SITE_OTHER): Payer: Medicare Other | Admitting: Ophthalmology

## 2015-11-15 ENCOUNTER — Encounter: Payer: Self-pay | Admitting: Physical Therapy

## 2015-11-15 DIAGNOSIS — R262 Difficulty in walking, not elsewhere classified: Secondary | ICD-10-CM

## 2015-11-15 DIAGNOSIS — R5381 Other malaise: Secondary | ICD-10-CM | POA: Diagnosis not present

## 2015-11-15 DIAGNOSIS — I6990 Unspecified sequelae of unspecified cerebrovascular disease: Secondary | ICD-10-CM | POA: Diagnosis not present

## 2015-11-15 DIAGNOSIS — R296 Repeated falls: Secondary | ICD-10-CM | POA: Diagnosis not present

## 2015-11-15 DIAGNOSIS — M6281 Muscle weakness (generalized): Secondary | ICD-10-CM

## 2015-11-15 DIAGNOSIS — Z9181 History of falling: Secondary | ICD-10-CM | POA: Diagnosis not present

## 2015-11-15 NOTE — Therapy (Signed)
[place], [place], [address] Phone: [phone]   Fax:  [phone]  Physical Therapy Treatment  Patient Details  Name: Kristopher Zimmerman MRN: [id number] Date of Birth: [birth year] Referring Provider: Dr. Ashby Dawes  Encounter Date: 11/15/2015      PT End of Session - 11/15/15 1501    Visit Number 13   Date for PT Re-Evaluation 12/04/15   PT Start Time 1428   PT Stop Time 1508   PT Time Calculation (min) 40 min   Activity Tolerance Patient tolerated treatment well   Behavior During Therapy [facility] for tasks assessed/performed      Past Medical History  Diagnosis Date  . Diabetes mellitus without complication ([place])   . Neuropathy, diabetic ([place])   . Hypertension   . Renal insufficiency   . Stroke ([place])   . Macular degeneration of right eye   . Neuropathy [facility])     Past Surgical History  Procedure Laterality Date  . Back surgery    . Broken leg    . Cervical disc surgery    . Tonsillectomy    . Appendectomy      There were no vitals filed for this visit.      Subjective Assessment - 11/15/15 1434    Subjective Pt =reports that his diabetic neuropathy has been giving him trouble with his legs.    Currently in Pain? Yes   Pain Score 5    Pain Location Leg   Pain Orientation Left;Right   Pain Descriptors / Indicators Dull                         [facility] Adult PT Treatment/Exercise - 11/15/15 0001    Ambulation/Gait   Ambulation/Gait Yes   Ambulation/Gait Assistance 5: Supervision;4: Min guard   Ambulation Distance (Feet) 60 Feet   Assistive device None;Crutches   Gait Pattern Step-through pattern;Decreased arm swing - right;Decreased arm swing - left;Decreased step length - right;Decreased step length - left;Decreased hip/knee flexion - right;Decreased hip/knee flexion - left   Gait Comments Slow, shuffling gait, small steps, Cues provided to increase step lenght  with RLE    High Level Balance   High Level Balance Activities Side stepping   High Level Balance Comments standing march 2x10 requiring CGA   Lumbar Exercises: Aerobic   UBE (Upper Arm Bike) L2.5  4 min   Lumbar Exercises: Seated   Other Seated Lumbar Exercises seated ball toss x10   Heavy posterior lean, able to correct    Knee/Hip Exercises: Aerobic   Nustep L5 x 8 min                   PT Short Term Goals - 10/27/15 1447    PT SHORT TERM GOAL #1   Title understand fall risks in the home   Status Achieved           PT Long Term Goals - 11/15/15 1437    PT LONG TERM GOAL #4   Title no falls over a 4 week period   Status Achieved               Plan - 11/15/15 1511    Clinical Impression Statement Pt reports fatigue pre treatment. Today's interventions focuses more in standing. Heavy posterior lean during all standing interventions requiring therapist assist. Pt had progressed and met most goals. Pt also with questions about discharge.  Rehab Potential Good   PT Frequency 2x / week   PT Duration 8 weeks   PT Treatment/Interventions ADLs/Self Care Home Management;Gait training;Stair training;Functional mobility training;Therapeutic activities;Therapeutic exercise;Manual techniques;Patient/family education;Balance training   PT Next Visit Plan Spoke with lead PT, Pt has agreed to be placed on a two week hold. Pt will call back and schedule more appointments within 2 weeks otherwise D/C from PT      Patient will benefit from skilled therapeutic intervention in order to improve the following deficits and impairments:  Abnormal gait, Cardiopulmonary status limiting activity, Decreased activity tolerance, Decreased balance, Decreased mobility, Decreased range of motion, Decreased endurance, Decreased safety awareness, Difficulty walking  Visit Diagnosis: Difficulty walking  Muscle weakness (generalized)  Repeated falls     Problem List Patient Active  Problem List   Diagnosis Date Noted  . 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 HLD (hyperlipidemia)   . Acute CVA (cerebrovascular accident) ([place])   . Lacunar infarct, acute ([place]) 03/02/2015  . Diabetic neuropathy ([place]) 03/02/2015  . Stroke ([place]) 11/28/2014  . CVA (cerebral infarction) 11/28/2014  . Diabetes mellitus ([place]) 11/28/2014  . Chronic kidney disease (CKD) stage G2/A1, mildly decreased glomerular filtration rate (GFR) between 60-89 mL/min/1.73 square meter and albuminuria creatinine ratio less than 30 mg/g 11/28/2014  . Neuropathy in diabetes ([place]) 11/28/2014  . Toe fracture, left 11/28/2014  . DM (diabetes mellitus) type II controlled with renal manifestation ([place]) 11/28/2014  . Essential hypertension 05/27/2014  . Hyperlipidemia 05/27/2014  . Aortic stenosis 05/27/2014  . Abnormal electrocardiogram 05/27/2014  . Chest pain 05/27/2014    Scot Jun, PTA  11/15/2015, 3:21 PM  [place], [place], [address] Phone: [phone]   Fax:  [phone]  Name: Kristopher Zimmerman MRN: [id number] Date of Birth: Aug 01, 1923

## 2015-11-17 ENCOUNTER — Ambulatory Visit: Payer: Medicare Other | Admitting: Physical Therapy

## 2015-11-17 DIAGNOSIS — G603 Idiopathic progressive neuropathy: Secondary | ICD-10-CM | POA: Diagnosis not present

## 2015-11-17 DIAGNOSIS — I10 Essential (primary) hypertension: Secondary | ICD-10-CM | POA: Diagnosis not present

## 2015-11-17 DIAGNOSIS — E1165 Type 2 diabetes mellitus with hyperglycemia: Secondary | ICD-10-CM | POA: Diagnosis not present

## 2015-11-17 DIAGNOSIS — E782 Mixed hyperlipidemia: Secondary | ICD-10-CM | POA: Diagnosis not present

## 2015-11-22 ENCOUNTER — Ambulatory Visit: Payer: Medicare Other | Admitting: Physical Therapy

## 2015-11-24 ENCOUNTER — Ambulatory Visit: Payer: Medicare Other | Admitting: Physical Therapy

## 2015-11-30 DIAGNOSIS — E119 Type 2 diabetes mellitus without complications: Secondary | ICD-10-CM | POA: Diagnosis not present

## 2015-11-30 DIAGNOSIS — E782 Mixed hyperlipidemia: Secondary | ICD-10-CM | POA: Diagnosis not present

## 2015-11-30 DIAGNOSIS — I10 Essential (primary) hypertension: Secondary | ICD-10-CM | POA: Diagnosis not present

## 2015-12-01 ENCOUNTER — Encounter: Payer: Self-pay | Admitting: Nurse Practitioner

## 2015-12-01 ENCOUNTER — Ambulatory Visit (INDEPENDENT_AMBULATORY_CARE_PROVIDER_SITE_OTHER): Payer: Medicare Other | Admitting: Nurse Practitioner

## 2015-12-01 VITALS — BP 140/77 | HR 73 | Ht 69.5 in | Wt 204.6 lb

## 2015-12-01 DIAGNOSIS — I638 Other cerebral infarction: Secondary | ICD-10-CM

## 2015-12-01 DIAGNOSIS — E785 Hyperlipidemia, unspecified: Secondary | ICD-10-CM

## 2015-12-01 DIAGNOSIS — G8190 Hemiplegia, unspecified affecting unspecified side: Secondary | ICD-10-CM | POA: Diagnosis not present

## 2015-12-01 DIAGNOSIS — I6389 Other cerebral infarction: Secondary | ICD-10-CM

## 2015-12-01 DIAGNOSIS — I10 Essential (primary) hypertension: Secondary | ICD-10-CM | POA: Diagnosis not present

## 2015-12-01 DIAGNOSIS — I679 Cerebrovascular disease, unspecified: Secondary | ICD-10-CM

## 2015-12-01 DIAGNOSIS — I639 Cerebral infarction, unspecified: Secondary | ICD-10-CM | POA: Diagnosis not present

## 2015-12-01 NOTE — Patient Instructions (Signed)
Continue exercising to continue to improve strength, and use rolling walker to prevent falls Continue Plavix for secondary stroke prevention Continue Pravachol for cholesterol LDL less than 70 Hemoglobin A1c less than 6.5 Follow-up in 4 months

## 2015-12-01 NOTE — Progress Notes (Signed)
[facility]  PATIENT: Kristopher Zimmerman DOB: Jun 02, 1924   REASON FOR VISIT: Follow-up for hemiparesis and history of recurrent stroke 12/ 2016 HISTORY FROM: Patient and wife    HISTORY OF PRESENT ILLNESS:82 y.o. male with a history of dm, htn who presented with slurred speech and increased falls since 11/27/14 He injured his toe fairly badly the day prior and was in the ER. He had taken his evening medications prior to coming in, so his wife did not think anything of the slurred speech and it was not mentioned to ER staff. CT scan of the head showed mild generalized atrophy and small vessel ischemic changes. MRI scan of the brain showed an acute sub-centimeter left basal ganglia/corona radiata infarct. MRA of the brain did not reveal large vessel stenosis. Carotid ultrasound showed no significant extracranial stenosis. Transthoracic echo showed normal ejection fraction of embolism. LDL cholesterol was normal at 43 mg percent. Hemoglobin A1c was slightly elevated at 7.0. Vascular risk factors identified included hypertension, diabetes and hyperlipidemia. Patient was seen by physical occupational speech therapy. He was started on aspirin 325 mg daily and advise aggressive risk factor management. Patient hast done well since discharge. His speech and has improved but he still has mild right facial droop. His right leg weakness is improved. His blood pressure is better controlled after additional metoprolol and is 116/65 today. He has no new complaints. He has mild lower extremity paresthesia [place]. 3. Diabetic neuropathy. He states his sugars are well controlled and hemoglobin A1c has not been repeated. Update 2/1/2017PS : He returns for follow-up after last visit to [facility]. He was admitted to [facility] in December 2016 with a right pontine lacunar infarct. He had presented with facial droop and left-sided weakness. I have personally reviewed imaging studies and hospital  stroke related testing. MRA of the brain and neck but did not show significant stenosis. Carotid ultrasound was unremarkable. LDL cholesterol was 42 mg percent and hemoglobin A1c was 7.4. Patient was started on Plavix and states is tolerating it well. He is able to walk with a walker. He states he is learned to be careful and get up slowly. His balance is still not back to normal but his walking more confidently with a walker. He does get tired. He has to rest after walking short distances. He states his blood pressure control is good and it is 138/68 today in office. His tolerating Plavix well without bleeding or bruising. He is currently getting a home physical and occupational therapy. He is had no major falls. UPDATE 05/03/2017CM Mr. Chess returns for follow-up with his wife for history of stroke in April 2016 in December 2016. He is currently taking Plavix with minimal bruising. He has not had further stroke or TIA episodes. He was getting some physical therapy however that concluded on April 17 and he continues to do his home exercise program. He is currently walking with a walker. He has had one fall and one  near fall since  last seen. Reviewed blood pressure logs over the last 2 weeks and his blood pressure control is good. He is independent with feeding dressing and bathing with minimal assistance. He is sleeping well at night. Appetite is good. He returns for reevaluation   REVIEW OF SYSTEMS: Full 14 system review of systems performed and notable only for those listed, all others are neg:  Constitutional: neg  Cardiovascular: neg Ear/Nose/Throat: neg  Skin: neg Eyes: neg Respiratory: neg Gastroitestinal: neg  Hematology/Lymphatic: neg  Endocrine: neg Musculoskeletal:neg Allergy/Immunology: neg Neurological: neg Psychiatric: neg Sleep : neg   ALLERGIES: No Known Allergies  HOME MEDICATIONS: Outpatient Prescriptions Prior to Visit  Medication Sig Dispense Refill  . B Complex-C  (B-COMPLEX WITH VITAMIN C) tablet Take 1 tablet by mouth daily.    . calcium carbonate (OS-CAL) 600 MG TABS Take 600 mg by mouth daily with breakfast.     . Carboxymethylcellulose Sodium (REFRESH LIQUIGEL OP) Apply 1 drop to eye 4 (four) times daily.    . clopidogrel (PLAVIX) 75 MG tablet Take 1 tablet (75 mg total) by mouth daily. 30 tablet 1  . cyclobenzaprine (FLEXERIL) 10 MG tablet Take 10 mg by mouth at bedtime.    . DULoxetine (CYMBALTA) 30 MG capsule Take 30 mg by mouth daily. Take with a 60 to make 90    . DULoxetine (CYMBALTA) 60 MG capsule Take 60 mg by mouth daily. Take with 30 to make 90    . glyBURIDE-metformin (GLUCOVANCE) 2.5-500 MG per tablet Take 2 tablets by mouth daily with breakfast. 2 tabs at breakfast and 1 at Supper    . hydrochlorothiazide (HYDRODIURIL) 25 MG tablet Take 25 mg by mouth daily.    . irbesartan (AVAPRO) 150 MG tablet Take 150 mg by mouth daily.    Marland Kitchen LORazepam (ATIVAN) 2 MG tablet Take 2 mg by mouth at bedtime.    . Magnesium Citrate 100 MG TABS Take 1 tablet by mouth daily.    . meloxicam (MOBIC) 15 MG tablet Take 15 mg by mouth at bedtime.    . metoprolol tartrate (LOPRESSOR) 25 MG tablet Take 0.5 tablets (12.5 mg total) by mouth 2 (two) times daily. 60 tablet 1  . mirabegron ER (MYRBETRIQ) 25 MG TB24 tablet Take 25 mg by mouth daily.    . Multiple Vitamins-Minerals (PRESERVISION AREDS 2) CAPS Take by mouth.    . pramipexole (MIRAPEX) 0.5 MG tablet Take 0.5 mg by mouth at bedtime.     . pravastatin (PRAVACHOL) 40 MG tablet Take 40 mg by mouth daily.    Marland Kitchen Propylene Glycol (SYSTANE BALANCE) 0.6 % SOLN Apply 1 drop to eye 4 (four) times daily.    . tamsulosin (FLOMAX) 0.4 MG CAPS Take by mouth.    . vitamin C (ASCORBIC ACID) 500 MG tablet Take 500 mg by mouth daily.     No facility-administered medications prior to visit.    PAST MEDICAL HISTORY: Past Medical History  Diagnosis Date  . Diabetes mellitus without complication ([place])   . Neuropathy,  diabetic ([place])   . Hypertension   . Renal insufficiency   . Stroke ([place])   . Macular degeneration of right eye   . Neuropathy ([place])     PAST SURGICAL HISTORY: Past Surgical History  Procedure Laterality Date  . Back surgery    . Broken leg    . Cervical disc surgery    . Tonsillectomy    . Appendectomy      FAMILY HISTORY: Family History  Problem Relation Age of Onset  . Cirrhosis Father   . Diabetes Brother   . Cancer - Lung Sister   . Alzheimer's disease Sister   . Heart disease Sister   . Cancer - Lung Brother     SOCIAL HISTORY: Social History   Social History  . Marital Status: Married    Spouse Name: N/A  . Number of Children: N/A  . Years of Education: N/A   Occupational History  . Not on file.   Social  History Main Topics  . Smoking status: Former Smoker    Quit date: 12/22/1979  . Smokeless tobacco: Not on file  . Alcohol Use: 0.6 oz/week    1 Glasses of wine per week     Comment: wine  . Drug Use: No  . Sexual Activity: Not on file   Other Topics Concern  . Not on file   Social History Narrative   Lives at home with wife.  3 grown kids.  Retired.  Caffeine none.       PHYSICAL EXAM  Filed Vitals:   12/01/15 1255  BP: 179/77  Pulse: 73  Height: 5' 9.5" (1.765 m)  Weight: 204 lb 9.6 oz (92.806 kg)   Body mass index is 29.79 kg/(m^2). General: well developed, well nourished Obese elderly male, seated, in no evident distress Head: head normocephalic and atraumatic.  Neck: supple with no carotid  bruits Cardiovascular: regular rate and rhythm, no murmurs Musculoskeletal: no deformity Skin: no rash/petichiae Vascular: Normal pulses all extremities Neurological examination   Mentation:Awake and fully alert. Oriented to place and time. Recent and remote memory intact. Attention span, concentration and fund of knowledge appropriate. Mood and affect appropriate.  Cranial Nerves: Fundoscopic exam not done Pupils equal, briskly  reactive to light. Extraocular movements full without nystagmus. Visual fields full to confrontation. Hearing intact. Facial sensation intact. Mild right lower face weakness but, tongue, palate moves normally and symmetrically.  Motor: Normal bulk and tone. Normal strength in all tested extremity muscles. Except mild bilateral ankle dorsiflexor  weakness. Diminished fine finger movements on the right.  Sensory.: intact to touch ,pinprick But diminished .position and vibratory sensation over toes bilaterally.  Coordination: Rapid alternating movements normal in all extremities. Finger-to-nose and heel-to-shin performed accurately bilaterally. Gait and Station: Arises from chair without difficulty. Stance is stooped. Uses a walker.. Gait demonstrates normal stride length and balance .  Reflexes: 1+ and symmetric except ankle jerks are absent.. Toes downgoing.    DIAGNOSTIC DATA (LABS, IMAGING, TESTING) - I reviewed patient records, labs, notes, testing and imaging myself where available.  Lab Results  Component Value Date   WBC 6.7 07/25/2015   HGB 11.6* 07/25/2015   HCT 34.0* 07/25/2015   MCV 93.8 07/25/2015   PLT 195 07/25/2015      Component Value Date/Time   NA 137 07/26/2015 1115   K 4.4 07/26/2015 1115   CL 99* 07/26/2015 1115   CO2 28 07/26/2015 1115   GLUCOSE 173* 07/26/2015 1115   BUN 32* 07/26/2015 1115   CREATININE 1.35* 07/26/2015 1115   CALCIUM 10.1 07/26/2015 1115   PROT 6.3* 07/25/2015 1606   ALBUMIN 3.6 07/25/2015 1606   AST 25 07/25/2015 1606   ALT 18 07/25/2015 1606   ALKPHOS 78 07/25/2015 1606   BILITOT 0.5 07/25/2015 1606   GFRNONAA 45* 07/26/2015 1115   GFRAA 52* 07/26/2015 1115   Lab Results  Component Value Date   CHOL 103 07/26/2015   HDL 36* 07/26/2015   LDLCALC 42 07/26/2015   TRIG 124 07/26/2015   CHOLHDL 2.9 07/26/2015   Lab Results  Component Value Date   HGBA1C 7.4* 07/26/2015    ASSESSMENT AND PLAN 82 year male with left basal ganglia  subcortical infarct secondary to small vessel disease in April 2016 with vascular risk factors of diabetes and hyperlipidemia. New right pontine lacunar infarct in December 2016 here for follow-up visit he has not had further stroke or TIA symptoms .The patient is a current patient of Dr. Leonie Man  who is out of the office today . This note is sent to the work in doctor.     PLAN: Continue exercising to continue to improve strength, and use rolling walker to prevent falls Continue Plavix for secondary stroke prevention Continue Pravachol for cholesterol LDL less than 70 Hemoglobin A1c less than 6.5 Remove any rugs, night light in the bathroom, for safety and to prevent falls. Follow-up in 4 months Dennie Bible, [facility], [facility], APRN  [facility] [address] [place], [place] [address] [phone]

## 2015-12-02 NOTE — Progress Notes (Signed)
I reviewed note and agree with plan.   Penni Bombard, MD [id number], [id number] PM Certified in Neurology, Neurophysiology and Neuroimaging  [facility] [address], [place], [place] [address] [phone]

## 2016-01-12 ENCOUNTER — Encounter (INDEPENDENT_AMBULATORY_CARE_PROVIDER_SITE_OTHER): Payer: Medicare Other | Admitting: Ophthalmology

## 2016-01-12 DIAGNOSIS — I10 Essential (primary) hypertension: Secondary | ICD-10-CM

## 2016-01-12 DIAGNOSIS — H35033 Hypertensive retinopathy, bilateral: Secondary | ICD-10-CM | POA: Diagnosis not present

## 2016-01-12 DIAGNOSIS — H43813 Vitreous degeneration, bilateral: Secondary | ICD-10-CM | POA: Diagnosis not present

## 2016-01-12 DIAGNOSIS — H353231 Exudative age-related macular degeneration, bilateral, with active choroidal neovascularization: Secondary | ICD-10-CM | POA: Diagnosis not present

## 2016-01-12 DIAGNOSIS — E113211 Type 2 diabetes mellitus with mild nonproliferative diabetic retinopathy with macular edema, right eye: Secondary | ICD-10-CM | POA: Diagnosis not present

## 2016-01-12 DIAGNOSIS — E113292 Type 2 diabetes mellitus with mild nonproliferative diabetic retinopathy without macular edema, left eye: Secondary | ICD-10-CM | POA: Diagnosis not present

## 2016-01-12 DIAGNOSIS — E11311 Type 2 diabetes mellitus with unspecified diabetic retinopathy with macular edema: Secondary | ICD-10-CM

## 2016-02-03 NOTE — Progress Notes (Signed)
HPI: Follow-up aortic stenosis. Echocardiogram November 2015 showed normal LV function, mild aortic stenosis with a mean gradient of 10 mmHg, mild mitral regurgitation and mild biatrial enlargement. Nuclear study 2015 showed an ejection fraction of 44%. Perfusion was normal. Had CVA 12/16. Echocardiogram December 2016 showed normal LV systolic function, grade 1 diastolic dysfunction, sclerotic aortic valve and mild left atrial enlargement. Since he was last seen He denies dyspnea, chest pain, palpitations or syncope.  Current Outpatient Prescriptions  Medication Sig Dispense Refill  . amLODipine (NORVASC) 2.5 MG tablet Take 2.5 mg by mouth daily.  0  . B Complex-C (B-COMPLEX WITH VITAMIN C) tablet Take 1 tablet by mouth daily.    . calcium carbonate (OS-CAL) 600 MG TABS Take 600 mg by mouth daily with breakfast.     . Carboxymethylcellulose Sodium (REFRESH LIQUIGEL OP) Apply 1 drop to eye 4 (four) times daily.    . clopidogrel (PLAVIX) 75 MG tablet Take 1 tablet (75 mg total) by mouth daily. 30 tablet 1  . cyclobenzaprine (FLEXERIL) 10 MG tablet Take 10 mg by mouth at bedtime.    . DULoxetine (CYMBALTA) 30 MG capsule Take 30 mg by mouth daily. Take with a 60 to make 90    . DULoxetine (CYMBALTA) 60 MG capsule Take 60 mg by mouth daily. Take with 30 to make 90    . glyBURIDE-metformin (GLUCOVANCE) 2.5-500 MG per tablet Take 1 tablet by mouth 2 (two) times daily with a meal. 2 tabs at breakfast and 1 at Supper    . hydrochlorothiazide (HYDRODIURIL) 25 MG tablet Take 25 mg by mouth daily.    . irbesartan (AVAPRO) 150 MG tablet Take 150 mg by mouth 2 (two) times daily.     Marland Kitchen LORazepam (ATIVAN) 2 MG tablet Take 2 mg by mouth at bedtime.    . Magnesium Citrate 100 MG TABS Take 1 tablet by mouth daily.    . meloxicam (MOBIC) 15 MG tablet Take 15 mg by mouth at bedtime.    . metoprolol tartrate (LOPRESSOR) 25 MG tablet Take 0.5 tablets (12.5 mg total) by mouth 2 (two) times daily. 60 tablet 1  .  mirabegron ER (MYRBETRIQ) 25 MG TB24 tablet Take 25 mg by mouth daily.    . Multiple Vitamins-Minerals (PRESERVISION AREDS 2) CAPS Take by mouth.    . pramipexole (MIRAPEX) 0.5 MG tablet Take 0.5 mg by mouth at bedtime.     . pravastatin (PRAVACHOL) 40 MG tablet Take 40 mg by mouth daily.    Marland Kitchen Propylene Glycol (SYSTANE BALANCE) 0.6 % SOLN Apply 1 drop to eye 4 (four) times daily.    . tamsulosin (FLOMAX) 0.4 MG CAPS Take by mouth.    . vitamin C (ASCORBIC ACID) 500 MG tablet Take 500 mg by mouth daily.     No current facility-administered medications for this visit.     Past Medical History  Diagnosis Date  . Diabetes mellitus without complication ([place])   . Neuropathy, diabetic ([place])   . Hypertension   . Renal insufficiency   . Stroke ([place])   . Macular degeneration of right eye   . Neuropathy [facility])     Past Surgical History  Procedure Laterality Date  . Back surgery    . Broken leg    . Cervical disc surgery    . Tonsillectomy    . Appendectomy      Social History   Social History  . Marital Status: Married    Spouse Name:  N/A  . Number of Children: N/A  . Years of Education: N/A   Occupational History  . Not on file.   Social History Main Topics  . Smoking status: Former Smoker    Quit date: 12/22/1979  . Smokeless tobacco: Not on file  . Alcohol Use: 0.6 oz/week    1 Glasses of wine per week     Comment: wine  . Drug Use: No  . Sexual Activity: Not on file   Other Topics Concern  . Not on file   Social History Narrative   Lives at home with wife.  3 grown kids.  Retired.  Caffeine none.      Family History  Problem Relation Age of Onset  . Cirrhosis Father   . Diabetes Brother   . Cancer - Lung Sister   . Alzheimer's disease Sister   . Heart disease Sister   . Cancer - Lung Brother     ROS:  He has bilateral knee pain and unsteadiness with ambulation causing frequent falls but no fevers or chills, productive cough, hemoptysis, dysphasia,  odynophagia, melena, hematochezia, dysuria, hematuria, rash, seizure activity, orthopnea, PND, pedal edema, claudication. Remaining systems are negative.  Physical Exam: Well-developed well-nourished in no acute distress.  Skin is warm and dry.  HEENT is normal.  Neck is supple.  Chest is clear to auscultation with normal expansion.  Cardiovascular exam is regular rate and rhythm. 2/6 systolic murmur left sternal border. Abdominal exam nontender or distended. No masses palpated. Extremities show no edema. neuro grossly intact  ECG Sinus rhythm at a rate of 78. First-degree AV block. Left bundle branch block.  Assessment and plan  1 hypertension-blood pressure controlled. Continue present medications. 2 hyperlipidemia-continue statin. 3 aortic stenosis-most recent echocardiogram did not show significant AS.  Kirk Ruths, MD

## 2016-02-08 ENCOUNTER — Encounter: Payer: Self-pay | Admitting: Cardiology

## 2016-02-08 ENCOUNTER — Ambulatory Visit (INDEPENDENT_AMBULATORY_CARE_PROVIDER_SITE_OTHER): Payer: Medicare Other | Admitting: Cardiology

## 2016-02-08 VITALS — BP 134/60 | HR 78 | Ht 69.0 in | Wt 208.0 lb

## 2016-02-08 DIAGNOSIS — I10 Essential (primary) hypertension: Secondary | ICD-10-CM | POA: Diagnosis not present

## 2016-02-08 DIAGNOSIS — I350 Nonrheumatic aortic (valve) stenosis: Secondary | ICD-10-CM

## 2016-02-08 DIAGNOSIS — I639 Cerebral infarction, unspecified: Secondary | ICD-10-CM | POA: Diagnosis not present

## 2016-02-08 DIAGNOSIS — E785 Hyperlipidemia, unspecified: Secondary | ICD-10-CM

## 2016-02-08 NOTE — Patient Instructions (Signed)
Your physician wants you to follow-up in: 1 year with Dr. Stanford Breed. You will receive a reminder letter in the mail two months in advance. If you don't receive a letter, please call our office to schedule the follow-up appointment.   If you need a refill on your cardiac medications before your next appointment, please call your pharmacy.

## 2016-02-17 DIAGNOSIS — H6983 Other specified disorders of Eustachian tube, bilateral: Secondary | ICD-10-CM | POA: Diagnosis not present

## 2016-02-17 DIAGNOSIS — H66002 Acute suppurative otitis media without spontaneous rupture of ear drum, left ear: Secondary | ICD-10-CM | POA: Diagnosis not present

## 2016-02-17 DIAGNOSIS — H6993 Unspecified Eustachian tube disorder, bilateral: Secondary | ICD-10-CM | POA: Insufficient documentation

## 2016-03-06 DIAGNOSIS — H6983 Other specified disorders of Eustachian tube, bilateral: Secondary | ICD-10-CM | POA: Diagnosis not present

## 2016-03-13 DIAGNOSIS — H353211 Exudative age-related macular degeneration, right eye, with active choroidal neovascularization: Secondary | ICD-10-CM | POA: Diagnosis not present

## 2016-03-13 DIAGNOSIS — E119 Type 2 diabetes mellitus without complications: Secondary | ICD-10-CM | POA: Diagnosis not present

## 2016-03-13 DIAGNOSIS — H353120 Nonexudative age-related macular degeneration, left eye, stage unspecified: Secondary | ICD-10-CM | POA: Diagnosis not present

## 2016-03-13 DIAGNOSIS — H40013 Open angle with borderline findings, low risk, bilateral: Secondary | ICD-10-CM | POA: Diagnosis not present

## 2016-03-15 ENCOUNTER — Encounter (INDEPENDENT_AMBULATORY_CARE_PROVIDER_SITE_OTHER): Payer: Medicare Other | Admitting: Ophthalmology

## 2016-03-15 DIAGNOSIS — H43813 Vitreous degeneration, bilateral: Secondary | ICD-10-CM

## 2016-03-15 DIAGNOSIS — H35033 Hypertensive retinopathy, bilateral: Secondary | ICD-10-CM | POA: Diagnosis not present

## 2016-03-15 DIAGNOSIS — E113311 Type 2 diabetes mellitus with moderate nonproliferative diabetic retinopathy with macular edema, right eye: Secondary | ICD-10-CM

## 2016-03-15 DIAGNOSIS — H353231 Exudative age-related macular degeneration, bilateral, with active choroidal neovascularization: Secondary | ICD-10-CM

## 2016-03-15 DIAGNOSIS — I10 Essential (primary) hypertension: Secondary | ICD-10-CM

## 2016-03-15 DIAGNOSIS — E113392 Type 2 diabetes mellitus with moderate nonproliferative diabetic retinopathy without macular edema, left eye: Secondary | ICD-10-CM | POA: Diagnosis not present

## 2016-03-15 DIAGNOSIS — E11311 Type 2 diabetes mellitus with unspecified diabetic retinopathy with macular edema: Secondary | ICD-10-CM

## 2016-03-29 IMAGING — MR MR HEAD W/O CM
10 of 11 series · 35 of 48 positions shown · non-contrast
Comparison: Head CT same day.  MRI 01/03/2013.

CLINICAL DATA: Right-sided facial droop beginning yesterday.
Slurred speech.

EXAM:
MRI HEAD WITHOUT CONTRAST
TECHNIQUE: Multiplanar, multiecho pulse sequences of the brain and surrounding
structures were obtained without intravenous contrast.

[Series 3: T1 · sagittal · 5.0mm · 0.47mm/px · 3 of 24 slices shown]
[im 1/24]
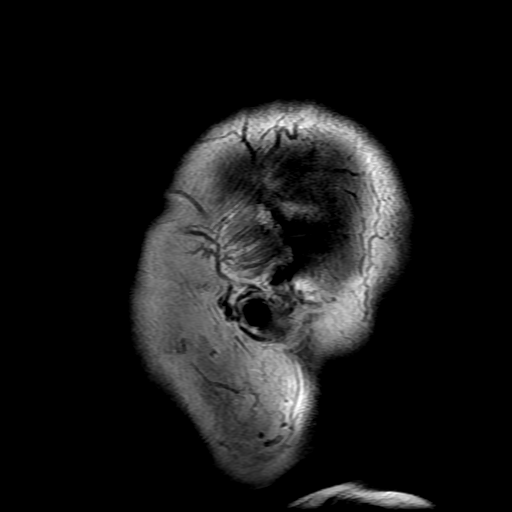
[im 12/24]
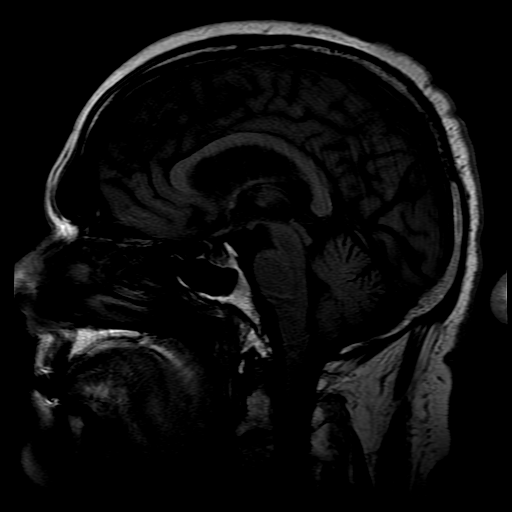
[im 24/24]
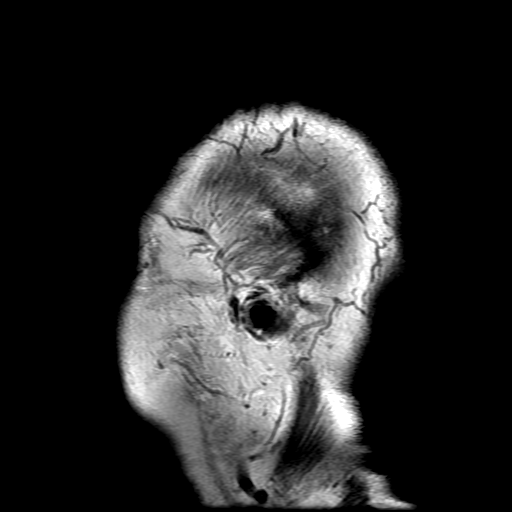

[Series 4: DWI · axial · 3.0mm · 1.09mm/px · z∈[-10,+122]mm · 9 of 92 slices shown (1 of 4)]
[im 1/92]
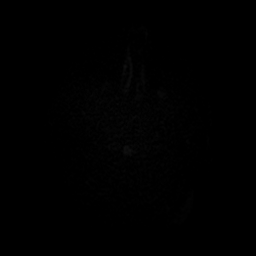
[im 12/92]
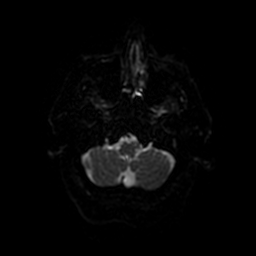
[im 23/92]
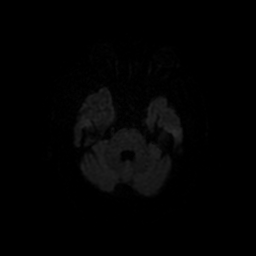
[im 35/92]
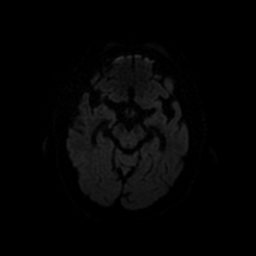
[im 46/92]
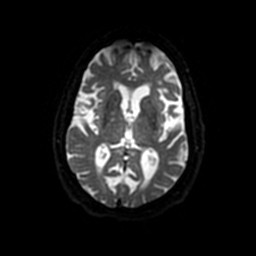
[im 57/92]
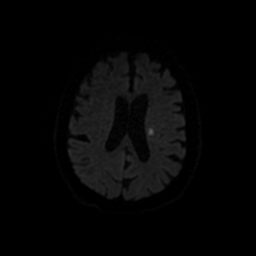
[im 69/92]
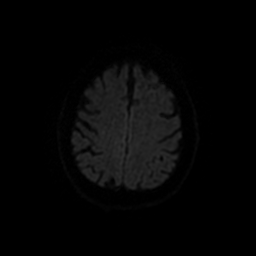
[im 80/92]
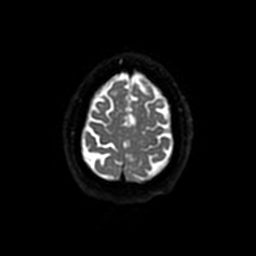
[im 92/92]
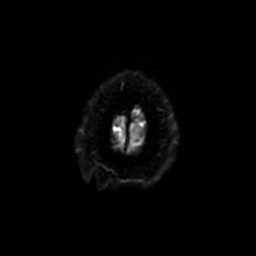

[Series 5: DWI · coronal · 5.0mm · 1.09mm/px · 6 of 68 slices shown (2 of 4)]
[im 1/68]
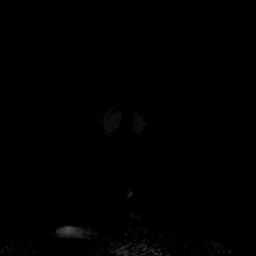
[im 14/68]
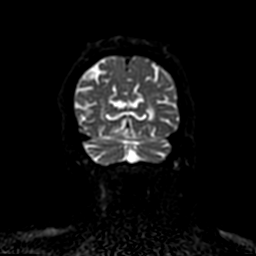
[im 27/68]
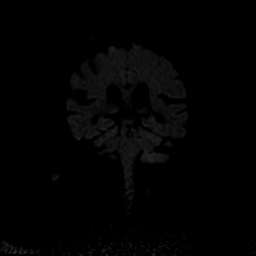
[im 41/68]
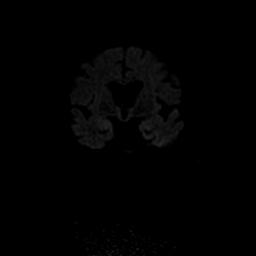
[im 54/68]
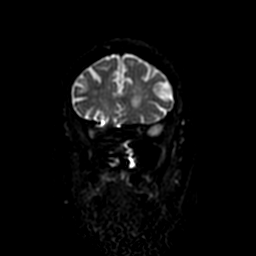
[im 68/68]
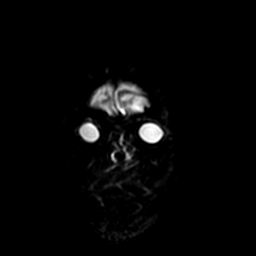

[Series 7: T2 · axial · 5.0mm · 0.47mm/px · z∈[-24,+117]mm · 2 of 25 slices shown (1 of 2)]
[im 1/25]
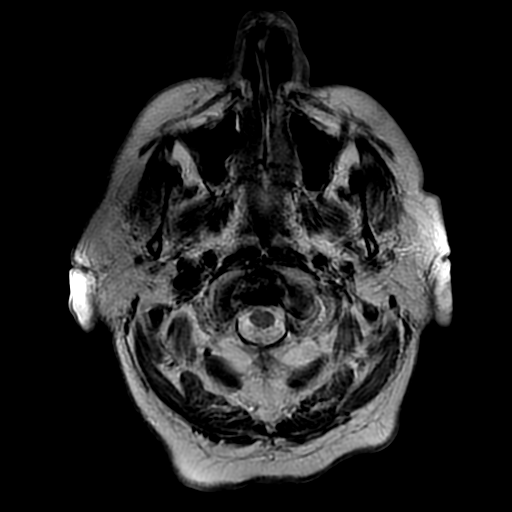
[im 25/25]
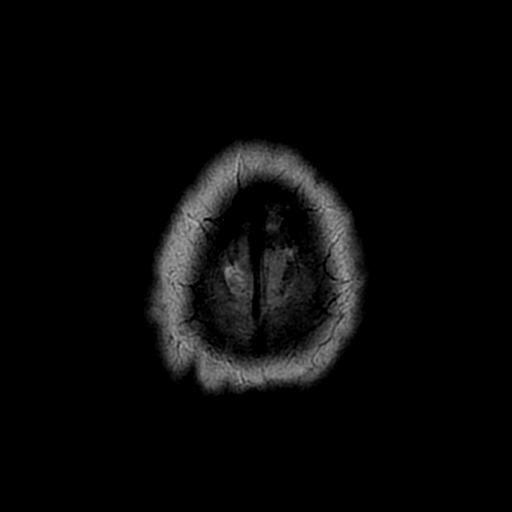

[Series 8: FLAIR · axial · 5.0mm · 0.47mm/px · z∈[-24,+117]mm · 2 of 25 slices shown]
[im 1/25]
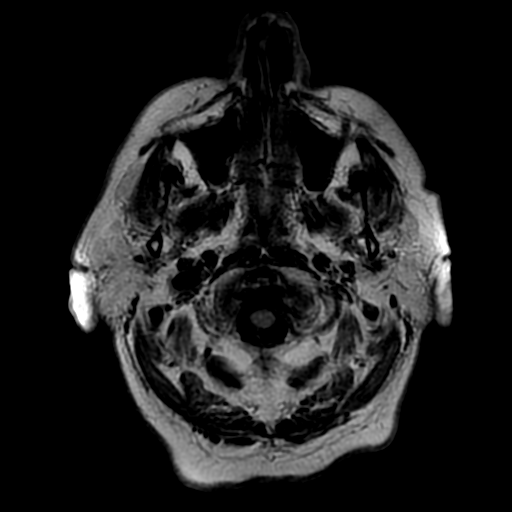
[im 25/25]
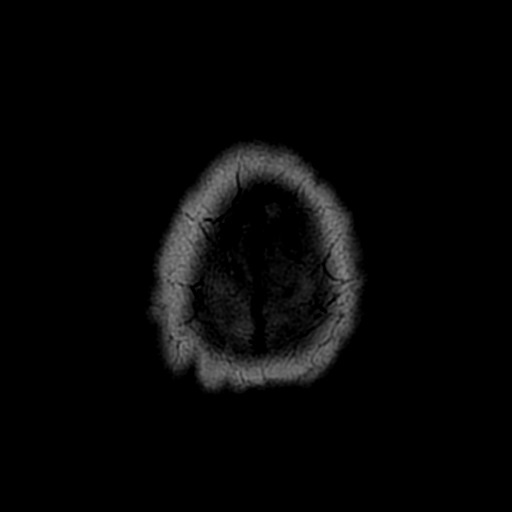

[Series 9: ax mpgr · axial · 5.0mm · 0.47mm/px · z∈[-21,+114]mm · 2 of 24 slices shown]
[im 1/24]
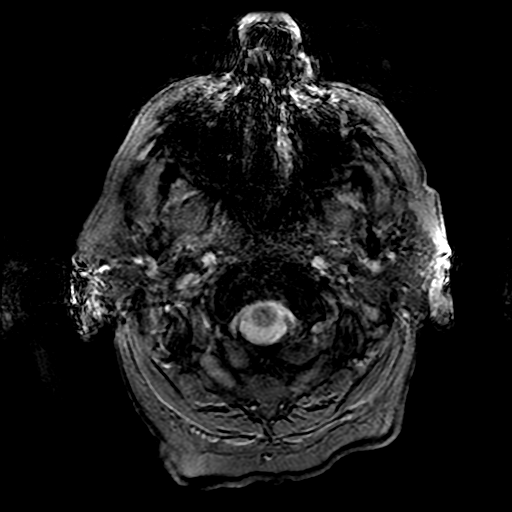
[im 24/24]
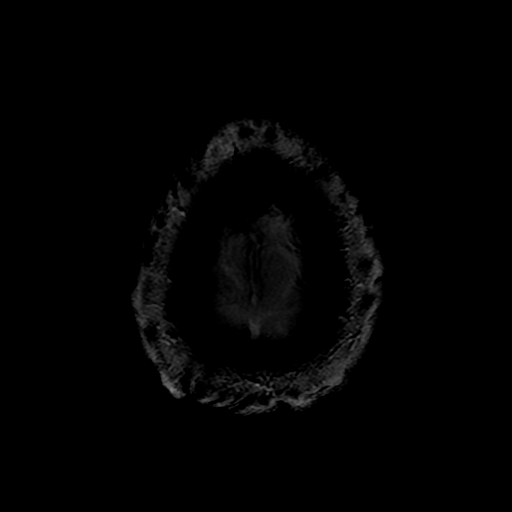

[Series 10: ax fspgr irp · axial · 3.0mm · 0.47mm/px · 1 of 48 slices shown]
[im 1/48]
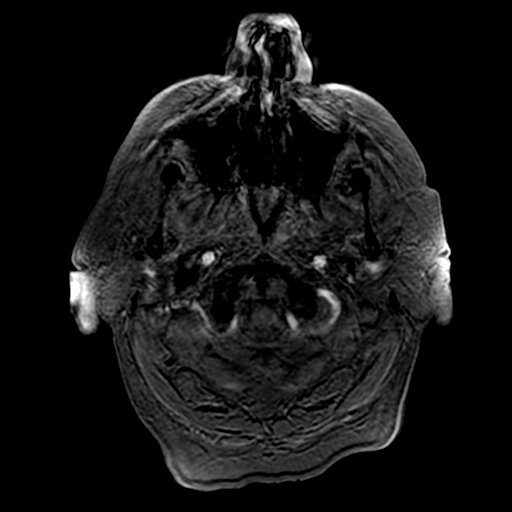

[Series 11: T2 · coronal · 5.0mm · 0.47mm/px · 3 of 27 slices shown (2 of 2)]
[im 1/27]
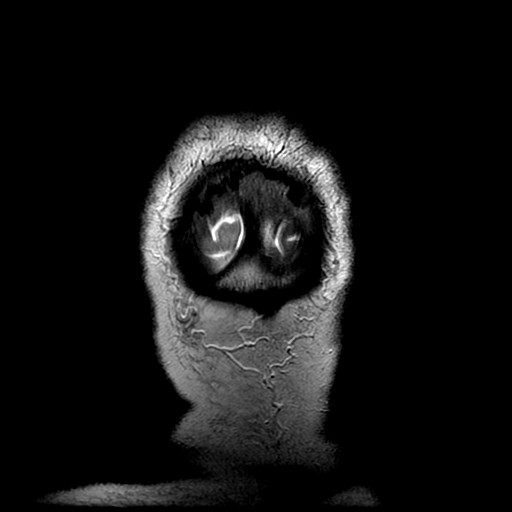
[im 14/27]
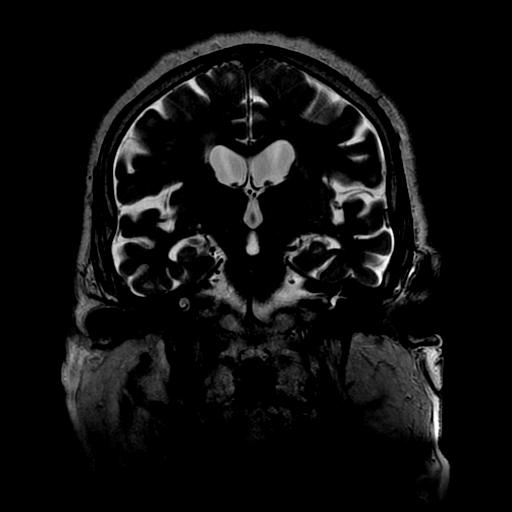
[im 27/27]
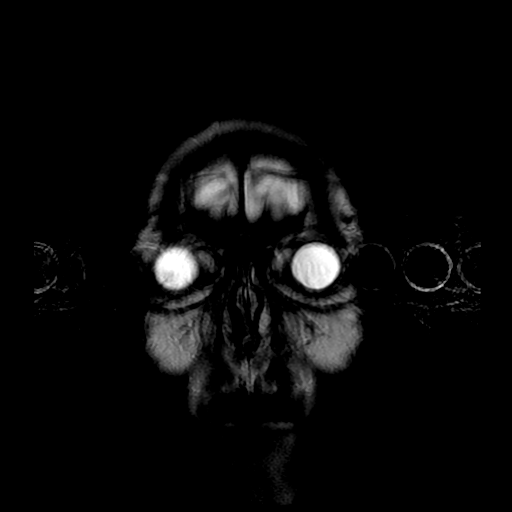

[Series 401: DWI · axial · 3.0mm · 1.09mm/px · z∈[-10,+122]mm · 4 of 46 slices shown (3 of 4)]
[im 1/46]
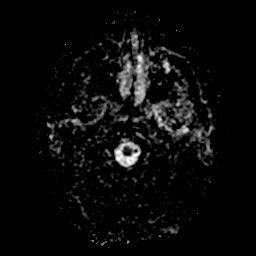
[im 16/46]
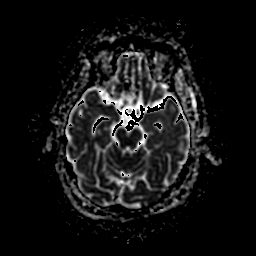
[im 31/46]
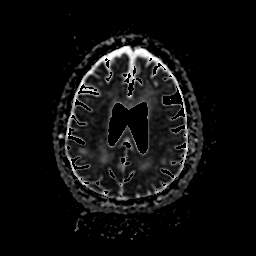
[im 46/46]
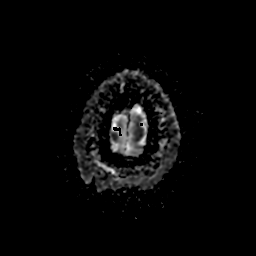

[Series 501: DWI · coronal · 5.0mm · 1.09mm/px · 3 of 34 slices shown (4 of 4)]
[im 1/34]
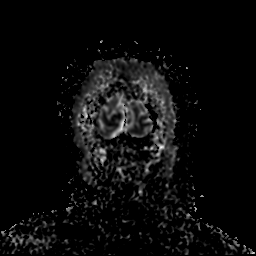
[im 17/34]
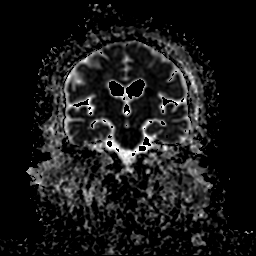
[im 34/34]
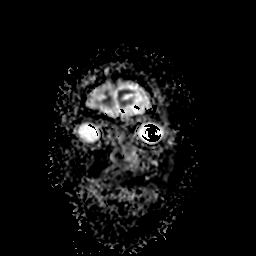

[35 of 48 positions shown; findings below may reference images not displayed]

FINDINGS: There is a sub cm region of acute infarction in the right basal
ganglia and corona radiata. No other acute infarction.

The brainstem and cerebellum are unremarkable. There chronic
small-vessel ischemic changes affecting the cerebral deep and
subcortical white matter. No large vessel territory infarction. No
mass lesion, hemorrhage, hydrocephalus or extra-axial collection. No
pituitary mass. No inflammatory sinus disease. No skull or skullbase
lesion.
IMPRESSION: Acute sub cm infarction affecting left basal ganglia/ corona
radiata. No swelling or hemorrhage.

Chronic small vessel ischemic changes elsewhere throughout the
brain.

## 2016-04-04 ENCOUNTER — Ambulatory Visit (INDEPENDENT_AMBULATORY_CARE_PROVIDER_SITE_OTHER): Payer: Medicare Other | Admitting: Nurse Practitioner

## 2016-04-04 ENCOUNTER — Encounter: Payer: Self-pay | Admitting: Nurse Practitioner

## 2016-04-04 VITALS — BP 154/75 | HR 80 | Ht 69.0 in | Wt 210.2 lb

## 2016-04-04 DIAGNOSIS — I638 Other cerebral infarction: Secondary | ICD-10-CM

## 2016-04-04 DIAGNOSIS — I6389 Other cerebral infarction: Secondary | ICD-10-CM

## 2016-04-04 DIAGNOSIS — E785 Hyperlipidemia, unspecified: Secondary | ICD-10-CM | POA: Diagnosis not present

## 2016-04-04 DIAGNOSIS — I639 Cerebral infarction, unspecified: Secondary | ICD-10-CM | POA: Diagnosis not present

## 2016-04-04 DIAGNOSIS — I6381 Other cerebral infarction due to occlusion or stenosis of small artery: Secondary | ICD-10-CM

## 2016-04-04 DIAGNOSIS — I10 Essential (primary) hypertension: Secondary | ICD-10-CM

## 2016-04-04 NOTE — Progress Notes (Signed)
[facility]  PATIENT: Kristopher Zimmerman DOB: 1924-02-24   REASON FOR VISIT: Follow-up for hemiparesis and history of recurrent stroke 12/ 2016 HISTORY FROM: Patient and wife    HISTORY OF PRESENT ILLNESS:82 y.o. male with a history of dm, htn who presented with slurred speech and increased falls since 11/27/14 He injured his toe fairly badly the day prior and was in the ER. He had taken his evening medications prior to coming in, so his wife did not think anything of the slurred speech and it was not mentioned to ER staff. CT scan of the head showed mild generalized atrophy and small vessel ischemic changes. MRI scan of the brain showed an acute sub-centimeter left basal ganglia/corona radiata infarct. MRA of the brain did not reveal large vessel stenosis. Carotid ultrasound showed no significant extracranial stenosis. Transthoracic echo showed normal ejection fraction of embolism. LDL cholesterol was normal at 43 mg percent. Hemoglobin A1c was slightly elevated at 7.0. Vascular risk factors identified included hypertension, diabetes and hyperlipidemia. Patient was seen by physical occupational speech therapy. He was started on aspirin 325 mg daily and advise aggressive risk factor management. Patient hast done well since discharge. His speech and has improved but he still has mild right facial droop. His right leg weakness is improved. His blood pressure is better controlled after additional metoprolol and is 116/65 today. He has no new complaints. He has mild lower extremity paresthesia [place]. 3. Diabetic neuropathy. He states his sugars are well controlled and hemoglobin A1c has not been repeated. Update 2/1/2017PS : He returns for follow-up after [facility]. He was admitted to [facility] in December 2016 with a right pontine lacunar infarct. He had presented with facial droop and left-sided weakness. I have personally reviewed imaging studies and hospital  stroke related testing. MRA of the brain and neck but did not show significant stenosis. Carotid ultrasound was unremarkable. LDL cholesterol was 42 mg percent and hemoglobin A1c was 7.4. Patient was started on Plavix and states is tolerating it well. He is able to walk with a walker. He states he is learned to be careful and get up slowly. His balance is still not back to normal but his walking more confidently with a walker. He does get tired. He has to rest after walking short distances. He states his blood pressure control is good and it is 138/68 today in office. His tolerating Plavix well without bleeding or bruising. He is currently getting a home physical and occupational therapy. He is had no major falls. UPDATE 05/03/2017CM Kristopher Zimmerman returns for follow-up with his wife for history of stroke in April 2016 in December 2016. He is currently taking Plavix with minimal bruising. He has not had further stroke or TIA episodes. He was getting some physical therapy however that concluded on April 17 and he continues to do his home exercise program. He is currently walking with a walker. He has had one fall and one  near fall since  last seen. Reviewed blood pressure logs over the last 2 weeks and his blood pressure control is good. He is independent with feeding dressing and bathing with minimal assistance. He is sleeping well at night. Appetite is good. He returns for reevaluation  UPDATE 9/5/ 2017CM Kristopher Zimmerman, 82 year old male returns for follow-up. He has history of stroke event in April and December 2016. He is currently on Plavix with minimal bruising and he has not had further stroke or TIA symptoms symptoms. Blood  pressure is well controlled wife keeps a log. He continues to do his home exercise program he ambulates with a rolling walker. He has had a couple of falls without injury since last seen. He is independent with feeding dressing and bathing was minimal assistance appetite is good. He is sleeping  well at night he returns for reevaluation REVIEW OF SYSTEMS: Full 14 system review of systems performed and notable only for those listed, all others are neg:  Constitutional: Fatigue  Cardiovascular: neg Ear/Nose/Throat: neg  Skin: neg Eyes: neg Respiratory: neg Gastroitestinal: neg  Hematology/Lymphatic: neg  Endocrine: Intolerance to cold Musculoskeletal: Back pain walking difficulty Allergy/Immunology: neg Neurological: neg Psychiatric: Depression Sleep : neg   ALLERGIES: No Known Allergies  HOME MEDICATIONS: Outpatient Medications Prior to Visit  Medication Sig Dispense Refill  . amLODipine (NORVASC) 2.5 MG tablet Take 2.5 mg by mouth daily.  0  . B Complex-C (B-COMPLEX WITH VITAMIN C) tablet Take 1 tablet by mouth daily.    . calcium carbonate (OS-CAL) 600 MG TABS Take 600 mg by mouth daily with breakfast.     . Carboxymethylcellulose Sodium (REFRESH LIQUIGEL OP) Apply 1 drop to eye 4 (four) times daily.    . clopidogrel (PLAVIX) 75 MG tablet Take 1 tablet (75 mg total) by mouth daily. 30 tablet 1  . cyclobenzaprine (FLEXERIL) 10 MG tablet Take 10 mg by mouth at bedtime.    . DULoxetine (CYMBALTA) 30 MG capsule Take 30 mg by mouth daily. Take with a 60 to make 90    . DULoxetine (CYMBALTA) 60 MG capsule Take 60 mg by mouth daily. Take with 30 to make 90    . glyBURIDE-metformin (GLUCOVANCE) 2.5-500 MG per tablet Take 1 tablet by mouth 2 (two) times daily with a meal. 2 tabs at breakfast and 1 at Supper    . hydrochlorothiazide (HYDRODIURIL) 25 MG tablet Take 25 mg by mouth daily.    . irbesartan (AVAPRO) 150 MG tablet Take 150 mg by mouth 2 (two) times daily.     Marland Kitchen LORazepam (ATIVAN) 2 MG tablet Take 2 mg by mouth at bedtime.    . Magnesium Citrate 100 MG TABS Take 1 tablet by mouth daily.    . meloxicam (MOBIC) 15 MG tablet Take 15 mg by mouth at bedtime.    . metoprolol tartrate (LOPRESSOR) 25 MG tablet Take 0.5 tablets (12.5 mg total) by mouth 2 (two) times daily. 60  tablet 1  . mirabegron ER (MYRBETRIQ) 25 MG TB24 tablet Take 25 mg by mouth daily.    . Multiple Vitamins-Minerals (PRESERVISION AREDS 2) CAPS Take by mouth.    . pramipexole (MIRAPEX) 0.5 MG tablet Take 0.5 mg by mouth at bedtime.     . pravastatin (PRAVACHOL) 40 MG tablet Take 40 mg by mouth daily.    Marland Kitchen Propylene Glycol (SYSTANE BALANCE) 0.6 % SOLN Apply 1 drop to eye 4 (four) times daily.    . tamsulosin (FLOMAX) 0.4 MG CAPS Take by mouth.    . vitamin C (ASCORBIC ACID) 500 MG tablet Take 500 mg by mouth daily.     No facility-administered medications prior to visit.     PAST MEDICAL HISTORY: Past Medical History:  Diagnosis Date  . Diabetes mellitus without complication ([place])   . Hyperlipidemia   . Hypertension   . Macular degeneration of right eye   . Neuropathy ([place])   . Neuropathy, diabetic ([place])   . Renal insufficiency   . Stroke [facility]) 11-28-2014   Also 07-25-2015  PAST SURGICAL HISTORY: Past Surgical History:  Procedure Laterality Date  . APPENDECTOMY    . BACK SURGERY    . Broken leg    . CERVICAL DISC SURGERY    . TONSILLECTOMY      FAMILY HISTORY: Family History  Problem Relation Age of Onset  . Cirrhosis Father   . Diabetes Brother   . Cancer - Lung Sister   . Alzheimer's disease Sister   . Heart disease Sister   . Cancer - Lung Brother     SOCIAL HISTORY: Social History   Social History  . Marital status: Married    Spouse name: N/A  . Number of children: N/A  . Years of education: N/A   Occupational History  . Not on file.   Social History Main Topics  . Smoking status: Former Smoker    Quit date: 12/22/1979  . Smokeless tobacco: Never Used  . Alcohol use 0.6 oz/week    1 Glasses of wine per week     Comment: wine  . Drug use: No  . Sexual activity: Not on file   Other Topics Concern  . Not on file   Social History Narrative   Lives at home with wife.  3 grown kids.  Retired.  Caffeine none.       PHYSICAL EXAM  Vitals:    04/04/16 1348  BP: (!) 154/75  Pulse: 80  Weight: 210 lb 3.2 oz (95.3 kg)  Height: 5\' 9"  (1.753 m)   Body mass index is 31.04 kg/m. General: well developed, well nourished Obese elderly male, seated, in no evident distress Head: head normocephalic and atraumatic.  Neck: supple with no carotid  bruits Cardiovascular: regular rate and rhythm, no murmurs Musculoskeletal: no deformity Skin: no rash/petichiae Vascular: Normal pulses all extremities Neurological examination   Mentation:Awake and fully alert. Oriented to place and time. Recent and remote memory intact. Attention span, concentration and fund of knowledge appropriate. Mood and affect appropriate.  Cranial Nerves: Pupils equal, briskly reactive to light. Extraocular movements full without nystagmus. Visual fields full to confrontation. Hearing intact. Facial sensation intact. Mild right lower face weakness but, tongue, palate moves normally and symmetrically.  Motor: Normal bulk and tone. Normal strength in all tested extremity muscles. Except mild bilateral ankle dorsiflexor  weakness. Diminished fine finger movements on the right.  Sensory.: intact to touch ,pinprick But diminished .position and vibratory sensation over toes bilaterally.  Coordination: Rapid alternating movements normal in all extremities. Finger-to-nose and heel-to-shin performed accurately bilaterally. Gait and Station: Arises from chair without difficulty. Stance is stooped. Uses a walker.. Gait demonstrates normal stride length and balance .  Reflexes: 1+ and symmetric except ankle jerks are absent.. Toes downgoing.    DIAGNOSTIC DATA (LABS, IMAGING, TESTING) - I reviewed patient records, labs, notes, testing and imaging myself where available.  Lab Results  Component Value Date   WBC 6.7 07/25/2015   HGB 11.6 (L) 07/25/2015   HCT 34.0 (L) 07/25/2015   MCV 93.8 07/25/2015   PLT 195 07/25/2015      Component Value Date/Time   NA 137 07/26/2015  1115   K 4.4 07/26/2015 1115   CL 99 (L) 07/26/2015 1115   CO2 28 07/26/2015 1115   GLUCOSE 173 (H) 07/26/2015 1115   BUN 32 (H) 07/26/2015 1115   CREATININE 1.35 (H) 07/26/2015 1115   CALCIUM 10.1 07/26/2015 1115   PROT 6.3 (L) 07/25/2015 1606   ALBUMIN 3.6 07/25/2015 1606   AST 25 07/25/2015 1606  ALT 18 07/25/2015 1606   ALKPHOS 78 07/25/2015 1606   BILITOT 0.5 07/25/2015 1606   GFRNONAA 45 (L) 07/26/2015 1115   GFRAA 52 (L) 07/26/2015 1115   Lab Results  Component Value Date   CHOL 103 07/26/2015   HDL 36 (L) 07/26/2015   LDLCALC 42 07/26/2015   TRIG 124 07/26/2015   CHOLHDL 2.9 07/26/2015   Lab Results  Component Value Date   HGBA1C 7.4 (H) 07/26/2015    ASSESSMENT AND PLAN 82 year male with left basal ganglia subcortical infarct secondary to small vessel disease in April 2016 with vascular risk factors of diabetes and hyperlipidemia. New right pontine lacunar infarct in December 2016 here for follow-up visit he has not had further stroke or TIA symptoms .The patient is a current patient of Dr. Leonie Man who is out of the office today . This note is sent to the work in doctor.     PLAN: Continue exercising to continue to improve strength, and use rolling walker to prevent falls Continue Plavix for secondary stroke prevention Continue Pravachol for cholesterol LDL less than 70 Hemoglobin A1c less than 6.5 Follow-up in 6 months Dennie Bible, [facility], [facility], APRN  [facility] [address] [place], [place] [address] [phone]

## 2016-04-04 NOTE — Patient Instructions (Addendum)
Continue exercising to continue to improve strength, and use rolling walker to prevent falls Continue Plavix for secondary stroke prevention Continue Pravachol for cholesterol LDL less than 70 Hemoglobin A1c less than 6.5 Follow-up in 6 months

## 2016-04-04 NOTE — Progress Notes (Signed)
I reviewed above note and agree with the assessment and plan.  Rosalin Hawking, MD PhD Stroke Neurology 04/04/2016 5:46 PM

## 2016-04-18 DIAGNOSIS — E119 Type 2 diabetes mellitus without complications: Secondary | ICD-10-CM | POA: Diagnosis not present

## 2016-04-25 DIAGNOSIS — Z23 Encounter for immunization: Secondary | ICD-10-CM | POA: Diagnosis not present

## 2016-04-25 DIAGNOSIS — E1165 Type 2 diabetes mellitus with hyperglycemia: Secondary | ICD-10-CM | POA: Diagnosis not present

## 2016-04-25 DIAGNOSIS — E782 Mixed hyperlipidemia: Secondary | ICD-10-CM | POA: Diagnosis not present

## 2016-04-25 DIAGNOSIS — E1142 Type 2 diabetes mellitus with diabetic polyneuropathy: Secondary | ICD-10-CM | POA: Diagnosis not present

## 2016-05-06 ENCOUNTER — Emergency Department (HOSPITAL_COMMUNITY): Payer: Medicare Other

## 2016-05-06 ENCOUNTER — Encounter (HOSPITAL_COMMUNITY): Payer: Self-pay | Admitting: Emergency Medicine

## 2016-05-06 ENCOUNTER — Emergency Department (HOSPITAL_COMMUNITY)
Admission: EM | Admit: 2016-05-06 | Discharge: 2016-05-06 | Disposition: A | Discharge status: Home / Self Care | Payer: Medicare Other | Attending: Emergency Medicine | Admitting: Emergency Medicine

## 2016-05-06 DIAGNOSIS — R531 Weakness: Secondary | ICD-10-CM

## 2016-05-06 DIAGNOSIS — Z8673 Personal history of transient ischemic attack (TIA), and cerebral infarction without residual deficits: Secondary | ICD-10-CM | POA: Diagnosis not present

## 2016-05-06 DIAGNOSIS — Z7902 Long term (current) use of antithrombotics/antiplatelets: Secondary | ICD-10-CM | POA: Diagnosis not present

## 2016-05-06 DIAGNOSIS — Z87891 Personal history of nicotine dependence: Secondary | ICD-10-CM | POA: Insufficient documentation

## 2016-05-06 DIAGNOSIS — Z7984 Long term (current) use of oral hypoglycemic drugs: Secondary | ICD-10-CM | POA: Diagnosis not present

## 2016-05-06 DIAGNOSIS — E1122 Type 2 diabetes mellitus with diabetic chronic kidney disease: Secondary | ICD-10-CM | POA: Insufficient documentation

## 2016-05-06 DIAGNOSIS — R404 Transient alteration of awareness: Secondary | ICD-10-CM | POA: Diagnosis not present

## 2016-05-06 DIAGNOSIS — N189 Chronic kidney disease, unspecified: Secondary | ICD-10-CM | POA: Insufficient documentation

## 2016-05-06 DIAGNOSIS — M6281 Muscle weakness (generalized): Secondary | ICD-10-CM | POA: Insufficient documentation

## 2016-05-06 DIAGNOSIS — I129 Hypertensive chronic kidney disease with stage 1 through stage 4 chronic kidney disease, or unspecified chronic kidney disease: Secondary | ICD-10-CM | POA: Insufficient documentation

## 2016-05-06 DIAGNOSIS — E1140 Type 2 diabetes mellitus with diabetic neuropathy, unspecified: Secondary | ICD-10-CM | POA: Insufficient documentation

## 2016-05-06 DIAGNOSIS — R930 Abnormal findings on diagnostic imaging of skull and head, not elsewhere classified: Secondary | ICD-10-CM | POA: Diagnosis not present

## 2016-05-06 DIAGNOSIS — G459 Transient cerebral ischemic attack, unspecified: Secondary | ICD-10-CM

## 2016-05-06 HISTORY — DX: Transient cerebral ischemic attack, unspecified: G45.9

## 2016-05-06 LAB — I-STAT CHEM 8, ED
BUN: 34 mg/dL — AB (ref 6–20)
CHLORIDE: 101 mmol/L (ref 101–111)
Calcium, Ion: 1.28 mmol/L (ref 1.15–1.40)
Creatinine, Ser: 1.6 mg/dL — ABNORMAL HIGH (ref 0.61–1.24)
Glucose, Bld: 141 mg/dL — ABNORMAL HIGH (ref 65–99)
HEMATOCRIT: 32 % — AB (ref 39.0–52.0)
Hemoglobin: 10.9 g/dL — ABNORMAL LOW (ref 13.0–17.0)
POTASSIUM: 4.9 mmol/L (ref 3.5–5.1)
SODIUM: 139 mmol/L (ref 135–145)
TCO2: 26 mmol/L (ref 0–100)

## 2016-05-06 LAB — I-STAT TROPONIN, ED: Troponin i, poc: 0 ng/mL (ref 0.00–0.08)

## 2016-05-06 NOTE — ED Provider Notes (Addendum)
[facility] DEPT Provider Note   CSN: [id number] Arrival date & time: 05/06/16  1048     History   Chief Complaint Chief Complaint  Patient presents with  . Weakness    HPI Kristopher Zimmerman is a 82 y.o. male.  HPI Pt has history of stroke around christmas last year.  He has some chronic issues with walking since then but does OK with a walker.  This am he felt weak and unsteady.    He had gotten up to use the bathroom and he was walking slower than normal.  He was not feeling exactly like normal. No LOC.His legs gave out and he slid down on the floor at 0855.  He was able to get up and shower but had to use the seat in the shower.   Past Medical History:  Diagnosis Date  . Diabetes mellitus without complication ([place])   . Hyperlipidemia   . Hypertension   . Macular degeneration of right eye   . Neuropathy ([place])   . Neuropathy, diabetic ([place])   . Renal insufficiency   . Stroke [facility]) 11-28-2014   Also 07-25-2015    Patient Active Problem List   Diagnosis Date Noted  . 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 HLD (hyperlipidemia)   . Acute CVA (cerebrovascular accident) ([place])   . Lacunar infarct, acute ([place]) 03/02/2015  . Diabetic neuropathy ([place]) 03/02/2015  . Stroke ([place]) 11/28/2014  . CVA (cerebral infarction) 11/28/2014  . Diabetes mellitus ([place]) 11/28/2014  . Chronic kidney disease (CKD) stage G2/A1, mildly decreased glomerular filtration rate (GFR) between 60-89 mL/min/1.73 square meter and albuminuria creatinine ratio less than 30 mg/g 11/28/2014  . Neuropathy in diabetes ([place]) 11/28/2014  . Toe fracture, left 11/28/2014  . DM (diabetes mellitus) type II controlled with renal manifestation ([place]) 11/28/2014  . Essential hypertension 05/27/2014  . Hyperlipidemia 05/27/2014  . Aortic stenosis 05/27/2014  . Abnormal electrocardiogram 05/27/2014  . Chest pain 05/27/2014    Past Surgical History:  Procedure Laterality Date  . APPENDECTOMY    . BACK SURGERY     . Broken leg    . CERVICAL DISC SURGERY    . TONSILLECTOMY         Home Medications    Prior to Admission medications   Medication Sig Start Date End Date Taking? Authorizing Provider  amLODipine (NORVASC) 2.5 MG tablet Take 2.5 mg by mouth daily. 11/19/15   Historical Provider, MD  B Complex-C (B-COMPLEX WITH VITAMIN C) tablet Take 1 tablet by mouth daily.    Historical Provider, MD  calcium carbonate (OS-CAL) 600 MG TABS Take 600 mg by mouth daily with breakfast.     Historical Provider, MD  Carboxymethylcellulose Sodium (REFRESH LIQUIGEL OP) Apply 1 drop to eye 4 (four) times daily.    Historical Provider, MD  clopidogrel (PLAVIX) 75 MG tablet Take 1 tablet (75 mg total) by mouth daily. 07/26/15   Silver Huguenin Elgergawy, MD  cyclobenzaprine (FLEXERIL) 10 MG tablet Take 10 mg by mouth at bedtime.    Historical Provider, MD  DULoxetine (CYMBALTA) 30 MG capsule Take 30 mg by mouth daily. Take with a 60 to make 90    Historical Provider, MD  DULoxetine (CYMBALTA) 60 MG capsule Take 60 mg by mouth daily. Take with 30 to make 90    Historical Provider, MD  glyBURIDE-metformin (GLUCOVANCE) 2.5-500 MG per tablet Take 1 tablet by mouth 2 (two) times daily with a meal. 2 tabs at breakfast and 1 at [employer]  Historical Provider, MD  hydrochlorothiazide (HYDRODIURIL) 25 MG tablet Take 25 mg by mouth daily.    Historical Provider, MD  irbesartan (AVAPRO) 150 MG tablet Take 150 mg by mouth 2 (two) times daily.     Historical Provider, MD  LORazepam (ATIVAN) 2 MG tablet Take 2 mg by mouth at bedtime.    Historical Provider, MD  Magnesium Citrate 100 MG TABS Take 1 tablet by mouth daily.    Historical Provider, MD  meloxicam (MOBIC) 15 MG tablet Take 15 mg by mouth at bedtime.    Historical Provider, MD  metoprolol tartrate (LOPRESSOR) 25 MG tablet Take 0.5 tablets (12.5 mg total) by mouth 2 (two) times daily. 11/30/14   Orson Eva, MD  mirabegron ER (MYRBETRIQ) 25 MG TB24 tablet Take 25 mg by mouth daily.     Merrilee Seashore, MD  Multiple Vitamins-Minerals (PRESERVISION AREDS 2) CAPS Take by mouth.    Historical Provider, MD  pramipexole (MIRAPEX) 0.5 MG tablet Take 0.5 mg by mouth at bedtime.     Historical Provider, MD  pravastatin (PRAVACHOL) 40 MG tablet Take 40 mg by mouth daily.    Historical Provider, MD  Propylene Glycol (SYSTANE BALANCE) 0.6 % SOLN Apply 1 drop to eye 4 (four) times daily.    Historical Provider, MD  tamsulosin (FLOMAX) 0.4 MG CAPS Take by mouth.    Historical Provider, MD  vitamin C (ASCORBIC ACID) 500 MG tablet Take 500 mg by mouth daily.    Historical Provider, MD    Family History Family History  Problem Relation Age of Onset  . Cirrhosis Father   . Diabetes Brother   . Cancer - Lung Sister   . Alzheimer's disease Sister   . Heart disease Sister   . Cancer - Lung Brother     Social History Social History  Substance Use Topics  . Smoking status: Former Smoker    Quit date: 12/22/1979  . Smokeless tobacco: Never Used  . Alcohol use 0.6 oz/week    1 Glasses of wine per week     Comment: wine     Allergies   Review of patient's allergies indicates no known allergies.   Review of Systems Review of Systems  All other systems reviewed and are negative.    Physical Exam Updated Vital Signs SpO2 97% Comment: RA  Physical Exam  Constitutional: He is oriented to person, place, and time. He appears well-developed and well-nourished. No distress.  HENT:  Head: Normocephalic and atraumatic.  Right Ear: External ear normal.  Left Ear: External ear normal.  Mouth/Throat: Oropharynx is clear and moist.  Eyes: Conjunctivae are normal. Right eye exhibits no discharge. Left eye exhibits no discharge. No scleral icterus.  Neck: Neck supple. No tracheal deviation present.  Cardiovascular: Normal rate, regular rhythm and intact distal pulses.   Pulmonary/Chest: Effort normal and breath sounds normal. No stridor. No respiratory distress. He has no wheezes.  He has no rales.  Abdominal: Soft. Bowel sounds are normal. He exhibits no distension. There is no tenderness. There is no rebound and no guarding.  Musculoskeletal: He exhibits no edema or tenderness.  Neurological: He is alert and oriented to person, place, and time. He has normal strength. No cranial nerve deficit (No facial droop, extraocular movements intact, tongue midline  , speech mildly slurred, no aphasia) or sensory deficit. He exhibits normal muscle tone. He displays no seizure activity. Coordination normal.  No pronator drift bilateral upper extrem, able to hold both legs off bed for 5  seconds, sensation intact in all extremities, no visual field cuts, no left or right sided neglect, abnormal finger-nose exam towards the left side visual fields, no nystagmus noted   Skin: Skin is warm and dry. No rash noted.  Psychiatric: He has a normal mood and affect.  Nursing note and vitals reviewed.    ED Treatments / Results  Labs (all labs ordered are listed, but only abnormal results are displayed) Labs Reviewed  I-STAT CHEM 8, ED - Abnormal; Notable for the following:       Result Value   BUN 34 (*)    Creatinine, Ser 1.60 (*)    Glucose, Bld 141 (*)    Hemoglobin 10.9 (*)    HCT 32.0 (*)    All other components within normal limits  CBC  DIFFERENTIAL  COMPREHENSIVE METABOLIC PANEL  I-STAT TROPOININ, ED    EKG  EKG Interpretation  Date/Time:  Saturday May 06 2016 10:55:08 EDT Ventricular Rate:  98 PR Interval:    QRS Duration: 154 QT Interval:  369 QTC Calculation: 472 R Axis:   -17 Text Interpretation:  Sinus rhythm Left bundle branch block new LBBB since prior tracing Confirmed by Linken Mcglothen  MD-J, Anniyah Mood ([id number]) on 05/06/2016 11:16:08 AM       Radiology Ct Head Wo Contrast  Result Date: 05/06/2016 CLINICAL DATA:  82 year old male with a history of bilateral leg weakness. EXAM: CT HEAD WITHOUT CONTRAST TECHNIQUE: Contiguous axial images were obtained from the base  of the skull through the vertex without intravenous contrast. COMPARISON:  MRI 07/25/2015, CT 07/25/2015 FINDINGS: Brain: No acute intracranial hemorrhage. No midline shift or mass effect. Gray-white differentiation relatively maintained. Focal hypodensity in the right pons, corresponding to prior infarction on MRI of December 2016. Unchanged ventricular system. Mild hypodensity in the periventricular white matter. Vascular: Vascular calcifications. Skull: No acute fracture.  No aggressive bone lesion identified. Sinuses/Orbits: Unremarkable appearance of the orbits. Mastoid air cells clear. No middle ear effusion. No significant sinus disease. Other: None IMPRESSION: No CT evidence of acute intracranial abnormality. Focal hypodensity in the right pons corresponds to infarction present on MRI of December 2016. Intracranial atherosclerosis. Signed, Dulcy Fanny. Earleen Newport, DO [facility] Radiology Electronically Signed   By: Corrie Mckusick D.O.   On: 05/06/2016 13:06   Mr Brain Wo Contrast  Result Date: 05/06/2016 CLINICAL DATA:  Bilateral leg weakness beginning this morning. EXAM: MRI HEAD WITHOUT CONTRAST TECHNIQUE: Multiplanar, multiecho pulse sequences of the brain and surrounding structures were obtained without intravenous contrast. COMPARISON:  CT same day.  MRI 07/24/2016. FINDINGS: Brain: Diffusion imaging does not show any acute or subacute infarction. There is an old infarction in the right side of the pons. Mild small-vessel change elsewhere within the pons. No focal cerebellar insult. Cerebral hemispheres show moderate changes of chronic small vessel disease affecting the deep and subcortical white matter. No mass lesion, hemorrhage, hydrocephalus or extra-axial collection. No pituitary mass. Vascular: Major vessels at the base of the brain show flow. Skull and upper cervical spine: Negative Sinuses/Orbits: Clear/normal Other: None significant IMPRESSION: No  acute finding. 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 Chronic small-vessel ischemic changes affecting the pons and cerebral hemispheric white matter. Electronically Signed   By: Nelson Chimes M.D.   On: 05/06/2016 15:41    Procedures Procedures (including critical care time)  Medications Ordered in ED Medications - No data to display   Initial Impression / Assessment and Plan / ED Course  I have reviewed the triage vital  signs and the nursing notes.  Pertinent labs & imaging results that were available during my care of the patient were reviewed by me and considered in my medical decision making (see chart for details).  Clinical Course  Comment By Time  No acute findings on CT scan.  Pt feels like his symptoms are improving.  With his history of 2 prior strokes with similar sx will get an MRI Dorie Rank, MD 10/07 1320  Istat chem 8 and troponin reviewed.  Stable compared to previous values. Dorie Rank, MD 10/07 1625  The patient's MRI does not show any acute infarction.  Patient is feeling well and would like to go home. Can't exclude the possibility of TIA although it is possible his symptoms are related to his old stroke. The patient did have an echocardiogram back in December 2016.  He also had an MRA of the neck back in 2016 that did not show any acute abnormalities. I do not feel that repeat imaging of his carotids and echocardiogram are necessary.  Patient is ready to go home. We discussed close outpatient follow-up. Return for any recurrent or symptoms.   Final Clinical Impressions(s) / ED Diagnoses   Final diagnoses:  Weakness    New Prescriptions New Prescriptions   No medications on file     Dorie Rank, MD 05/06/16 1623  Note:  Labs were ordered on presentation.  istat tests were run.  The other tests were never sent down to the lab.  Since the MRI was already back when this was discovered, labs were cancelled.      Dorie Rank, MD 05/06/16 1625    Dorie Rank, MD 05/06/16  [phone]

## 2016-05-06 NOTE — ED Triage Notes (Addendum)
Pt from home via [facility] with c/o bilateral leg weakness this morning worse than normal.  Weakness has since improved.  Pt reports this same complaint was present in Dec when he had his first CVA.  Second CVA was this past April.  ST on 4 lead.  Neg stroke screen by EMS.  LSW last night approx 2200.  NAD, A&Ox4.

## 2016-05-06 NOTE — ED Notes (Signed)
Patient able to ambulate at baseline

## 2016-05-17 ENCOUNTER — Encounter (INDEPENDENT_AMBULATORY_CARE_PROVIDER_SITE_OTHER): Payer: Medicare Other | Admitting: Ophthalmology

## 2016-05-17 DIAGNOSIS — H353231 Exudative age-related macular degeneration, bilateral, with active choroidal neovascularization: Secondary | ICD-10-CM

## 2016-05-17 DIAGNOSIS — I10 Essential (primary) hypertension: Secondary | ICD-10-CM

## 2016-05-17 DIAGNOSIS — E113293 Type 2 diabetes mellitus with mild nonproliferative diabetic retinopathy without macular edema, bilateral: Secondary | ICD-10-CM

## 2016-05-17 DIAGNOSIS — E11319 Type 2 diabetes mellitus with unspecified diabetic retinopathy without macular edema: Secondary | ICD-10-CM

## 2016-05-17 DIAGNOSIS — H35033 Hypertensive retinopathy, bilateral: Secondary | ICD-10-CM | POA: Diagnosis not present

## 2016-05-17 DIAGNOSIS — H43813 Vitreous degeneration, bilateral: Secondary | ICD-10-CM | POA: Diagnosis not present

## 2016-07-19 ENCOUNTER — Encounter (INDEPENDENT_AMBULATORY_CARE_PROVIDER_SITE_OTHER): Payer: Medicare Other | Admitting: Ophthalmology

## 2016-07-19 DIAGNOSIS — H43813 Vitreous degeneration, bilateral: Secondary | ICD-10-CM | POA: Diagnosis not present

## 2016-07-19 DIAGNOSIS — H35033 Hypertensive retinopathy, bilateral: Secondary | ICD-10-CM | POA: Diagnosis not present

## 2016-07-19 DIAGNOSIS — E113312 Type 2 diabetes mellitus with moderate nonproliferative diabetic retinopathy with macular edema, left eye: Secondary | ICD-10-CM | POA: Diagnosis not present

## 2016-07-19 DIAGNOSIS — E113211 Type 2 diabetes mellitus with mild nonproliferative diabetic retinopathy with macular edema, right eye: Secondary | ICD-10-CM

## 2016-07-19 DIAGNOSIS — E11311 Type 2 diabetes mellitus with unspecified diabetic retinopathy with macular edema: Secondary | ICD-10-CM | POA: Diagnosis not present

## 2016-07-19 DIAGNOSIS — I10 Essential (primary) hypertension: Secondary | ICD-10-CM | POA: Diagnosis not present

## 2016-07-19 DIAGNOSIS — H353231 Exudative age-related macular degeneration, bilateral, with active choroidal neovascularization: Secondary | ICD-10-CM

## 2016-08-09 DIAGNOSIS — D649 Anemia, unspecified: Secondary | ICD-10-CM | POA: Diagnosis not present

## 2016-08-09 DIAGNOSIS — E782 Mixed hyperlipidemia: Secondary | ICD-10-CM | POA: Diagnosis not present

## 2016-08-09 DIAGNOSIS — E1165 Type 2 diabetes mellitus with hyperglycemia: Secondary | ICD-10-CM | POA: Diagnosis not present

## 2016-08-09 DIAGNOSIS — I10 Essential (primary) hypertension: Secondary | ICD-10-CM | POA: Diagnosis not present

## 2016-08-09 DIAGNOSIS — Z0000 Encounter for general adult medical examination without abnormal findings: Secondary | ICD-10-CM | POA: Diagnosis not present

## 2016-08-15 DIAGNOSIS — R32 Unspecified urinary incontinence: Secondary | ICD-10-CM | POA: Diagnosis not present

## 2016-08-15 DIAGNOSIS — E782 Mixed hyperlipidemia: Secondary | ICD-10-CM | POA: Diagnosis not present

## 2016-08-15 DIAGNOSIS — E1121 Type 2 diabetes mellitus with diabetic nephropathy: Secondary | ICD-10-CM | POA: Diagnosis not present

## 2016-08-15 DIAGNOSIS — E1165 Type 2 diabetes mellitus with hyperglycemia: Secondary | ICD-10-CM | POA: Diagnosis not present

## 2016-08-15 DIAGNOSIS — Z23 Encounter for immunization: Secondary | ICD-10-CM | POA: Diagnosis not present

## 2016-09-27 ENCOUNTER — Encounter (INDEPENDENT_AMBULATORY_CARE_PROVIDER_SITE_OTHER): Payer: Medicare Other | Admitting: Ophthalmology

## 2016-09-27 DIAGNOSIS — I10 Essential (primary) hypertension: Secondary | ICD-10-CM

## 2016-09-27 DIAGNOSIS — H35033 Hypertensive retinopathy, bilateral: Secondary | ICD-10-CM | POA: Diagnosis not present

## 2016-09-27 DIAGNOSIS — H43813 Vitreous degeneration, bilateral: Secondary | ICD-10-CM | POA: Diagnosis not present

## 2016-09-27 DIAGNOSIS — H353231 Exudative age-related macular degeneration, bilateral, with active choroidal neovascularization: Secondary | ICD-10-CM

## 2016-10-03 ENCOUNTER — Ambulatory Visit (INDEPENDENT_AMBULATORY_CARE_PROVIDER_SITE_OTHER): Payer: Medicare Other | Admitting: Nurse Practitioner

## 2016-10-03 ENCOUNTER — Encounter: Payer: Self-pay | Admitting: Nurse Practitioner

## 2016-10-03 VITALS — BP 160/74 | HR 67

## 2016-10-03 DIAGNOSIS — I639 Cerebral infarction, unspecified: Secondary | ICD-10-CM

## 2016-10-03 DIAGNOSIS — E785 Hyperlipidemia, unspecified: Secondary | ICD-10-CM | POA: Diagnosis not present

## 2016-10-03 DIAGNOSIS — G459 Transient cerebral ischemic attack, unspecified: Secondary | ICD-10-CM | POA: Diagnosis not present

## 2016-10-03 DIAGNOSIS — I10 Essential (primary) hypertension: Secondary | ICD-10-CM | POA: Diagnosis not present

## 2016-10-03 DIAGNOSIS — R4781 Slurred speech: Secondary | ICD-10-CM | POA: Diagnosis not present

## 2016-10-03 DIAGNOSIS — R251 Tremor, unspecified: Secondary | ICD-10-CM

## 2016-10-03 DIAGNOSIS — I6381 Other cerebral infarction due to occlusion or stenosis of small artery: Secondary | ICD-10-CM

## 2016-10-03 NOTE — Progress Notes (Signed)
[facility]  PATIENT: Kristopher Zimmerman DOB: 12-12-1923   REASON FOR VISIT: Follow-up for hemiparesis and history of recurrent stroke 12/ 2016, recent episode of slurring of speech along with shaking of extremity which were brief HISTORY FROM: Patient and wife    HISTORY OF PRESENT ILLNESS:83 y.o. male with a history of dm, htn who presented with slurred speech and increased falls since 11/27/14 He injured his toe fairly badly the day prior and was in the ER. He had taken his evening medications prior to coming in, so his wife did not think anything of the slurred speech and it was not mentioned to ER staff. CT scan of the head showed mild generalized atrophy and small vessel ischemic changes. MRI scan of the brain showed an acute sub-centimeter left basal ganglia/corona radiata infarct. MRA of the brain did not reveal large vessel stenosis. Carotid ultrasound showed no significant extracranial stenosis. Transthoracic echo showed normal ejection fraction of embolism. LDL cholesterol was normal at 43 mg percent. Hemoglobin A1c was slightly elevated at 7.0. Vascular risk factors identified included hypertension, diabetes and hyperlipidemia. Patient was seen by physical occupational speech therapy. He was started on aspirin 325 mg daily and advise aggressive risk factor management. Patient hast done well since discharge. His speech and has improved but he still has mild right facial droop. His right leg weakness is improved. His blood pressure is better controlled after additional metoprolol and is 116/65 today. He has no new complaints. He has mild lower extremity paresthesia [place]. 3. Diabetic neuropathy. He states his sugars are well controlled and hemoglobin A1c has not been repeated. Update 2/1/2017PS : He returns for follow-up after last visit He was admitted to [facility] in December 2016 with a right pontine lacunar infarct. He had presented with facial droop and  left-sided weakness. I have personally reviewed imaging studies and hospital stroke related testing. MRA of the brain and neck but did not show significant stenosis. Carotid ultrasound was unremarkable. LDL cholesterol was 42 mg percent and hemoglobin A1c was 7.4. Patient was started on Plavix and states is tolerating it well. He is able to walk with a walker. He states he is learned to be careful and get up slowly. His balance is still not back to normal but his walking more confidently with a walker. He does get tired. He has to rest after walking short distances. He states his blood pressure control is good and it is 138/68 today in office. His tolerating Plavix well without bleeding or bruising. He is currently getting a home physical and occupational therapy. He is had no major falls. UPDATE 05/03/2017CM Kristopher Zimmerman returns for follow-up with his wife for history of stroke in April 2016 in December 2016. He is currently taking Plavix with minimal bruising. He has not had further stroke or TIA episodes. He was getting some physical therapy however that concluded on April 17 and he continues to do his home exercise program. He is currently walking with a walker. He has had one fall and one  near fall since  last seen. Reviewed blood pressure logs over the last 2 weeks and his blood pressure control is good. He is independent with feeding dressing and bathing with minimal assistance. He is sleeping well at night. Appetite is good. He returns for reevaluation  UPDATE 9/5/ 2017CM Kristopher Zimmerman, 83 year old male returns for follow-up. He has history of stroke event in April and December 2016. He is currently on Plavix with minimal bruising and  he has not had further stroke or TIA symptoms symptoms. Blood pressure is well controlled wife keeps a log. He continues to do his home exercise program he ambulates with a rolling walker. He has had a couple of falls without injury since last seen. He is independent with feeding  dressing and bathing was minimal assistance appetite is good. He is sleeping well at night he returns for reevaluation UPDATE3/6/18.CM  Kristopher Zimmerman, 83 year old male returns for follow-up. He has a history of stroke event in April and September 2016. He is currently on Plavix for secondary stroke prevention with no bruising or bleeding. Recently his wife reports that he has had some episodes of slurring of speech in association tremor or shaking of his arm. This is brief. She is not sure if it was TIA or not it could be seizure. Blood pressure office today 160/74 wife had been keeping a log but she says she has not been keeping a record recently. He  denies missing any doses of his antihypertensives. He is also on Pravachol for hyperlipidemia without complaints of myalgias he is due to see a urologist tomorrow for urinary frequency and incontinence. He also has a history of macular degeneration with recent injections He returns for reevaluation. REVIEW OF SYSTEMS: Full 14 system review of systems performed and notable only for those listed, all others are neg:  Constitutional: Fatigue  Cardiovascular: neg Ear/Nose/Throat: neg  Skin: neg Eyes: Macular degeneration Respiratory: neg Gastroitestinal: Urinary incontinence, urinary frequency Hematology/Lymphatic: neg  Endocrine: Intolerance to cold Musculoskeletal: Back pain walking difficulty Allergy/Immunology: neg Neurological: Speech difficulty and weakness tremors Psychiatric: Depression Sleep : neg   ALLERGIES: No Known Allergies  HOME MEDICATIONS: Outpatient Medications Prior to Visit  Medication Sig Dispense Refill  . amLODipine (NORVASC) 2.5 MG tablet Take 2.5 mg by mouth daily.  0  . B Complex-C (B-COMPLEX WITH VITAMIN C) tablet Take 1 tablet by mouth daily.    . calcium carbonate (OS-CAL) 600 MG TABS Take 600 mg by mouth daily with breakfast.     . Carboxymethylcellulose Sodium (REFRESH LIQUIGEL OP) Apply 1 drop to eye 4 (four) times  daily.    . clopidogrel (PLAVIX) 75 MG tablet Take 1 tablet (75 mg total) by mouth daily. 30 tablet 1  . cyclobenzaprine (FLEXERIL) 10 MG tablet Take 10 mg by mouth at bedtime.    . DULoxetine (CYMBALTA) 30 MG capsule Take 30 mg by mouth daily. Take with a 60 to make 90    . DULoxetine (CYMBALTA) 60 MG capsule Take 60 mg by mouth daily. Take with 30 to make 90    . glyBURIDE-metformin (GLUCOVANCE) 2.5-500 MG per tablet Take 1 tablet by mouth 2 (two) times daily with a meal. 2 tabs at breakfast and 1 at Supper    . hydrochlorothiazide (HYDRODIURIL) 25 MG tablet Take 25 mg by mouth daily.    . irbesartan (AVAPRO) 150 MG tablet Take 150 mg by mouth 2 (two) times daily.     Marland Kitchen LORazepam (ATIVAN) 2 MG tablet Take 2 mg by mouth at bedtime.    . Magnesium Citrate 100 MG TABS Take 1 tablet by mouth daily.    . meloxicam (MOBIC) 15 MG tablet Take 15 mg by mouth at bedtime.    . metoprolol tartrate (LOPRESSOR) 25 MG tablet Take 0.5 tablets (12.5 mg total) by mouth 2 (two) times daily. 60 tablet 1  . mirabegron ER (MYRBETRIQ) 25 MG TB24 tablet Take 25 mg by mouth daily.    Marland Kitchen  Multiple Vitamins-Minerals (PRESERVISION AREDS 2) CAPS Take by mouth.    . pramipexole (MIRAPEX) 0.5 MG tablet Take 0.5 mg by mouth at bedtime.     . pravastatin (PRAVACHOL) 40 MG tablet Take 40 mg by mouth daily.    Marland Kitchen Propylene Glycol (SYSTANE BALANCE) 0.6 % SOLN Apply 1 drop to eye 4 (four) times daily.    . tamsulosin (FLOMAX) 0.4 MG CAPS Take by mouth.    . vitamin C (ASCORBIC ACID) 500 MG tablet Take 500 mg by mouth daily.     No facility-administered medications prior to visit.     PAST MEDICAL HISTORY: Past Medical History:  Diagnosis Date  . Diabetes mellitus without complication ([place])   . Hyperlipidemia   . Hypertension   . Macular degeneration of right eye   . Neuropathy ([place])   . Neuropathy, diabetic ([place])   . Renal insufficiency   . Stroke [facility]) 11-28-2014   Also 07-25-2015  . TIA (transient ischemic attack)  05/06/2016    PAST SURGICAL HISTORY: Past Surgical History:  Procedure Laterality Date  . APPENDECTOMY    . BACK SURGERY    . Broken leg    . CERVICAL DISC SURGERY    . TONSILLECTOMY      FAMILY HISTORY: Family History  Problem Relation Age of Onset  . Cirrhosis Father   . Diabetes Brother   . Cancer - Lung Sister   . Alzheimer's disease Sister   . Heart disease Sister   . Cancer - Lung Brother     SOCIAL HISTORY: Social History   Social History  . Marital status: Married    Spouse name: Vicky  . Number of children: 3  . Years of education: N/A   Occupational History  . Not on file.   Social History Main Topics  . Smoking status: Former Smoker    Quit date: 12/22/1979  . Smokeless tobacco: Never Used  . Alcohol use 0.6 oz/week    1 Glasses of wine per week     Comment: wine  . Drug use: No  . Sexual activity: Not on file   Other Topics Concern  . Not on file   Social History Narrative   Lives at home with wife.  3 grown kids.  Retired.  Caffeine none.       PHYSICAL EXAM  Vitals:   10/03/16 1352  BP: (!) 160/74  Pulse: 67   There is no height or weight on file to calculate BMI. General: well developed, well nourished Obese elderly male, seated, in no evident distress Head: head normocephalic and atraumatic.  Neck: supple with no carotid  bruits Cardiovascular: regular rate and rhythm, no murmurs Musculoskeletal: no deformity  Neurological examination   Mentation:Awake and fully alert. Oriented to place and time. Recent and remote memory intact. Attention span, concentration and fund of knowledge appropriate. Mood and affect appropriate.  Cranial Nerves: Pupils equal, briskly reactive to light. Extraocular movements full without nystagmus. Visual fields full to confrontation. Hearing intact. Facial sensation intact. Mild right lower face weakness but, tongue, palate moves normally and symmetrically.  Motor: Normal bulk and tone. Normal strength  in all tested extremity muscles, except mild bilateral ankle dorsiflexor  weakness. Diminished fine finger movements on the right.  Sensory.: intact to touch ,pinprick but diminished .position and vibratory sensation over toes bilaterally.  Coordination: Rapid alternating movements normal in all extremities. Finger-to-nose and heel-to-shin performed accurately bilaterally. Gait and Station: Arises from chair without difficulty. Stance is stooped. Uses a  walker.. Gait demonstrates normal stride length and balance .  Reflexes: 1+ and symmetric except ankle jerks are absent.. Toes downgoing.    DIAGNOSTIC DATA (LABS, IMAGING, TESTING) - I reviewed patient records, labs, notes, testing and imaging myself where available.  Lab Results  Component Value Date   WBC 6.7 07/25/2015   HGB 10.9 (L) 05/06/2016   HCT 32.0 (L) 05/06/2016   MCV 93.8 07/25/2015   PLT 195 07/25/2015      Component Value Date/Time   NA 139 05/06/2016 1138   K 4.9 05/06/2016 1138   CL 101 05/06/2016 1138   CO2 28 07/26/2015 1115   GLUCOSE 141 (H) 05/06/2016 1138   BUN 34 (H) 05/06/2016 1138   CREATININE 1.60 (H) 05/06/2016 1138   CALCIUM 10.1 07/26/2015 1115   PROT 6.3 (L) 07/25/2015 1606   ALBUMIN 3.6 07/25/2015 1606   AST 25 07/25/2015 1606   ALT 18 07/25/2015 1606   ALKPHOS 78 07/25/2015 1606   BILITOT 0.5 07/25/2015 1606   GFRNONAA 45 (L) 07/26/2015 1115   GFRAA 52 (L) 07/26/2015 1115   Lab Results  Component Value Date   CHOL 103 07/26/2015   HDL 36 (L) 07/26/2015   LDLCALC 42 07/26/2015   TRIG 124 07/26/2015   CHOLHDL 2.9 07/26/2015   Lab Results  Component Value Date   HGBA1C 7.4 (H) 07/26/2015    ASSESSMENT AND PLAN 83 year male with left basal ganglia subcortical infarct secondary to small vessel disease in April 2016 with vascular risk factors of diabetes and hyperlipidemia. New right pontine lacunar infarct in December 2016 here for follow-up visit. He had recent episodes of slurring of  speech and being tremulous, events were brief questionable seizure activity .The patient is a current patient of Dr. Leonie Man who is out of the office today . This note is sent to the work in doctor.     PLAN: Continue exercising to continue to improve strength, and use rolling walker to prevent falls at all times Continue Plavix for secondary stroke prevention Continue Pravachol for cholesterol, keep  LDL less than 70 followed by PCP Blood pressure in the office today 160/84 continue blood pressure medications with goal of systolic less than 482, continue to keep a log blood pressures Will get EEg to evaluate for seizure disorder Follow-up in 6 months, next with Dr. Leonie Man I spent 25 min  in total face to face time with the patient and wife more than 50% of which was spent counseling and coordination of care, reviewing test results reviewing medications and discussing and reviewing the diagnosis of stroke and his new symptoms of slurring speech and being tremulous questionable seizure event. Keep a record of these episodes  Dennie Bible, [facility], [facility], APRN  [facility] [address] [place], [place] [address] [phone]

## 2016-10-03 NOTE — Patient Instructions (Signed)
Continue exercising to continue to improve strength, and use rolling walker to prevent falls Continue Plavix for secondary stroke prevention Continue Pravachol for cholesterol LDL less than 70 Hemoglobin A1c less than 6.5 Will get EEg to evaluate for seizure disorder Follow-up in 6 months, next with Dr. Leonie Man

## 2016-10-04 DIAGNOSIS — R350 Frequency of micturition: Secondary | ICD-10-CM | POA: Diagnosis not present

## 2016-10-04 DIAGNOSIS — N3942 Incontinence without sensory awareness: Secondary | ICD-10-CM | POA: Diagnosis not present

## 2016-10-04 DIAGNOSIS — R351 Nocturia: Secondary | ICD-10-CM | POA: Diagnosis not present

## 2016-10-04 DIAGNOSIS — N3944 Nocturnal enuresis: Secondary | ICD-10-CM | POA: Diagnosis not present

## 2016-10-04 NOTE — Progress Notes (Signed)
I reviewed note and agree with plan.   Penni Bombard, MD 09/04/5807, 9:83 PM Certified in Neurology, Neurophysiology and Neuroimaging  [facility] [address] [place], [place] [address] [phone]

## 2016-10-18 ENCOUNTER — Encounter (INDEPENDENT_AMBULATORY_CARE_PROVIDER_SITE_OTHER): Payer: Medicare Other | Admitting: Ophthalmology

## 2016-10-18 DIAGNOSIS — H353231 Exudative age-related macular degeneration, bilateral, with active choroidal neovascularization: Secondary | ICD-10-CM | POA: Diagnosis not present

## 2016-10-18 DIAGNOSIS — H35033 Hypertensive retinopathy, bilateral: Secondary | ICD-10-CM

## 2016-10-18 DIAGNOSIS — I10 Essential (primary) hypertension: Secondary | ICD-10-CM

## 2016-10-18 DIAGNOSIS — H43813 Vitreous degeneration, bilateral: Secondary | ICD-10-CM

## 2016-10-23 DIAGNOSIS — N3944 Nocturnal enuresis: Secondary | ICD-10-CM | POA: Diagnosis not present

## 2016-10-23 DIAGNOSIS — R350 Frequency of micturition: Secondary | ICD-10-CM | POA: Diagnosis not present

## 2016-10-25 ENCOUNTER — Ambulatory Visit (INDEPENDENT_AMBULATORY_CARE_PROVIDER_SITE_OTHER): Payer: Medicare Other | Admitting: Diagnostic Neuroimaging

## 2016-10-25 DIAGNOSIS — I6381 Other cerebral infarction due to occlusion or stenosis of small artery: Secondary | ICD-10-CM

## 2016-10-25 DIAGNOSIS — G459 Transient cerebral ischemic attack, unspecified: Secondary | ICD-10-CM

## 2016-10-25 DIAGNOSIS — R251 Tremor, unspecified: Secondary | ICD-10-CM | POA: Diagnosis not present

## 2016-10-25 DIAGNOSIS — R4781 Slurred speech: Secondary | ICD-10-CM

## 2016-11-08 NOTE — Procedures (Signed)
   [facility]  EEG (ELECTROENCEPHALOGRAM) REPORT   STUDY DATE: 10/25/16 PATIENT NAME: Kristopher Zimmerman DOB: [birth year] MRN: [id number]  ORDERING CLINICIAN: Dennie Bible, NP   TECHNOLOGIST: Oneita Jolly TECHNIQUE: Electroencephalogram was recorded utilizing standard 10-20 system of lead placement and reformatted into average and bipolar montages.  RECORDING TIME: 20 minutes ACTIVATION: photic stimulation  CLINICAL INFORMATION: 83 year old male with tremors and slurred speech episode.  FINDINGS: Background rhythms of 6-7 hertz and 20-30 microvolts. No focal, lateralizing, epileptiform activity or seizures are seen. Patient recorded in the awake and drowsy state. EKG channel shows regular rhythm of 65-75 beats per minute.   IMPRESSION:   Abnormal EEG in awake and drowsy demonstrating: 1. Mild slowing consistent with mild encephalopathy. This can be seen with toxic, metabolic or neurodegenerative conditions.  2. No epileptiform discharges seen.    INTERPRETING PHYSICIAN:  Penni Bombard, MD Certified in Neurology, Neurophysiology and Neuroimaging  [facility] [address] [place], [place] [address] [phone]

## 2016-11-10 ENCOUNTER — Telehealth: Payer: Self-pay | Admitting: *Deleted

## 2016-11-10 NOTE — Telephone Encounter (Signed)
Per Daun Peacock, NP, spoke with patient's wife, on DPR and informed her that his EEG showed mild slowing consistent with mild encephalopathy. Advised this can be seen with aging and is a nonspecific finding. Informed her that no seizure activity was seen. Advised her to keep a record of his episodes of slurring of speech that was discussed in last visit and bring to the next visit with Dr Leonie Man. Advised they cal for any sudden or extreme changes in his slurred speech episodes. She verbalized understanding, appreciation of call.

## 2016-11-15 ENCOUNTER — Encounter (INDEPENDENT_AMBULATORY_CARE_PROVIDER_SITE_OTHER): Payer: Medicare Other | Admitting: Ophthalmology

## 2016-11-15 DIAGNOSIS — H35033 Hypertensive retinopathy, bilateral: Secondary | ICD-10-CM | POA: Diagnosis not present

## 2016-11-15 DIAGNOSIS — H353231 Exudative age-related macular degeneration, bilateral, with active choroidal neovascularization: Secondary | ICD-10-CM

## 2016-11-15 DIAGNOSIS — H43813 Vitreous degeneration, bilateral: Secondary | ICD-10-CM | POA: Diagnosis not present

## 2016-11-15 DIAGNOSIS — I10 Essential (primary) hypertension: Secondary | ICD-10-CM

## 2016-11-29 DIAGNOSIS — N3942 Incontinence without sensory awareness: Secondary | ICD-10-CM | POA: Diagnosis not present

## 2016-11-29 DIAGNOSIS — N3944 Nocturnal enuresis: Secondary | ICD-10-CM | POA: Diagnosis not present

## 2016-11-29 DIAGNOSIS — R350 Frequency of micturition: Secondary | ICD-10-CM | POA: Diagnosis not present

## 2016-12-13 ENCOUNTER — Encounter (INDEPENDENT_AMBULATORY_CARE_PROVIDER_SITE_OTHER): Payer: Medicare Other | Admitting: Ophthalmology

## 2016-12-13 DIAGNOSIS — H353231 Exudative age-related macular degeneration, bilateral, with active choroidal neovascularization: Secondary | ICD-10-CM

## 2016-12-13 DIAGNOSIS — H35033 Hypertensive retinopathy, bilateral: Secondary | ICD-10-CM | POA: Diagnosis not present

## 2016-12-13 DIAGNOSIS — H43813 Vitreous degeneration, bilateral: Secondary | ICD-10-CM | POA: Diagnosis not present

## 2016-12-13 DIAGNOSIS — I10 Essential (primary) hypertension: Secondary | ICD-10-CM

## 2016-12-26 DIAGNOSIS — E1165 Type 2 diabetes mellitus with hyperglycemia: Secondary | ICD-10-CM | POA: Diagnosis not present

## 2016-12-26 DIAGNOSIS — E782 Mixed hyperlipidemia: Secondary | ICD-10-CM | POA: Diagnosis not present

## 2017-01-02 DIAGNOSIS — E782 Mixed hyperlipidemia: Secondary | ICD-10-CM | POA: Diagnosis not present

## 2017-01-02 DIAGNOSIS — I129 Hypertensive chronic kidney disease with stage 1 through stage 4 chronic kidney disease, or unspecified chronic kidney disease: Secondary | ICD-10-CM | POA: Diagnosis not present

## 2017-01-02 DIAGNOSIS — E1121 Type 2 diabetes mellitus with diabetic nephropathy: Secondary | ICD-10-CM | POA: Diagnosis not present

## 2017-01-02 DIAGNOSIS — E1165 Type 2 diabetes mellitus with hyperglycemia: Secondary | ICD-10-CM | POA: Diagnosis not present

## 2017-01-10 ENCOUNTER — Encounter (INDEPENDENT_AMBULATORY_CARE_PROVIDER_SITE_OTHER): Payer: Medicare Other | Admitting: Ophthalmology

## 2017-01-10 DIAGNOSIS — H35033 Hypertensive retinopathy, bilateral: Secondary | ICD-10-CM

## 2017-01-10 DIAGNOSIS — H43813 Vitreous degeneration, bilateral: Secondary | ICD-10-CM

## 2017-01-10 DIAGNOSIS — H353231 Exudative age-related macular degeneration, bilateral, with active choroidal neovascularization: Secondary | ICD-10-CM | POA: Diagnosis not present

## 2017-01-10 DIAGNOSIS — I10 Essential (primary) hypertension: Secondary | ICD-10-CM | POA: Diagnosis not present

## 2017-01-30 ENCOUNTER — Encounter: Payer: Self-pay | Admitting: *Deleted

## 2017-01-30 NOTE — Progress Notes (Signed)
HPI: Follow-up aortic stenosis. Echocardiogram November 2015 showed normal LV function, mild aortic stenosis with a mean gradient of 10 mmHg, mild mitral regurgitation and mild biatrial enlargement. Nuclear study 2015 showed an ejection fraction of 44%. Perfusion was normal. Had CVA 12/16. Echocardiogram December 2016 showed normal LV systolic function, grade 1 diastolic dysfunction, sclerotic aortic valve and mild left atrial enlargement. Since he was last seen patient has occasional pain in his left upper chest radiating to his shoulder and left upper extremity. It is not pleuritic or positional. It is not exertional. It lasts approximately 1 minute and resolves. No associated symptoms. Some dyspnea with activities. No orthopnea, PND or pedal edema.  Current Outpatient Prescriptions  Medication Sig Dispense Refill  . amLODipine (NORVASC) 2.5 MG tablet Take 2.5 mg by mouth daily.  0  . B Complex-C (B-COMPLEX WITH VITAMIN C) tablet Take 1 tablet by mouth daily.    . calcium carbonate (OS-CAL) 600 MG TABS Take 600 mg by mouth daily with breakfast.     . Carboxymethylcellulose Sodium (REFRESH LIQUIGEL OP) Apply 1 drop to eye 4 (four) times daily.    . clopidogrel (PLAVIX) 75 MG tablet Take 1 tablet (75 mg total) by mouth daily. 30 tablet 1  . cyclobenzaprine (FLEXERIL) 10 MG tablet Take 10 mg by mouth at bedtime.    . DULoxetine (CYMBALTA) 30 MG capsule Take 30 mg by mouth daily. Take with a 60 to make 90    . DULoxetine (CYMBALTA) 60 MG capsule Take 60 mg by mouth daily. Take with 30 to make 90    . glyBURIDE-metformin (GLUCOVANCE) 2.5-500 MG per tablet Take 1 tablet by mouth 2 (two) times daily with a meal. 2 tabs at breakfast and 1 at Supper    . hydrochlorothiazide (HYDRODIURIL) 25 MG tablet Take 25 mg by mouth daily.    Marland Kitchen HYDROcodone-acetaminophen (NORCO/VICODIN) 5-325 MG tablet as needed.  0  . irbesartan (AVAPRO) 150 MG tablet Take 150 mg by mouth 2 (two) times daily.     Marland Kitchen LORazepam  (ATIVAN) 2 MG tablet Take 2 mg by mouth at bedtime.    . Magnesium Citrate 100 MG TABS Take 1 tablet by mouth daily.    . meloxicam (MOBIC) 15 MG tablet Take 15 mg by mouth at bedtime.    . metoprolol tartrate (LOPRESSOR) 25 MG tablet Take 0.5 tablets (12.5 mg total) by mouth 2 (two) times daily. 60 tablet 1  . Multiple Vitamins-Minerals (PRESERVISION AREDS 2) CAPS Take by mouth.    . pramipexole (MIRAPEX) 0.5 MG tablet Take 0.5 mg by mouth at bedtime.     . pravastatin (PRAVACHOL) 40 MG tablet Take 40 mg by mouth daily.    Marland Kitchen Propylene Glycol (SYSTANE BALANCE) 0.6 % SOLN Apply 1 drop to eye 4 (four) times daily.    . solifenacin (VESICARE) 5 MG tablet Take 5 mg by mouth daily.    . tamsulosin (FLOMAX) 0.4 MG CAPS Take by mouth.    . vitamin C (ASCORBIC ACID) 500 MG tablet Take 500 mg by mouth daily.     No current facility-administered medications for this visit.      Past Medical History:  Diagnosis Date  . Diabetes mellitus without complication ([place])   . Hyperlipidemia   . Hypertension   . Macular degeneration of right eye   . Neuropathy   . Neuropathy, diabetic ([place])   . Renal insufficiency   . Stroke [facility]) 11-28-2014   Also 07-25-2015  .  TIA (transient ischemic attack) 05/06/2016    Past Surgical History:  Procedure Laterality Date  . APPENDECTOMY    . BACK SURGERY    . Broken leg    . CERVICAL DISC SURGERY    . TONSILLECTOMY      Social History   Social History  . Marital status: Married    Spouse name: Vicky  . Number of children: 3  . Years of education: N/A   Occupational History  . Not on file.   Social History Main Topics  . Smoking status: Former Smoker    Quit date: 12/22/1979  . Smokeless tobacco: Never Used  . Alcohol use 0.6 oz/week    1 Glasses of wine per week     Comment: wine  . Drug use: No  . Sexual activity: Not on file   Other Topics Concern  . Not on file   Social History Narrative   Lives at home with wife.  3 grown kids.   Retired.  Caffeine none.      Family History  Problem Relation Age of Onset  . Cirrhosis Father   . Heart Problems Mother        enlarge heart  . Diabetes Brother   . Lung cancer Sister   . Alzheimer's disease Sister   . Heart disease Sister   . Lung cancer Brother     ROS: no fevers or chills, productive cough, hemoptysis, dysphasia, odynophagia, melena, hematochezia, dysuria, hematuria, rash, seizure activity, orthopnea, PND, pedal edema, claudication. Remaining systems are negative.  Physical Exam: Well-developed elderly in no acute distress.  Skin is warm and dry.  HEENT is normal.  Neck is supple.  Chest is clear to auscultation with normal expansion.  Cardiovascular exam is regular rate and rhythm. 2/6 systolic murmur Abdominal exam nontender or distended. No masses palpated. Extremities show no edema. neuro grossly intact  ECG- sinus rhythm at a rate of 61. Anterior T-wave inversion. Cannot rule out prior inferior infarct. personally reviewed. Previous electrocardiogram 05/06/2016 showed sinus with left bundle branch block.  A/P  1 Aortic stenosis-most recent echocardiogram showed sclerotic aortic valve. I will not repeat at this point. No new symptoms. Aortic stenosis does not sound severe on examination.  2 hypertension-blood pressure is controlled. Continue present medications.  3 hyperlipidemia-continue statin.  4 chest pain-symptoms have both typical and atypical features. His electrocardiogram is significantly abnormal but previous tracing showed left bundle branch block. Difficult to interpret in that setting. I had a discussion with he and his wife today concerning aggressiveness of care. He wants only medical therapy with no procedures given his age. He is also a no CODE BLUE with no defibrillation, no CPR and no intubation. We will continue with Plavix and statin as well as metoprolol. I will increase amlodipine to 5 mg daily. We will follow and adjust regimen  as needed.  Kirk Ruths, MD

## 2017-02-07 ENCOUNTER — Encounter (INDEPENDENT_AMBULATORY_CARE_PROVIDER_SITE_OTHER): Payer: Medicare Other | Admitting: Ophthalmology

## 2017-02-07 DIAGNOSIS — H43813 Vitreous degeneration, bilateral: Secondary | ICD-10-CM

## 2017-02-07 DIAGNOSIS — H353231 Exudative age-related macular degeneration, bilateral, with active choroidal neovascularization: Secondary | ICD-10-CM | POA: Diagnosis not present

## 2017-02-07 DIAGNOSIS — H35033 Hypertensive retinopathy, bilateral: Secondary | ICD-10-CM

## 2017-02-07 DIAGNOSIS — I10 Essential (primary) hypertension: Secondary | ICD-10-CM

## 2017-02-08 DIAGNOSIS — R350 Frequency of micturition: Secondary | ICD-10-CM | POA: Diagnosis not present

## 2017-02-13 ENCOUNTER — Ambulatory Visit (INDEPENDENT_AMBULATORY_CARE_PROVIDER_SITE_OTHER): Payer: Medicare Other | Admitting: Cardiology

## 2017-02-13 ENCOUNTER — Encounter: Payer: Self-pay | Admitting: Cardiology

## 2017-02-13 VITALS — BP 140/60 | HR 61 | Ht 69.0 in | Wt 203.0 lb

## 2017-02-13 DIAGNOSIS — E7800 Pure hypercholesterolemia, unspecified: Secondary | ICD-10-CM | POA: Diagnosis not present

## 2017-02-13 DIAGNOSIS — I350 Nonrheumatic aortic (valve) stenosis: Secondary | ICD-10-CM | POA: Diagnosis not present

## 2017-02-13 DIAGNOSIS — R072 Precordial pain: Secondary | ICD-10-CM

## 2017-02-13 DIAGNOSIS — I10 Essential (primary) hypertension: Secondary | ICD-10-CM | POA: Diagnosis not present

## 2017-02-13 DIAGNOSIS — I639 Cerebral infarction, unspecified: Secondary | ICD-10-CM

## 2017-02-13 MED ORDER — AMLODIPINE BESYLATE 5 MG PO TABS: 5.0000 mg | ORAL_TABLET | Freq: Every day | ORAL | 3 refills | Status: AC

## 2017-02-13 NOTE — Patient Instructions (Signed)
Medication Instructions:   INCREASE AMLODIPINE TO 5 MG ONCE DAILY= 2 OF THE 2.5 MG TABLETS ONCE DAILY  Follow-Up:  Your physician wants you to follow-up in: [place] will receive a reminder letter in the mail two months in advance. If you don't receive a letter, please call our office to schedule the follow-up appointment.   If you need a refill on your cardiac medications before your next appointment, please call your pharmacy.

## 2017-02-15 DIAGNOSIS — R350 Frequency of micturition: Secondary | ICD-10-CM | POA: Diagnosis not present

## 2017-02-22 DIAGNOSIS — R350 Frequency of micturition: Secondary | ICD-10-CM | POA: Diagnosis not present

## 2017-03-01 DIAGNOSIS — R350 Frequency of micturition: Secondary | ICD-10-CM | POA: Diagnosis not present

## 2017-03-07 ENCOUNTER — Encounter (INDEPENDENT_AMBULATORY_CARE_PROVIDER_SITE_OTHER): Payer: Medicare Other | Admitting: Ophthalmology

## 2017-03-07 DIAGNOSIS — I10 Essential (primary) hypertension: Secondary | ICD-10-CM

## 2017-03-07 DIAGNOSIS — H43813 Vitreous degeneration, bilateral: Secondary | ICD-10-CM | POA: Diagnosis not present

## 2017-03-07 DIAGNOSIS — H353231 Exudative age-related macular degeneration, bilateral, with active choroidal neovascularization: Secondary | ICD-10-CM

## 2017-03-07 DIAGNOSIS — H35033 Hypertensive retinopathy, bilateral: Secondary | ICD-10-CM | POA: Diagnosis not present

## 2017-03-08 DIAGNOSIS — R350 Frequency of micturition: Secondary | ICD-10-CM | POA: Diagnosis not present

## 2017-03-15 DIAGNOSIS — R350 Frequency of micturition: Secondary | ICD-10-CM | POA: Diagnosis not present

## 2017-03-19 DIAGNOSIS — E119 Type 2 diabetes mellitus without complications: Secondary | ICD-10-CM | POA: Diagnosis not present

## 2017-03-19 DIAGNOSIS — H40013 Open angle with borderline findings, low risk, bilateral: Secondary | ICD-10-CM | POA: Diagnosis not present

## 2017-03-19 DIAGNOSIS — H353123 Nonexudative age-related macular degeneration, left eye, advanced atrophic without subfoveal involvement: Secondary | ICD-10-CM | POA: Diagnosis not present

## 2017-03-19 DIAGNOSIS — H353212 Exudative age-related macular degeneration, right eye, with inactive choroidal neovascularization: Secondary | ICD-10-CM | POA: Diagnosis not present

## 2017-03-22 DIAGNOSIS — R350 Frequency of micturition: Secondary | ICD-10-CM | POA: Diagnosis not present

## 2017-03-29 DIAGNOSIS — R350 Frequency of micturition: Secondary | ICD-10-CM | POA: Diagnosis not present

## 2017-04-03 DIAGNOSIS — H66002 Acute suppurative otitis media without spontaneous rupture of ear drum, left ear: Secondary | ICD-10-CM | POA: Diagnosis not present

## 2017-04-03 DIAGNOSIS — H6983 Other specified disorders of Eustachian tube, bilateral: Secondary | ICD-10-CM | POA: Diagnosis not present

## 2017-04-04 ENCOUNTER — Encounter (INDEPENDENT_AMBULATORY_CARE_PROVIDER_SITE_OTHER): Payer: Medicare Other | Admitting: Ophthalmology

## 2017-04-04 DIAGNOSIS — H353231 Exudative age-related macular degeneration, bilateral, with active choroidal neovascularization: Secondary | ICD-10-CM

## 2017-04-04 DIAGNOSIS — H43813 Vitreous degeneration, bilateral: Secondary | ICD-10-CM | POA: Diagnosis not present

## 2017-04-04 DIAGNOSIS — I10 Essential (primary) hypertension: Secondary | ICD-10-CM | POA: Diagnosis not present

## 2017-04-04 DIAGNOSIS — H35033 Hypertensive retinopathy, bilateral: Secondary | ICD-10-CM | POA: Diagnosis not present

## 2017-04-05 DIAGNOSIS — R350 Frequency of micturition: Secondary | ICD-10-CM | POA: Diagnosis not present

## 2017-04-10 ENCOUNTER — Encounter: Payer: Self-pay | Admitting: Neurology

## 2017-04-10 ENCOUNTER — Ambulatory Visit (INDEPENDENT_AMBULATORY_CARE_PROVIDER_SITE_OTHER): Payer: Medicare Other | Admitting: Neurology

## 2017-04-10 VITALS — BP 108/55 | HR 64 | Wt 196.2 lb

## 2017-04-10 DIAGNOSIS — I639 Cerebral infarction, unspecified: Secondary | ICD-10-CM

## 2017-04-10 DIAGNOSIS — I6990 Unspecified sequelae of unspecified cerebrovascular disease: Secondary | ICD-10-CM | POA: Diagnosis not present

## 2017-04-10 NOTE — Progress Notes (Signed)
[facility]  PATIENT: Kristopher Zimmerman DOB: 1923-10-21   REASON FOR VISIT: Follow-up for hemiparesis and history of recurrent stroke 12/ 2016, recent episode of slurring of speech along with shaking of extremity which were brief HISTORY FROM: Patient and wife    HISTORY OF PRESENT ILLNESS:83 y.o. male with a history of dm, htn who presented with slurred speech and increased falls since 11/27/14 He injured his toe fairly badly the day prior and was in the ER. He had taken his evening medications prior to coming in, so his wife did not think anything of the slurred speech and it was not mentioned to ER staff. CT scan of the head showed mild generalized atrophy and small vessel ischemic changes. MRI scan of the brain showed an acute sub-centimeter left basal ganglia/corona radiata infarct. MRA of the brain did not reveal large vessel stenosis. Carotid ultrasound showed no significant extracranial stenosis. Transthoracic echo showed normal ejection fraction of embolism. LDL cholesterol was normal at 43 mg percent. Hemoglobin A1c was slightly elevated at 7.0. Vascular risk factors identified included hypertension, diabetes and hyperlipidemia. Patient was seen by physical occupational speech therapy. He was started on aspirin 325 mg daily and advise aggressive risk factor management. Patient hast done well since discharge. His speech and has improved but he still has mild right facial droop. His right leg weakness is improved. His blood pressure is better controlled after additional metoprolol and is 116/65 today. He has no new complaints. He has mild lower extremity paresthesia Allen. 3. Diabetic neuropathy. He states his sugars are well controlled and hemoglobin A1c has not been repeated. Update 2/1/2017PS : He returns for follow-up after last visit He was admitted to [facility] in December 2016 with a right pontine lacunar infarct. He had presented with facial droop and  left-sided weakness. I have personally reviewed imaging studies and hospital stroke related testing. MRA of the brain and neck but did not show significant stenosis. Carotid ultrasound was unremarkable. LDL cholesterol was 42 mg percent and hemoglobin A1c was 7.4. Patient was started on Plavix and states is tolerating it well. He is able to walk with a walker. He states he is learned to be careful and get up slowly. His balance is still not back to normal but his walking more confidently with a walker. He does get tired. He has to rest after walking short distances. He states his blood pressure control is good and it is 138/68 today in office. His tolerating Plavix well without bleeding or bruising. He is currently getting a home physical and occupational therapy. He is had no major falls. UPDATE 05/03/2017CM Kristopher Zimmerman returns for follow-up with his wife for history of stroke in April 2016 in December 2016. He is currently taking Plavix with minimal bruising. He has not had further stroke or TIA episodes. He was getting some physical therapy however that concluded on April 17 and he continues to do his home exercise program. He is currently walking with a walker. He has had one fall and one  near fall since  last seen. Reviewed blood pressure logs over the last 2 weeks and his blood pressure control is good. He is independent with feeding dressing and bathing with minimal assistance. He is sleeping well at night. Appetite is good. He returns for reevaluation  UPDATE 9/5/ 2017CM Kristopher Zimmerman, 83 year old male returns for follow-up. He has history of stroke event in April and December 2016. He is currently on Plavix with minimal bruising and  he has not had further stroke or TIA symptoms symptoms. Blood pressure is well controlled wife keeps a log. He continues to do his home exercise program he ambulates with a rolling walker. He has had a couple of falls without injury since last seen. He is independent with feeding  dressing and bathing was minimal assistance appetite is good. He is sleeping well at night he returns for reevaluation UPDATE3/6/18.CM  Kristopher Zimmerman, 83 year old male returns for follow-up. He has a history of stroke event in April and September 2016. He is currently on Plavix for secondary stroke prevention with no bruising or bleeding. Recently his wife reports that he has had some episodes of slurring of speech in association tremor or shaking of his arm. This is brief. She is not sure if it was TIA or not it could be seizure. Blood pressure office today 160/74 wife had been keeping a log but she says she has not been keeping a record recently. He  denies missing any doses of his antihypertensives. He is also on Pravachol for hyperlipidemia without complaints of myalgias he is due to see a urologist tomorrow for urinary frequency and incontinence. He also has a history of macular degeneration with recent injections He returns for reevaluation. Update 04/10/2017 : He returns for follow-up after last visit 6 months ago. He is accompanied by his wife. He continues to do well. His had no recurrent stroke or TIA symptoms. He does still get some occasional slurred speech but did live in his diet. He is not had any definite seizures. He did have EEG done on 10/25/16 J personally reviewed and shows mild generalized slowing but no epileptiform activity. He remains on Plavix which is tolerating well without significant bleeding but does bruise easily. His blood pressure is well controlled and today it is 108/55. His diabetes control is satisfactory and last A1c was 7 in June this year. He remains on Pravachol which is tolerating well without muscle aches and pains and last lipid profile checked in June was satisfactory. Patient does use a walker at all times his had a few minor falls but no major injuries. He has no new complaints today. REVIEW OF SYSTEMS: Full 14 system review of systems performed and notable only for  those listed, all others are neg:   Chills, fatigue, hearing loss, loss of vision, back pain, walking difficulty, incontinence of bladder, frequency of urination, cold intolerance, easy bruising and bleeding, memory loss, speech difficulty, depression and all other systems negative  ALLERGIES: No Known Allergies  HOME MEDICATIONS: Outpatient Medications Prior to Visit  Medication Sig Dispense Refill  . amLODipine (NORVASC) 5 MG tablet Take 1 tablet (5 mg total) by mouth daily. 90 tablet 3  . B Complex-C (B-COMPLEX WITH VITAMIN C) tablet Take 1 tablet by mouth daily.    . calcium carbonate (OS-CAL) 600 MG TABS Take 600 mg by mouth daily with breakfast.     . Carboxymethylcellulose Sodium (REFRESH LIQUIGEL OP) Apply 1 drop to eye 4 (four) times daily.    . clopidogrel (PLAVIX) 75 MG tablet Take 1 tablet (75 mg total) by mouth daily. 30 tablet 1  . cyclobenzaprine (FLEXERIL) 10 MG tablet Take 10 mg by mouth at bedtime.    . DULoxetine (CYMBALTA) 30 MG capsule Take 30 mg by mouth daily. Take with a 60 to make 90    . DULoxetine (CYMBALTA) 60 MG capsule Take 60 mg by mouth daily. Take with 30 to make 90    . glyBURIDE-metformin (  GLUCOVANCE) 2.5-500 MG per tablet Take 1 tablet by mouth 2 (two) times daily with a meal. 2 tabs at breakfast and 1 at Supper    . hydrochlorothiazide (HYDRODIURIL) 25 MG tablet Take 25 mg by mouth daily.    Marland Kitchen HYDROcodone-acetaminophen (NORCO/VICODIN) 5-325 MG tablet as needed.  0  . irbesartan (AVAPRO) 150 MG tablet Take 150 mg by mouth 2 (two) times daily.     Marland Kitchen LORazepam (ATIVAN) 2 MG tablet Take 2 mg by mouth at bedtime.    . Magnesium Citrate 100 MG TABS Take 1 tablet by mouth daily.    . meloxicam (MOBIC) 15 MG tablet Take 15 mg by mouth at bedtime.    . metoprolol tartrate (LOPRESSOR) 25 MG tablet Take 0.5 tablets (12.5 mg total) by mouth 2 (two) times daily. 60 tablet 1  . Multiple Vitamins-Minerals (PRESERVISION AREDS 2) CAPS Take by mouth.    . pramipexole  (MIRAPEX) 0.5 MG tablet Take 0.5 mg by mouth at bedtime.     . pravastatin (PRAVACHOL) 40 MG tablet Take 40 mg by mouth daily.    Marland Kitchen Propylene Glycol (SYSTANE BALANCE) 0.6 % SOLN Apply 1 drop to eye 4 (four) times daily.    . solifenacin (VESICARE) 5 MG tablet Take 5 mg by mouth daily.    . tamsulosin (FLOMAX) 0.4 MG CAPS Take by mouth.    . vitamin C (ASCORBIC ACID) 500 MG tablet Take 500 mg by mouth daily.     No facility-administered medications prior to visit.     PAST MEDICAL HISTORY: Past Medical History:  Diagnosis Date  . Diabetes mellitus without complication ([place])   . Hyperlipidemia   . Hypertension   . Macular degeneration of right eye   . Neuropathy   . Neuropathy, diabetic ([place])   . Renal insufficiency   . Stroke [facility]) 11-28-2014   Also 07-25-2015  . TIA (transient ischemic attack) 05/06/2016    PAST SURGICAL HISTORY: Past Surgical History:  Procedure Laterality Date  . APPENDECTOMY    . BACK SURGERY    . Broken leg    . CERVICAL DISC SURGERY    . TONSILLECTOMY      FAMILY HISTORY: Family History  Problem Relation Age of Onset  . Cirrhosis Father   . Heart Problems Mother        enlarge heart  . Diabetes Brother   . Lung cancer Sister   . Alzheimer's disease Sister   . Heart disease Sister   . Lung cancer Brother     SOCIAL HISTORY: Social History   Social History  . Marital status: Married    Spouse name: Vicky  . Number of children: 3  . Years of education: N/A   Occupational History  . Not on file.   Social History Main Topics  . Smoking status: Former Smoker    Quit date: 12/22/1979  . Smokeless tobacco: Never Used  . Alcohol use 0.6 oz/week    1 Glasses of wine per week     Comment: wine  . Drug use: No  . Sexual activity: Not on file   Other Topics Concern  . Not on file   Social History Narrative   Lives at home with wife.  3 grown kids.  Retired.  Caffeine none.       PHYSICAL EXAM  Vitals:   04/10/17 1207  BP: (!)  108/55  Pulse: 64  Weight: 196 lb 3.2 oz (89 kg)   Body mass index is 28.97 kg/m.  General: well developed, well nourished Obese elderly male, seated, in no evident distress Head: head normocephalic and atraumatic.  Neck: supple with no carotid  bruits Cardiovascular: regular rate and rhythm, no murmurs Musculoskeletal: no deformity  Neurological examination   Mentation:Awake and fully alert. Oriented to place and time. Recent and remote memory intact. Attention span, concentration and fund of knowledge appropriate. Mood and affect appropriate.  Cranial Nerves: Pupils equal, briskly reactive to light. Extraocular movements full without nystagmus. Visual fields full to confrontation. Hearing intact. Facial sensation intact. Mild right lower face weakness but, tongue, palate moves normally and symmetrically.  Motor: Normal bulk and tone. Normal strength in all tested extremity muscles, except mild bilateral ankle dorsiflexor  weakness. Diminished fine finger movements on the right.  Sensory.: intact to touch ,pinprick but diminished .position and vibratory sensation over toes bilaterally.  Coordination: Rapid alternating movements normal in all extremities. Finger-to-nose and heel-to-shin performed accurately bilaterally. Gait and Station: deferred as he did not bring walker and is in wheel chair Reflexes: 1+ and symmetric except ankle jerks are absent.. Toes downgoing.    DIAGNOSTIC DATA (LABS, IMAGING, TESTING) - I reviewed patient records, labs, notes, testing and imaging myself where available.  Lab Results  Component Value Date   WBC 6.7 07/25/2015   HGB 10.9 (L) 05/06/2016   HCT 32.0 (L) 05/06/2016   MCV 93.8 07/25/2015   PLT 195 07/25/2015      Component Value Date/Time   NA 139 05/06/2016 1138   K 4.9 05/06/2016 1138   CL 101 05/06/2016 1138   CO2 28 07/26/2015 1115   GLUCOSE 141 (H) 05/06/2016 1138   BUN 34 (H) 05/06/2016 1138   CREATININE 1.60 (H) 05/06/2016 1138     CALCIUM 10.1 07/26/2015 1115   PROT 6.3 (L) 07/25/2015 1606   ALBUMIN 3.6 07/25/2015 1606   AST 25 07/25/2015 1606   ALT 18 07/25/2015 1606   ALKPHOS 78 07/25/2015 1606   BILITOT 0.5 07/25/2015 1606   GFRNONAA 45 (L) 07/26/2015 1115   GFRAA 52 (L) 07/26/2015 1115   Lab Results  Component Value Date   CHOL 103 07/26/2015   HDL 36 (L) 07/26/2015   LDLCALC 42 07/26/2015   TRIG 124 07/26/2015   CHOLHDL 2.9 07/26/2015   Lab Results  Component Value Date   HGBA1C 7.4 (H) 07/26/2015    ASSESSMENT AND PLAN 83 year male with left basal ganglia subcortical infarct secondary to small vessel disease in April 2016 with vascular risk factors of diabetes and hyperlipidemia. New right pontine lacunar infarct in December 2016   PLAN: I had a long d/w patient and his wife about his remote strokes, risk for recurrent stroke/TIAs, personally independently reviewed imaging studies and stroke evaluation results and answered questions.Continue Plavix}  for secondary stroke prevention and maintain strict control of hypertension with blood pressure goal below 130/90, diabetes with hemoglobin A1c goal below 6.5% and lipids with LDL cholesterol goal below 70 mg/dL. I also advised the patient to  use a walker at all times and fall and safety precautions. Greater than 50% time during this 25 minute visit was spent on counseling and coordination of care about his remote strokes and answering questions Since his been more than 2 years since his last stroke or do not believe any further routine scheduled follow-up appointment is necessary with the patient may be referred back by his primary physician in the future if needed   Antony Contras, MD [facility] [address], [address]  [place], [place] [address] [phone]

## 2017-04-10 NOTE — Patient Instructions (Signed)
I had a long d/w patient and his wife about his remote strokes, risk for recurrent stroke/TIAs, personally independently reviewed imaging studies and stroke evaluation results and answered questions.Continue Plavix}  for secondary stroke prevention and maintain strict control of hypertension with blood pressure goal below 130/90, diabetes with hemoglobin A1c goal below 6.5% and lipids with LDL cholesterol goal below 70 mg/dL. I also advised the patient to  use a walker at all times and fall and safety precautions. Since his been more than 2 years since his last stroke or do not believe any further routine scheduled follow-up appointment is necessary with the patient may be referred back by his primary physician in the future if needed   Fall Prevention in the Home Falls can cause injuries and can affect people from all age groups. There are many simple things that you can do to make your home safe and to help prevent falls. What can I do on the outside of my home?  Regularly repair the edges of walkways and driveways and fix any cracks.  Remove high doorway thresholds.  Trim any shrubbery on the main path into your home.  Use bright outdoor lighting.  Clear walkways of debris and clutter, including tools and rocks.  Regularly check that handrails are securely fastened and in good repair. Both sides of any steps should have handrails.  Install guardrails along the edges of any raised decks or porches.  Have leaves, snow, and ice cleared regularly.  Use sand or salt on walkways during winter months.  In the garage, clean up any spills right away, including grease or oil spills. What can I do in the bathroom?  Use night lights.  Install grab bars by the toilet and in the tub and shower. Do not use towel bars as grab bars.  Use non-skid mats or decals on the floor of the tub or shower.  If you need to sit down while you are in the shower, use a plastic, non-slip stool.  Keep the floor  dry. Immediately clean up any water that spills on the floor.  Remove soap buildup in the tub or shower on a regular basis.  Attach bath mats securely with double-sided non-slip rug tape.  Remove throw rugs and other tripping hazards from the floor. What can I do in the bedroom?  Use night lights.  Make sure that a bedside light is easy to reach.  Do not use oversized bedding that drapes onto the floor.  Have a firm chair that has side arms to use for getting dressed.  Remove throw rugs and other tripping hazards from the floor. What can I do in the kitchen?  Clean up any spills right away.  Avoid walking on wet floors.  Place frequently used items in easy-to-reach places.  If you need to reach for something above you, use a sturdy step stool that has a grab bar.  Keep electrical cables out of the way.  Do not use floor polish or wax that makes floors slippery. If you have to use wax, make sure that it is non-skid floor wax.  Remove throw rugs and other tripping hazards from the floor. What can I do in the stairways?  Do not leave any items on the stairs.  Make sure that there are handrails on both sides of the stairs. Fix handrails that are broken or loose. Make sure that handrails are as long as the stairways.  Check any carpeting to make sure that it is firmly  attached to the stairs. Fix any carpet that is loose or worn.  Avoid having throw rugs at the top or bottom of stairways, or secure the rugs with carpet tape to prevent them from moving.  Make sure that you have a light switch at the top of the stairs and the bottom of the stairs. If you do not have them, have them installed. What are some other fall prevention tips?  Wear closed-toe shoes that fit well and support your feet. Wear shoes that have rubber soles or low heels.  When you use a stepladder, make sure that it is completely opened and that the sides are firmly locked. Have someone hold the ladder while  you are using it. Do not climb a closed stepladder.  Add color or contrast paint or tape to grab bars and handrails in your home. Place contrasting color strips on the first and last steps.  Use mobility aids as needed, such as canes, walkers, scooters, and crutches.  Turn on lights if it is dark. Replace any light bulbs that burn out.  Set up furniture so that there are clear paths. Keep the furniture in the same spot.  Fix any uneven floor surfaces.  Choose a carpet design that does not hide the edge of steps of a stairway.  Be aware of any and all pets.  Review your medicines with your healthcare provider. Some medicines can cause dizziness or changes in blood pressure, which increase your risk of falling. Talk with your health care provider about other ways that you can decrease your risk of falls. This may include working with a physical therapist or trainer to improve your strength, balance, and endurance. This information is not intended to replace advice given to you by your health care provider. Make sure you discuss any questions you have with your health care provider. Document Released: 07/07/2002 Document Revised: 12/14/2015 Document Reviewed: 08/21/2014 Elsevier Interactive Patient Education  2017 [employer].

## 2017-04-12 DIAGNOSIS — R350 Frequency of micturition: Secondary | ICD-10-CM | POA: Diagnosis not present

## 2017-04-19 DIAGNOSIS — R350 Frequency of micturition: Secondary | ICD-10-CM | POA: Diagnosis not present

## 2017-05-03 DIAGNOSIS — R350 Frequency of micturition: Secondary | ICD-10-CM | POA: Diagnosis not present

## 2017-05-09 ENCOUNTER — Encounter (INDEPENDENT_AMBULATORY_CARE_PROVIDER_SITE_OTHER): Payer: Medicare Other | Admitting: Ophthalmology

## 2017-05-09 DIAGNOSIS — H353231 Exudative age-related macular degeneration, bilateral, with active choroidal neovascularization: Secondary | ICD-10-CM

## 2017-05-09 DIAGNOSIS — I10 Essential (primary) hypertension: Secondary | ICD-10-CM | POA: Diagnosis not present

## 2017-05-09 DIAGNOSIS — H35033 Hypertensive retinopathy, bilateral: Secondary | ICD-10-CM | POA: Diagnosis not present

## 2017-05-09 DIAGNOSIS — H43813 Vitreous degeneration, bilateral: Secondary | ICD-10-CM

## 2017-05-24 DIAGNOSIS — R350 Frequency of micturition: Secondary | ICD-10-CM | POA: Diagnosis not present

## 2017-05-24 DIAGNOSIS — I129 Hypertensive chronic kidney disease with stage 1 through stage 4 chronic kidney disease, or unspecified chronic kidney disease: Secondary | ICD-10-CM | POA: Diagnosis not present

## 2017-05-24 DIAGNOSIS — E1165 Type 2 diabetes mellitus with hyperglycemia: Secondary | ICD-10-CM | POA: Diagnosis not present

## 2017-05-24 DIAGNOSIS — E782 Mixed hyperlipidemia: Secondary | ICD-10-CM | POA: Diagnosis not present

## 2017-05-29 DIAGNOSIS — E1165 Type 2 diabetes mellitus with hyperglycemia: Secondary | ICD-10-CM | POA: Diagnosis not present

## 2017-05-29 DIAGNOSIS — E1121 Type 2 diabetes mellitus with diabetic nephropathy: Secondary | ICD-10-CM | POA: Diagnosis not present

## 2017-05-29 DIAGNOSIS — R799 Abnormal finding of blood chemistry, unspecified: Secondary | ICD-10-CM | POA: Diagnosis not present

## 2017-05-29 DIAGNOSIS — N179 Acute kidney failure, unspecified: Secondary | ICD-10-CM | POA: Diagnosis not present

## 2017-05-29 DIAGNOSIS — E1142 Type 2 diabetes mellitus with diabetic polyneuropathy: Secondary | ICD-10-CM | POA: Diagnosis not present

## 2017-06-12 DIAGNOSIS — R799 Abnormal finding of blood chemistry, unspecified: Secondary | ICD-10-CM | POA: Diagnosis not present

## 2017-06-12 DIAGNOSIS — N179 Acute kidney failure, unspecified: Secondary | ICD-10-CM | POA: Diagnosis not present

## 2017-06-13 ENCOUNTER — Encounter (INDEPENDENT_AMBULATORY_CARE_PROVIDER_SITE_OTHER): Payer: Medicare Other | Admitting: Ophthalmology

## 2017-06-14 DIAGNOSIS — R350 Frequency of micturition: Secondary | ICD-10-CM | POA: Diagnosis not present

## 2017-06-18 ENCOUNTER — Encounter (INDEPENDENT_AMBULATORY_CARE_PROVIDER_SITE_OTHER): Payer: Medicare Other | Admitting: Ophthalmology

## 2017-06-18 DIAGNOSIS — H43813 Vitreous degeneration, bilateral: Secondary | ICD-10-CM

## 2017-06-18 DIAGNOSIS — H35033 Hypertensive retinopathy, bilateral: Secondary | ICD-10-CM | POA: Diagnosis not present

## 2017-06-18 DIAGNOSIS — I10 Essential (primary) hypertension: Secondary | ICD-10-CM | POA: Diagnosis not present

## 2017-06-18 DIAGNOSIS — H353231 Exudative age-related macular degeneration, bilateral, with active choroidal neovascularization: Secondary | ICD-10-CM

## 2017-07-05 DIAGNOSIS — R350 Frequency of micturition: Secondary | ICD-10-CM | POA: Diagnosis not present

## 2017-07-17 ENCOUNTER — Encounter (INDEPENDENT_AMBULATORY_CARE_PROVIDER_SITE_OTHER): Payer: Medicare Other | Admitting: Ophthalmology

## 2017-07-17 DIAGNOSIS — H353231 Exudative age-related macular degeneration, bilateral, with active choroidal neovascularization: Secondary | ICD-10-CM | POA: Diagnosis not present

## 2017-07-17 DIAGNOSIS — H43813 Vitreous degeneration, bilateral: Secondary | ICD-10-CM

## 2017-07-17 DIAGNOSIS — I10 Essential (primary) hypertension: Secondary | ICD-10-CM | POA: Diagnosis not present

## 2017-07-17 DIAGNOSIS — H35033 Hypertensive retinopathy, bilateral: Secondary | ICD-10-CM

## 2017-07-19 DIAGNOSIS — N179 Acute kidney failure, unspecified: Secondary | ICD-10-CM | POA: Diagnosis not present

## 2017-07-19 DIAGNOSIS — E1165 Type 2 diabetes mellitus with hyperglycemia: Secondary | ICD-10-CM | POA: Diagnosis not present

## 2017-07-19 DIAGNOSIS — I129 Hypertensive chronic kidney disease with stage 1 through stage 4 chronic kidney disease, or unspecified chronic kidney disease: Secondary | ICD-10-CM | POA: Diagnosis not present

## 2017-07-19 DIAGNOSIS — E1121 Type 2 diabetes mellitus with diabetic nephropathy: Secondary | ICD-10-CM | POA: Diagnosis not present

## 2017-07-19 DIAGNOSIS — G603 Idiopathic progressive neuropathy: Secondary | ICD-10-CM | POA: Diagnosis not present

## 2017-07-19 DIAGNOSIS — E782 Mixed hyperlipidemia: Secondary | ICD-10-CM | POA: Diagnosis not present

## 2017-07-26 DIAGNOSIS — H66002 Acute suppurative otitis media without spontaneous rupture of ear drum, left ear: Secondary | ICD-10-CM | POA: Diagnosis not present

## 2017-07-26 DIAGNOSIS — H6983 Other specified disorders of Eustachian tube, bilateral: Secondary | ICD-10-CM | POA: Diagnosis not present

## 2017-08-02 DIAGNOSIS — R350 Frequency of micturition: Secondary | ICD-10-CM | POA: Diagnosis not present

## 2017-08-07 DIAGNOSIS — H6983 Other specified disorders of Eustachian tube, bilateral: Secondary | ICD-10-CM | POA: Diagnosis not present

## 2017-08-21 ENCOUNTER — Encounter (INDEPENDENT_AMBULATORY_CARE_PROVIDER_SITE_OTHER): Payer: Medicare Other | Admitting: Ophthalmology

## 2017-08-21 DIAGNOSIS — H43813 Vitreous degeneration, bilateral: Secondary | ICD-10-CM | POA: Diagnosis not present

## 2017-08-21 DIAGNOSIS — I10 Essential (primary) hypertension: Secondary | ICD-10-CM

## 2017-08-21 DIAGNOSIS — H35033 Hypertensive retinopathy, bilateral: Secondary | ICD-10-CM

## 2017-08-21 DIAGNOSIS — H353231 Exudative age-related macular degeneration, bilateral, with active choroidal neovascularization: Secondary | ICD-10-CM | POA: Diagnosis not present

## 2017-08-23 DIAGNOSIS — R350 Frequency of micturition: Secondary | ICD-10-CM | POA: Diagnosis not present

## 2017-09-13 DIAGNOSIS — R350 Frequency of micturition: Secondary | ICD-10-CM | POA: Diagnosis not present

## 2017-09-18 ENCOUNTER — Encounter (INDEPENDENT_AMBULATORY_CARE_PROVIDER_SITE_OTHER): Payer: Medicare Other | Admitting: Ophthalmology

## 2017-09-18 DIAGNOSIS — H35033 Hypertensive retinopathy, bilateral: Secondary | ICD-10-CM

## 2017-09-18 DIAGNOSIS — H353231 Exudative age-related macular degeneration, bilateral, with active choroidal neovascularization: Secondary | ICD-10-CM

## 2017-09-18 DIAGNOSIS — H43813 Vitreous degeneration, bilateral: Secondary | ICD-10-CM | POA: Diagnosis not present

## 2017-09-18 DIAGNOSIS — I10 Essential (primary) hypertension: Secondary | ICD-10-CM

## 2017-10-04 DIAGNOSIS — D649 Anemia, unspecified: Secondary | ICD-10-CM | POA: Diagnosis not present

## 2017-10-04 DIAGNOSIS — Z0000 Encounter for general adult medical examination without abnormal findings: Secondary | ICD-10-CM | POA: Diagnosis not present

## 2017-10-04 DIAGNOSIS — E1121 Type 2 diabetes mellitus with diabetic nephropathy: Secondary | ICD-10-CM | POA: Diagnosis not present

## 2017-10-04 DIAGNOSIS — I129 Hypertensive chronic kidney disease with stage 1 through stage 4 chronic kidney disease, or unspecified chronic kidney disease: Secondary | ICD-10-CM | POA: Diagnosis not present

## 2017-10-04 DIAGNOSIS — I10 Essential (primary) hypertension: Secondary | ICD-10-CM | POA: Diagnosis not present

## 2017-10-04 DIAGNOSIS — E1165 Type 2 diabetes mellitus with hyperglycemia: Secondary | ICD-10-CM | POA: Diagnosis not present

## 2017-10-04 DIAGNOSIS — E782 Mixed hyperlipidemia: Secondary | ICD-10-CM | POA: Diagnosis not present

## 2017-10-04 DIAGNOSIS — R350 Frequency of micturition: Secondary | ICD-10-CM | POA: Diagnosis not present

## 2017-10-16 ENCOUNTER — Encounter (INDEPENDENT_AMBULATORY_CARE_PROVIDER_SITE_OTHER): Payer: Medicare Other | Admitting: Ophthalmology

## 2017-10-16 DIAGNOSIS — D508 Other iron deficiency anemias: Secondary | ICD-10-CM | POA: Diagnosis not present

## 2017-10-16 DIAGNOSIS — I63431 Cerebral infarction due to embolism of right posterior cerebral artery: Secondary | ICD-10-CM | POA: Diagnosis not present

## 2017-10-16 DIAGNOSIS — E782 Mixed hyperlipidemia: Secondary | ICD-10-CM | POA: Diagnosis not present

## 2017-10-16 DIAGNOSIS — I63132 Cerebral infarction due to embolism of left carotid artery: Secondary | ICD-10-CM | POA: Diagnosis not present

## 2017-10-16 DIAGNOSIS — E1121 Type 2 diabetes mellitus with diabetic nephropathy: Secondary | ICD-10-CM | POA: Diagnosis not present

## 2017-10-16 DIAGNOSIS — E1142 Type 2 diabetes mellitus with diabetic polyneuropathy: Secondary | ICD-10-CM | POA: Diagnosis not present

## 2017-10-16 DIAGNOSIS — E10319 Type 1 diabetes mellitus with unspecified diabetic retinopathy without macular edema: Secondary | ICD-10-CM | POA: Diagnosis not present

## 2017-10-16 DIAGNOSIS — G603 Idiopathic progressive neuropathy: Secondary | ICD-10-CM | POA: Diagnosis not present

## 2017-10-16 DIAGNOSIS — E1165 Type 2 diabetes mellitus with hyperglycemia: Secondary | ICD-10-CM | POA: Diagnosis not present

## 2017-10-16 DIAGNOSIS — I129 Hypertensive chronic kidney disease with stage 1 through stage 4 chronic kidney disease, or unspecified chronic kidney disease: Secondary | ICD-10-CM | POA: Diagnosis not present

## 2017-10-17 ENCOUNTER — Encounter (INDEPENDENT_AMBULATORY_CARE_PROVIDER_SITE_OTHER): Payer: Medicare Other | Admitting: Ophthalmology

## 2017-10-17 DIAGNOSIS — H43813 Vitreous degeneration, bilateral: Secondary | ICD-10-CM

## 2017-10-17 DIAGNOSIS — I10 Essential (primary) hypertension: Secondary | ICD-10-CM | POA: Diagnosis not present

## 2017-10-17 DIAGNOSIS — H35033 Hypertensive retinopathy, bilateral: Secondary | ICD-10-CM | POA: Diagnosis not present

## 2017-10-17 DIAGNOSIS — H353231 Exudative age-related macular degeneration, bilateral, with active choroidal neovascularization: Secondary | ICD-10-CM

## 2017-10-25 DIAGNOSIS — R350 Frequency of micturition: Secondary | ICD-10-CM | POA: Diagnosis not present

## 2017-11-01 DIAGNOSIS — I63431 Cerebral infarction due to embolism of right posterior cerebral artery: Secondary | ICD-10-CM | POA: Diagnosis not present

## 2017-11-01 DIAGNOSIS — E1121 Type 2 diabetes mellitus with diabetic nephropathy: Secondary | ICD-10-CM | POA: Diagnosis not present

## 2017-11-01 DIAGNOSIS — E1165 Type 2 diabetes mellitus with hyperglycemia: Secondary | ICD-10-CM | POA: Diagnosis not present

## 2017-11-15 ENCOUNTER — Encounter (INDEPENDENT_AMBULATORY_CARE_PROVIDER_SITE_OTHER): Payer: Medicare Other | Admitting: Ophthalmology

## 2017-11-16 ENCOUNTER — Encounter (INDEPENDENT_AMBULATORY_CARE_PROVIDER_SITE_OTHER): Payer: Medicare Other | Admitting: Ophthalmology

## 2017-11-16 DIAGNOSIS — H353231 Exudative age-related macular degeneration, bilateral, with active choroidal neovascularization: Secondary | ICD-10-CM

## 2017-11-16 DIAGNOSIS — H43813 Vitreous degeneration, bilateral: Secondary | ICD-10-CM | POA: Diagnosis not present

## 2017-11-16 DIAGNOSIS — H35033 Hypertensive retinopathy, bilateral: Secondary | ICD-10-CM | POA: Diagnosis not present

## 2017-11-16 DIAGNOSIS — I10 Essential (primary) hypertension: Secondary | ICD-10-CM | POA: Diagnosis not present

## 2017-11-22 DIAGNOSIS — R350 Frequency of micturition: Secondary | ICD-10-CM | POA: Diagnosis not present

## 2017-12-13 DIAGNOSIS — R350 Frequency of micturition: Secondary | ICD-10-CM | POA: Diagnosis not present

## 2017-12-14 ENCOUNTER — Encounter (INDEPENDENT_AMBULATORY_CARE_PROVIDER_SITE_OTHER): Payer: Medicare Other | Admitting: Ophthalmology

## 2017-12-20 ENCOUNTER — Encounter (INDEPENDENT_AMBULATORY_CARE_PROVIDER_SITE_OTHER): Payer: Medicare Other | Admitting: Ophthalmology

## 2017-12-20 DIAGNOSIS — H35033 Hypertensive retinopathy, bilateral: Secondary | ICD-10-CM | POA: Diagnosis not present

## 2017-12-20 DIAGNOSIS — H353231 Exudative age-related macular degeneration, bilateral, with active choroidal neovascularization: Secondary | ICD-10-CM

## 2017-12-20 DIAGNOSIS — H43813 Vitreous degeneration, bilateral: Secondary | ICD-10-CM

## 2017-12-20 DIAGNOSIS — I10 Essential (primary) hypertension: Secondary | ICD-10-CM

## 2018-01-02 DIAGNOSIS — H6983 Other specified disorders of Eustachian tube, bilateral: Secondary | ICD-10-CM | POA: Diagnosis not present

## 2018-01-02 DIAGNOSIS — H66002 Acute suppurative otitis media without spontaneous rupture of ear drum, left ear: Secondary | ICD-10-CM | POA: Diagnosis not present

## 2018-01-03 DIAGNOSIS — R350 Frequency of micturition: Secondary | ICD-10-CM | POA: Diagnosis not present

## 2018-01-09 DIAGNOSIS — I129 Hypertensive chronic kidney disease with stage 1 through stage 4 chronic kidney disease, or unspecified chronic kidney disease: Secondary | ICD-10-CM | POA: Diagnosis not present

## 2018-01-09 DIAGNOSIS — E782 Mixed hyperlipidemia: Secondary | ICD-10-CM | POA: Diagnosis not present

## 2018-01-09 DIAGNOSIS — D508 Other iron deficiency anemias: Secondary | ICD-10-CM | POA: Diagnosis not present

## 2018-01-14 DIAGNOSIS — D649 Anemia, unspecified: Secondary | ICD-10-CM | POA: Diagnosis not present

## 2018-01-14 DIAGNOSIS — E1165 Type 2 diabetes mellitus with hyperglycemia: Secondary | ICD-10-CM | POA: Diagnosis not present

## 2018-01-14 DIAGNOSIS — E1121 Type 2 diabetes mellitus with diabetic nephropathy: Secondary | ICD-10-CM | POA: Diagnosis not present

## 2018-01-14 DIAGNOSIS — D508 Other iron deficiency anemias: Secondary | ICD-10-CM | POA: Diagnosis not present

## 2018-01-14 DIAGNOSIS — E782 Mixed hyperlipidemia: Secondary | ICD-10-CM | POA: Diagnosis not present

## 2018-01-17 ENCOUNTER — Encounter (INDEPENDENT_AMBULATORY_CARE_PROVIDER_SITE_OTHER): Payer: Medicare Other | Admitting: Ophthalmology

## 2018-01-17 DIAGNOSIS — I10 Essential (primary) hypertension: Secondary | ICD-10-CM | POA: Diagnosis not present

## 2018-01-17 DIAGNOSIS — H35033 Hypertensive retinopathy, bilateral: Secondary | ICD-10-CM

## 2018-01-17 DIAGNOSIS — H353231 Exudative age-related macular degeneration, bilateral, with active choroidal neovascularization: Secondary | ICD-10-CM | POA: Diagnosis not present

## 2018-01-17 DIAGNOSIS — H43813 Vitreous degeneration, bilateral: Secondary | ICD-10-CM | POA: Diagnosis not present

## 2018-01-21 DIAGNOSIS — H903 Sensorineural hearing loss, bilateral: Secondary | ICD-10-CM | POA: Diagnosis not present

## 2018-01-21 DIAGNOSIS — H6983 Other specified disorders of Eustachian tube, bilateral: Secondary | ICD-10-CM | POA: Diagnosis not present

## 2018-02-07 DIAGNOSIS — R350 Frequency of micturition: Secondary | ICD-10-CM | POA: Diagnosis not present

## 2018-02-11 DIAGNOSIS — E782 Mixed hyperlipidemia: Secondary | ICD-10-CM | POA: Diagnosis not present

## 2018-02-11 DIAGNOSIS — E1165 Type 2 diabetes mellitus with hyperglycemia: Secondary | ICD-10-CM | POA: Diagnosis not present

## 2018-02-11 DIAGNOSIS — D508 Other iron deficiency anemias: Secondary | ICD-10-CM | POA: Diagnosis not present

## 2018-02-14 ENCOUNTER — Encounter (INDEPENDENT_AMBULATORY_CARE_PROVIDER_SITE_OTHER): Payer: Medicare Other | Admitting: Ophthalmology

## 2018-02-14 DIAGNOSIS — H43813 Vitreous degeneration, bilateral: Secondary | ICD-10-CM | POA: Diagnosis not present

## 2018-02-14 DIAGNOSIS — H353231 Exudative age-related macular degeneration, bilateral, with active choroidal neovascularization: Secondary | ICD-10-CM | POA: Diagnosis not present

## 2018-02-14 DIAGNOSIS — I10 Essential (primary) hypertension: Secondary | ICD-10-CM | POA: Diagnosis not present

## 2018-02-14 DIAGNOSIS — H35033 Hypertensive retinopathy, bilateral: Secondary | ICD-10-CM | POA: Diagnosis not present

## 2018-02-18 DIAGNOSIS — D508 Other iron deficiency anemias: Secondary | ICD-10-CM | POA: Diagnosis not present

## 2018-02-25 DIAGNOSIS — D508 Other iron deficiency anemias: Secondary | ICD-10-CM | POA: Diagnosis not present

## 2018-03-07 DIAGNOSIS — R350 Frequency of micturition: Secondary | ICD-10-CM | POA: Diagnosis not present

## 2018-03-07 NOTE — Progress Notes (Signed)
HPI: Follow-up aortic stenosis. Echocardiogram November 2015 showed normal LV function, mild aortic stenosis with a mean gradient of 10 mmHg, mild mitral regurgitation and mild biatrial enlargement. Nuclear study 2015 showed an ejection fraction of 44%. Perfusion was normal. Had CVA 12/16. Echocardiogram December 2016 showed normal LV systolic function, grade 1 diastolic dysfunction, sclerotic aortic valve and mild left atrial enlargement. Since he was last seen  patient has no dyspnea.  He has occasional pain in his left upper extremity and left lateral chest wall.  He denies syncope.  No pedal edema.  Current Outpatient Medications  Medication Sig Dispense Refill  . amLODipine (NORVASC) 5 MG tablet Take 1 tablet (5 mg total) by mouth daily. 90 tablet 3  . B Complex-C (B-COMPLEX WITH VITAMIN C) tablet Take 1 tablet by mouth daily.    . calcium carbonate (OS-CAL) 600 MG TABS Take 600 mg by mouth daily with breakfast.     . Carboxymethylcellulose Sodium (REFRESH LIQUIGEL OP) Apply 1 drop to eye 4 (four) times daily.    . clopidogrel (PLAVIX) 75 MG tablet Take 1 tablet (75 mg total) by mouth daily. 30 tablet 1  . cyclobenzaprine (FLEXERIL) 10 MG tablet Take 10 mg by mouth at bedtime.    . DULoxetine (CYMBALTA) 30 MG capsule Take 30 mg by mouth daily. Take with a 60 to make 90    . DULoxetine (CYMBALTA) 60 MG capsule Take 60 mg by mouth daily. Take with 30 to make 90    . glyBURIDE (DIABETA) 5 MG tablet Take 5 mg by mouth 2 (two) times daily with a meal.    . HYDROcodone-acetaminophen (NORCO/VICODIN) 5-325 MG tablet as needed.  0  . LORazepam (ATIVAN) 2 MG tablet Take 2 mg by mouth at bedtime.    . Magnesium Citrate 100 MG TABS Take 1 tablet by mouth daily.    . metoprolol tartrate (LOPRESSOR) 25 MG tablet Take 0.5 tablets (12.5 mg total) by mouth 2 (two) times daily. 60 tablet 1  . Multiple Vitamins-Minerals (PRESERVISION AREDS 2) CAPS Take by mouth.    . pramipexole (MIRAPEX) 0.5 MG tablet  Take 0.5 mg by mouth at bedtime.     . pravastatin (PRAVACHOL) 40 MG tablet Take 40 mg by mouth daily.    Marland Kitchen Propylene Glycol (SYSTANE BALANCE) 0.6 % SOLN Apply 1 drop to eye 4 (four) times daily.    . solifenacin (VESICARE) 5 MG tablet Take 5 mg by mouth daily.    . tamsulosin (FLOMAX) 0.4 MG CAPS Take by mouth.    . vitamin C (ASCORBIC ACID) 500 MG tablet Take 500 mg by mouth daily.     No current facility-administered medications for this visit.      Past Medical History:  Diagnosis Date  . Diabetes mellitus without complication ([place])   . Hyperlipidemia   . Hypertension   . Macular degeneration of right eye   . Neuropathy   . Neuropathy, diabetic ([place])   . Renal insufficiency   . Stroke [facility]) 11-28-2014   Also 07-25-2015  . TIA (transient ischemic attack) 05/06/2016    Past Surgical History:  Procedure Laterality Date  . APPENDECTOMY    . BACK SURGERY    . Broken leg    . CERVICAL DISC SURGERY    . TONSILLECTOMY      Social History   Socioeconomic History  . Marital status: Married    Spouse name: Vicky  . Number of children: 3  . Years of  education: Not on file  . Highest education level: Not on file  Occupational History  . Not on file  Social Needs  . Financial resource strain: Not on file  . Food insecurity:    Worry: Not on file    Inability: Not on file  . Transportation needs:    Medical: Not on file    Non-medical: Not on file  Tobacco Use  . Smoking status: Former Smoker    Last attempt to quit: 12/22/1979    Years since quitting: 38.2  . Smokeless tobacco: Never Used  Substance and Sexual Activity  . Alcohol use: Yes    Alcohol/week: 1.0 standard drinks    Types: 1 Glasses of wine per week    Comment: wine  . Drug use: No  . Sexual activity: Not on file  Lifestyle  . Physical activity:    Days per week: Not on file    Minutes per session: Not on file  . Stress: Not on file  Relationships  . Social connections:    Talks on phone: Not on  file    Gets together: Not on file    Attends religious service: Not on file    Active member of club or organization: Not on file    Attends meetings of clubs or organizations: Not on file    Relationship status: Not on file  . Intimate partner violence:    Fear of current or ex partner: Not on file    Emotionally abused: Not on file    Physically abused: Not on file    Forced sexual activity: Not on file  Other Topics Concern  . Not on file  Social History Narrative   Lives at home with wife.  3 grown kids.  Retired.  Caffeine none.      Family History  Problem Relation Age of Onset  . Cirrhosis Father   . Heart Problems Mother        enlarge heart  . Diabetes Brother   . Lung cancer Sister   . Alzheimer's disease Sister   . Heart disease Sister   . Lung cancer Brother     ROS: Bilateral lower extremity pain felt secondary to neuropathy but no fevers or chills, productive cough, hemoptysis, dysphasia, odynophagia, melena, hematochezia, dysuria, hematuria, rash, seizure activity, orthopnea, PND, pedal edema, claudication. Remaining systems are negative.  Physical Exam: Well-developed well-nourished in no acute distress.  Skin is warm and dry.  HEENT is normal.  Neck is supple.  Chest is clear to auscultation with normal expansion.  Cardiovascular exam is regular rate and rhythm.  2/6 systolic murmur left sternal border. Abdominal exam nontender or distended. No masses palpated. Extremities show no edema. neuro grossly intact  ECG-sinus rhythm with first-degree AV block with left bundle branch block.  Personally reviewed  A/P  1 aortic stenosis-sclerotic on most recent echocardiogram.  Patient is not having symptoms.  Murmur sounds worse.  However aortic stenosis does not sound severe.  Patient states he would not be agreeable to procedures in the future.  I therefore would not plan on repeating echocardiogram.  2 hypertension-blood pressure is controlled.  Continue  present medications.  3 hyperlipidemia-continue statin.  4 chest pain-patient has atypical symptoms.  He is a no CODE BLUE and does not want aggressive evaluation.  No plans for further evaluation.  Kirk Ruths, MD

## 2018-03-11 ENCOUNTER — Ambulatory Visit (INDEPENDENT_AMBULATORY_CARE_PROVIDER_SITE_OTHER): Payer: Medicare Other | Admitting: Cardiology

## 2018-03-11 ENCOUNTER — Encounter: Payer: Self-pay | Admitting: Cardiology

## 2018-03-11 VITALS — BP 118/56 | HR 85 | Ht 69.5 in | Wt 196.0 lb

## 2018-03-11 DIAGNOSIS — E7800 Pure hypercholesterolemia, unspecified: Secondary | ICD-10-CM | POA: Diagnosis not present

## 2018-03-11 DIAGNOSIS — I10 Essential (primary) hypertension: Secondary | ICD-10-CM

## 2018-03-11 DIAGNOSIS — I350 Nonrheumatic aortic (valve) stenosis: Secondary | ICD-10-CM | POA: Diagnosis not present

## 2018-03-11 DIAGNOSIS — R072 Precordial pain: Secondary | ICD-10-CM

## 2018-03-11 NOTE — Patient Instructions (Signed)
Your physician wants you to follow-up in: ONE YEAR WITH DR CRENSHAW You will receive a reminder letter in the mail two months in advance. If you don't receive a letter, please call our office to schedule the follow-up appointment.   If you need a refill on your cardiac medications before your next appointment, please call your pharmacy.  

## 2018-03-14 ENCOUNTER — Encounter (INDEPENDENT_AMBULATORY_CARE_PROVIDER_SITE_OTHER): Payer: Medicare Other | Admitting: Ophthalmology

## 2018-03-14 DIAGNOSIS — H43813 Vitreous degeneration, bilateral: Secondary | ICD-10-CM

## 2018-03-14 DIAGNOSIS — I10 Essential (primary) hypertension: Secondary | ICD-10-CM | POA: Diagnosis not present

## 2018-03-14 DIAGNOSIS — H35033 Hypertensive retinopathy, bilateral: Secondary | ICD-10-CM

## 2018-03-14 DIAGNOSIS — H353231 Exudative age-related macular degeneration, bilateral, with active choroidal neovascularization: Secondary | ICD-10-CM | POA: Diagnosis not present

## 2018-03-21 DIAGNOSIS — H353212 Exudative age-related macular degeneration, right eye, with inactive choroidal neovascularization: Secondary | ICD-10-CM | POA: Diagnosis not present

## 2018-03-21 DIAGNOSIS — H353123 Nonexudative age-related macular degeneration, left eye, advanced atrophic without subfoveal involvement: Secondary | ICD-10-CM | POA: Diagnosis not present

## 2018-03-21 DIAGNOSIS — H40013 Open angle with borderline findings, low risk, bilateral: Secondary | ICD-10-CM | POA: Diagnosis not present

## 2018-03-21 DIAGNOSIS — E119 Type 2 diabetes mellitus without complications: Secondary | ICD-10-CM | POA: Diagnosis not present

## 2018-03-28 DIAGNOSIS — R350 Frequency of micturition: Secondary | ICD-10-CM | POA: Diagnosis not present

## 2018-04-02 DIAGNOSIS — D508 Other iron deficiency anemias: Secondary | ICD-10-CM | POA: Diagnosis not present

## 2018-04-02 DIAGNOSIS — E1165 Type 2 diabetes mellitus with hyperglycemia: Secondary | ICD-10-CM | POA: Diagnosis not present

## 2018-04-02 DIAGNOSIS — E782 Mixed hyperlipidemia: Secondary | ICD-10-CM | POA: Diagnosis not present

## 2018-04-08 DIAGNOSIS — D508 Other iron deficiency anemias: Secondary | ICD-10-CM | POA: Diagnosis not present

## 2018-04-08 DIAGNOSIS — E782 Mixed hyperlipidemia: Secondary | ICD-10-CM | POA: Diagnosis not present

## 2018-04-08 DIAGNOSIS — E1121 Type 2 diabetes mellitus with diabetic nephropathy: Secondary | ICD-10-CM | POA: Diagnosis not present

## 2018-04-08 DIAGNOSIS — E1165 Type 2 diabetes mellitus with hyperglycemia: Secondary | ICD-10-CM | POA: Diagnosis not present

## 2018-04-08 DIAGNOSIS — Z23 Encounter for immunization: Secondary | ICD-10-CM | POA: Diagnosis not present

## 2018-04-10 ENCOUNTER — Encounter (INDEPENDENT_AMBULATORY_CARE_PROVIDER_SITE_OTHER): Payer: Medicare Other | Admitting: Ophthalmology

## 2018-04-10 DIAGNOSIS — H353231 Exudative age-related macular degeneration, bilateral, with active choroidal neovascularization: Secondary | ICD-10-CM | POA: Diagnosis not present

## 2018-04-10 DIAGNOSIS — H35033 Hypertensive retinopathy, bilateral: Secondary | ICD-10-CM | POA: Diagnosis not present

## 2018-04-10 DIAGNOSIS — I10 Essential (primary) hypertension: Secondary | ICD-10-CM | POA: Diagnosis not present

## 2018-04-10 DIAGNOSIS — H43813 Vitreous degeneration, bilateral: Secondary | ICD-10-CM | POA: Diagnosis not present

## 2018-04-11 ENCOUNTER — Encounter (INDEPENDENT_AMBULATORY_CARE_PROVIDER_SITE_OTHER): Payer: Medicare Other | Admitting: Ophthalmology

## 2018-04-15 DIAGNOSIS — H66002 Acute suppurative otitis media without spontaneous rupture of ear drum, left ear: Secondary | ICD-10-CM | POA: Diagnosis not present

## 2018-04-18 DIAGNOSIS — R350 Frequency of micturition: Secondary | ICD-10-CM | POA: Diagnosis not present

## 2018-05-08 ENCOUNTER — Encounter (INDEPENDENT_AMBULATORY_CARE_PROVIDER_SITE_OTHER): Payer: Medicare Other | Admitting: Ophthalmology

## 2018-05-08 DIAGNOSIS — H35033 Hypertensive retinopathy, bilateral: Secondary | ICD-10-CM | POA: Diagnosis not present

## 2018-05-08 DIAGNOSIS — H43813 Vitreous degeneration, bilateral: Secondary | ICD-10-CM

## 2018-05-08 DIAGNOSIS — I10 Essential (primary) hypertension: Secondary | ICD-10-CM

## 2018-05-08 DIAGNOSIS — H353231 Exudative age-related macular degeneration, bilateral, with active choroidal neovascularization: Secondary | ICD-10-CM

## 2018-05-09 DIAGNOSIS — R350 Frequency of micturition: Secondary | ICD-10-CM | POA: Diagnosis not present

## 2018-05-30 DIAGNOSIS — R350 Frequency of micturition: Secondary | ICD-10-CM | POA: Diagnosis not present

## 2018-06-05 ENCOUNTER — Encounter (INDEPENDENT_AMBULATORY_CARE_PROVIDER_SITE_OTHER): Payer: Medicare Other | Admitting: Ophthalmology

## 2018-06-05 DIAGNOSIS — I10 Essential (primary) hypertension: Secondary | ICD-10-CM | POA: Diagnosis not present

## 2018-06-05 DIAGNOSIS — H43813 Vitreous degeneration, bilateral: Secondary | ICD-10-CM

## 2018-06-05 DIAGNOSIS — H353231 Exudative age-related macular degeneration, bilateral, with active choroidal neovascularization: Secondary | ICD-10-CM | POA: Diagnosis not present

## 2018-06-05 DIAGNOSIS — H35033 Hypertensive retinopathy, bilateral: Secondary | ICD-10-CM | POA: Diagnosis not present

## 2018-06-20 DIAGNOSIS — R350 Frequency of micturition: Secondary | ICD-10-CM | POA: Diagnosis not present

## 2018-07-03 ENCOUNTER — Encounter (INDEPENDENT_AMBULATORY_CARE_PROVIDER_SITE_OTHER): Payer: Medicare Other | Admitting: Ophthalmology

## 2018-07-03 DIAGNOSIS — H43813 Vitreous degeneration, bilateral: Secondary | ICD-10-CM | POA: Diagnosis not present

## 2018-07-03 DIAGNOSIS — H353231 Exudative age-related macular degeneration, bilateral, with active choroidal neovascularization: Secondary | ICD-10-CM

## 2018-07-03 DIAGNOSIS — H35033 Hypertensive retinopathy, bilateral: Secondary | ICD-10-CM

## 2018-07-03 DIAGNOSIS — I10 Essential (primary) hypertension: Secondary | ICD-10-CM

## 2018-07-11 DIAGNOSIS — R350 Frequency of micturition: Secondary | ICD-10-CM | POA: Diagnosis not present

## 2018-08-01 DIAGNOSIS — E782 Mixed hyperlipidemia: Secondary | ICD-10-CM | POA: Diagnosis not present

## 2018-08-01 DIAGNOSIS — R350 Frequency of micturition: Secondary | ICD-10-CM | POA: Diagnosis not present

## 2018-08-01 DIAGNOSIS — E1165 Type 2 diabetes mellitus with hyperglycemia: Secondary | ICD-10-CM | POA: Diagnosis not present

## 2018-08-01 DIAGNOSIS — I129 Hypertensive chronic kidney disease with stage 1 through stage 4 chronic kidney disease, or unspecified chronic kidney disease: Secondary | ICD-10-CM | POA: Diagnosis not present

## 2018-08-06 ENCOUNTER — Encounter (INDEPENDENT_AMBULATORY_CARE_PROVIDER_SITE_OTHER): Payer: Medicare Other | Admitting: Ophthalmology

## 2018-08-06 DIAGNOSIS — H43813 Vitreous degeneration, bilateral: Secondary | ICD-10-CM | POA: Diagnosis not present

## 2018-08-06 DIAGNOSIS — H35033 Hypertensive retinopathy, bilateral: Secondary | ICD-10-CM | POA: Diagnosis not present

## 2018-08-06 DIAGNOSIS — H353231 Exudative age-related macular degeneration, bilateral, with active choroidal neovascularization: Secondary | ICD-10-CM | POA: Diagnosis not present

## 2018-08-06 DIAGNOSIS — I10 Essential (primary) hypertension: Secondary | ICD-10-CM

## 2018-08-07 DIAGNOSIS — H6983 Other specified disorders of Eustachian tube, bilateral: Secondary | ICD-10-CM | POA: Diagnosis not present

## 2018-08-07 DIAGNOSIS — H66002 Acute suppurative otitis media without spontaneous rupture of ear drum, left ear: Secondary | ICD-10-CM | POA: Diagnosis not present

## 2018-08-09 ENCOUNTER — Encounter (HOSPITAL_BASED_OUTPATIENT_CLINIC_OR_DEPARTMENT_OTHER): Payer: Self-pay | Admitting: *Deleted

## 2018-08-09 ENCOUNTER — Other Ambulatory Visit: Payer: Self-pay

## 2018-08-09 ENCOUNTER — Emergency Department (HOSPITAL_BASED_OUTPATIENT_CLINIC_OR_DEPARTMENT_OTHER)
Admission: EM | Admit: 2018-08-09 | Discharge: 2018-08-09 | Disposition: A | Discharge status: Home / Self Care | Payer: Medicare Other | Attending: Emergency Medicine | Admitting: Emergency Medicine

## 2018-08-09 DIAGNOSIS — Y929 Unspecified place or not applicable: Secondary | ICD-10-CM | POA: Diagnosis not present

## 2018-08-09 DIAGNOSIS — E1140 Type 2 diabetes mellitus with diabetic neuropathy, unspecified: Secondary | ICD-10-CM | POA: Diagnosis not present

## 2018-08-09 DIAGNOSIS — X58XXXA Exposure to other specified factors, initial encounter: Secondary | ICD-10-CM | POA: Diagnosis not present

## 2018-08-09 DIAGNOSIS — Y998 Other external cause status: Secondary | ICD-10-CM | POA: Insufficient documentation

## 2018-08-09 DIAGNOSIS — Y9389 Activity, other specified: Secondary | ICD-10-CM | POA: Diagnosis not present

## 2018-08-09 DIAGNOSIS — S99921A Unspecified injury of right foot, initial encounter: Secondary | ICD-10-CM | POA: Diagnosis present

## 2018-08-09 DIAGNOSIS — Z79899 Other long term (current) drug therapy: Secondary | ICD-10-CM | POA: Diagnosis not present

## 2018-08-09 DIAGNOSIS — S91204A Unspecified open wound of right lesser toe(s) with damage to nail, initial encounter: Secondary | ICD-10-CM | POA: Diagnosis not present

## 2018-08-09 DIAGNOSIS — Z87891 Personal history of nicotine dependence: Secondary | ICD-10-CM | POA: Diagnosis not present

## 2018-08-09 DIAGNOSIS — Z7902 Long term (current) use of antithrombotics/antiplatelets: Secondary | ICD-10-CM | POA: Insufficient documentation

## 2018-08-09 DIAGNOSIS — Z7984 Long term (current) use of oral hypoglycemic drugs: Secondary | ICD-10-CM | POA: Insufficient documentation

## 2018-08-09 DIAGNOSIS — Z8673 Personal history of transient ischemic attack (TIA), and cerebral infarction without residual deficits: Secondary | ICD-10-CM | POA: Insufficient documentation

## 2018-08-09 DIAGNOSIS — S91209A Unspecified open wound of unspecified toe(s) with damage to nail, initial encounter: Secondary | ICD-10-CM

## 2018-08-09 NOTE — ED Provider Notes (Signed)
[facility] EMERGENCY DEPARTMENT Provider Note   CSN: [id number] Arrival date & time: 08/09/18  1430     History   Chief Complaint Chief Complaint  Patient presents with  . Toe Injury    HPI Kristopher Zimmerman is a 85 y.o. male.  HPI Patient presented to the emergency room for evaluation of a toenail injury.  Patient accidentally avulsed his toenail off from his right second toe.  Patient has neuropathy so he is not having any significant pain.  The nail completely avulsed off.  He has had some bleeding associated with that.  He denies any other injuries.  Patient has a history of diabetes and neuropathy and was concerned potentially about developing infection so he wanted to get it checked out. Past Medical History:  Diagnosis Date  . Diabetes mellitus without complication ([place])   . Hyperlipidemia   . Hypertension   . Macular degeneration of right eye   . Neuropathy   . Neuropathy, diabetic ([place])   . Renal insufficiency   . Stroke [facility]) 11-28-2014   Also 07-25-2015  . TIA (transient ischemic attack) 05/06/2016    Patient Active Problem List   Diagnosis Date Noted  . TIA (transient ischemic attack) 10/03/2016  . Slurred speech 10/03/2016  . Shaking 10/03/2016  . 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 HLD (hyperlipidemia)   . Acute CVA (cerebrovascular accident) ([place])   . Lacunar infarct, acute ([place]) 03/02/2015  . Diabetic neuropathy ([place]) 03/02/2015  . Stroke ([place]) 11/28/2014  . CVA (cerebral infarction) 11/28/2014  . Diabetes mellitus ([place]) 11/28/2014  . Chronic kidney disease (CKD) stage G2/A1, mildly decreased glomerular filtration rate (GFR) between 60-89 mL/min/1.73 square meter and albuminuria creatinine ratio less than 30 mg/g 11/28/2014  . Neuropathy in diabetes ([place]) 11/28/2014  . Toe fracture, left 11/28/2014  . DM (diabetes mellitus) type II controlled with renal manifestation ([place]) 11/28/2014  . Essential hypertension 05/27/2014  .  Hyperlipidemia 05/27/2014  . Aortic stenosis 05/27/2014  . Abnormal electrocardiogram 05/27/2014  . Chest pain 05/27/2014    Past Surgical History:  Procedure Laterality Date  . APPENDECTOMY    . BACK SURGERY    . Broken leg    . CERVICAL DISC SURGERY    . TONSILLECTOMY          Home Medications    Prior to Admission medications   Medication Sig Start Date End Date Taking? Authorizing Provider  Fe Fum-FePoly-Vit C-Vit B3 (INTEGRA) 62.5-62.5-40-3 MG CAPS Take by mouth.   Yes [provider]  linagliptin (TRADJENTA) 5 MG TABS tablet Take 5 mg by mouth daily.   Yes [provider]  promethazine-dextromethorphan (PROMETHAZINE-DM) 6.25-15 MG/5ML syrup Take by mouth 4 (four) times daily as needed for cough.   Yes [provider]  amLODipine (NORVASC) 5 MG tablet Take 1 tablet (5 mg total) by mouth daily. 02/13/17   Lelon Perla, MD  B Complex-C (B-COMPLEX WITH VITAMIN C) tablet Take 1 tablet by mouth daily.    [provider]  calcium carbonate (OS-CAL) 600 MG TABS Take 600 mg by mouth daily with breakfast.     [provider]  Carboxymethylcellulose Sodium (REFRESH LIQUIGEL OP) Apply 1 drop to eye 4 (four) times daily.    [provider]  clopidogrel (PLAVIX) 75 MG tablet Take 1 tablet (75 mg total) by mouth daily. 07/26/15   Elgergawy, Silver Huguenin, MD  cyclobenzaprine (FLEXERIL) 10 MG tablet Take 10 mg by mouth at bedtime.    [provider]  DULoxetine (CYMBALTA) 30 MG capsule Take 30 mg by mouth daily. Take with a 60 to make 90    [provider]  DULoxetine (CYMBALTA) 60 MG capsule Take 60 mg by mouth daily. Take with 30 to make 90    [provider]  glyBURIDE (DIABETA) 5 MG tablet Take 5 mg by mouth 2 (two) times daily with a meal.    [provider]  HYDROcodone-acetaminophen (NORCO/VICODIN) 5-325 MG tablet as needed. 09/14/16   [provider]  LORazepam (ATIVAN) 2 MG tablet Take 2  mg by mouth at bedtime.    [provider]  Magnesium Citrate 100 MG TABS Take 1 tablet by mouth daily.    [provider]  metoprolol tartrate (LOPRESSOR) 25 MG tablet Take 0.5 tablets (12.5 mg total) by mouth 2 (two) times daily. 11/30/14   Orson Eva, MD  Multiple Vitamins-Minerals (PRESERVISION AREDS 2) CAPS Take by mouth.    [provider]  pramipexole (MIRAPEX) 0.5 MG tablet Take 0.5 mg by mouth at bedtime.     [provider]  pravastatin (PRAVACHOL) 40 MG tablet Take 40 mg by mouth daily.    [provider]  Propylene Glycol (SYSTANE BALANCE) 0.6 % SOLN Apply 1 drop to eye 4 (four) times daily.    [provider]  solifenacin (VESICARE) 5 MG tablet Take 5 mg by mouth daily.    [provider]  tamsulosin (FLOMAX) 0.4 MG CAPS Take by mouth.    [provider]  vitamin C (ASCORBIC ACID) 500 MG tablet Take 500 mg by mouth daily.    [provider]    Family History Family History  Problem Relation Age of Onset  . Cirrhosis Father   . Heart Problems Mother        enlarge heart  . Diabetes Brother   . Lung cancer Sister   . Alzheimer's disease Sister   . Heart disease Sister   . Lung cancer Brother     Social History Social History   Tobacco Use  . Smoking status: Former Smoker    Last attempt to quit: 12/22/1979    Years since quitting: 38.6  . Smokeless tobacco: Never Used  Substance Use Topics  . Alcohol use: Yes    Alcohol/week: 1.0 standard drinks    Types: 1 Glasses of wine per week    Comment: wine  . Drug use: No     Allergies   Patient has no known allergies.   Review of Systems Review of Systems  All other systems reviewed and are negative.    Physical Exam Updated Vital Signs BP (!) 168/72 (BP Location: Left Arm)   Pulse 64   Temp 97.9 F (36.6 C) (Oral)   Resp 18   Ht 1.753 m (5\' 9" )   Wt 92.5 kg   SpO2 97%   BMI 30.13 kg/m   Physical Exam Vitals signs and  nursing note reviewed.  Constitutional:      General: He is not in acute distress.    Appearance: He is well-developed.  HENT:     Head: Normocephalic and atraumatic.     Right Ear: External ear normal.     Left Ear: External ear normal.  Eyes:     General: No scleral icterus.       Right eye: No discharge.        Left eye: No discharge.     Conjunctiva/sclera: Conjunctivae normal.  Neck:  Musculoskeletal: Neck supple.     Trachea: No tracheal deviation.  Cardiovascular:     Rate and Rhythm: Normal rate.  Pulmonary:     Effort: Pulmonary effort is normal. No respiratory distress.     Breath sounds: No stridor.  Abdominal:     General: There is no distension.  Musculoskeletal:        General: No swelling or deformity.     Comments: Right second toenail completely avulsed off, small amount of bleeding, extremity is warm and well-perfused, no cyanosis, no evidence of any laceration to the nailbed  Skin:    General: Skin is warm and dry.     Findings: No rash.  Neurological:     Mental Status: He is alert.     Cranial Nerves: Cranial nerve deficit: no gross deficits.      ED Treatments / Results   Procedures Procedures (including critical care time)  Medications Ordered in ED Medications - No data to display   Initial Impression / Assessment and Plan / ED Course  I have reviewed the triage vital signs and the nursing notes.  Pertinent labs & imaging results that were available during my care of the patient were reviewed by me and considered in my medical decision making (see chart for details).  Local wound care was provided by the patient since nurse.  The wound was irrigated antibiotic ointment was placed and a dressing was applied.  No signs of infections at this time.  No indication for empiric antibiotics.  Discussed changing the bandage daily and applying antibiotic ointment.  Final Clinical Impressions(s) / ED Diagnoses   Final diagnoses:  Nail avulsion of  toe, initial encounter    ED Discharge Orders    None       Dorie Rank, MD 08/09/18 1514

## 2018-08-09 NOTE — ED Triage Notes (Signed)
Pts toe nail came off and is bleeding. He is a diabetic and has neuropathy.

## 2018-08-09 NOTE — Discharge Instructions (Signed)
Apply antibiotic ointment to the wound daily, change the dressing daily

## 2018-08-19 DIAGNOSIS — D508 Other iron deficiency anemias: Secondary | ICD-10-CM | POA: Diagnosis not present

## 2018-08-19 DIAGNOSIS — E782 Mixed hyperlipidemia: Secondary | ICD-10-CM | POA: Diagnosis not present

## 2018-08-19 DIAGNOSIS — E1121 Type 2 diabetes mellitus with diabetic nephropathy: Secondary | ICD-10-CM | POA: Diagnosis not present

## 2018-08-19 DIAGNOSIS — E1165 Type 2 diabetes mellitus with hyperglycemia: Secondary | ICD-10-CM | POA: Diagnosis not present

## 2018-08-21 DIAGNOSIS — E1165 Type 2 diabetes mellitus with hyperglycemia: Secondary | ICD-10-CM | POA: Diagnosis not present

## 2018-08-22 DIAGNOSIS — R350 Frequency of micturition: Secondary | ICD-10-CM | POA: Diagnosis not present

## 2018-08-26 DIAGNOSIS — I63132 Cerebral infarction due to embolism of left carotid artery: Secondary | ICD-10-CM | POA: Diagnosis not present

## 2018-08-26 DIAGNOSIS — I129 Hypertensive chronic kidney disease with stage 1 through stage 4 chronic kidney disease, or unspecified chronic kidney disease: Secondary | ICD-10-CM | POA: Diagnosis not present

## 2018-08-26 DIAGNOSIS — E1165 Type 2 diabetes mellitus with hyperglycemia: Secondary | ICD-10-CM | POA: Diagnosis not present

## 2018-08-26 DIAGNOSIS — E782 Mixed hyperlipidemia: Secondary | ICD-10-CM | POA: Diagnosis not present

## 2018-09-11 ENCOUNTER — Encounter (INDEPENDENT_AMBULATORY_CARE_PROVIDER_SITE_OTHER): Payer: Medicare Other | Admitting: Ophthalmology

## 2018-09-11 DIAGNOSIS — I10 Essential (primary) hypertension: Secondary | ICD-10-CM | POA: Diagnosis not present

## 2018-09-11 DIAGNOSIS — H43813 Vitreous degeneration, bilateral: Secondary | ICD-10-CM

## 2018-09-11 DIAGNOSIS — H353231 Exudative age-related macular degeneration, bilateral, with active choroidal neovascularization: Secondary | ICD-10-CM

## 2018-09-11 DIAGNOSIS — H35033 Hypertensive retinopathy, bilateral: Secondary | ICD-10-CM

## 2018-09-12 DIAGNOSIS — R350 Frequency of micturition: Secondary | ICD-10-CM | POA: Diagnosis not present

## 2018-09-30 ENCOUNTER — Encounter (HOSPITAL_COMMUNITY): Payer: Self-pay

## 2018-09-30 ENCOUNTER — Inpatient Hospital Stay (HOSPITAL_COMMUNITY)
Admission: EM | Admit: 2018-09-30 | Discharge: 2018-10-02 | DRG: 190 | Disposition: A | Discharge status: Home Health | Payer: Medicare Other | Attending: Internal Medicine | Admitting: Internal Medicine

## 2018-09-30 ENCOUNTER — Emergency Department (HOSPITAL_COMMUNITY): Payer: Medicare Other

## 2018-09-30 ENCOUNTER — Other Ambulatory Visit: Payer: Self-pay

## 2018-09-30 DIAGNOSIS — N184 Chronic kidney disease, stage 4 (severe): Secondary | ICD-10-CM | POA: Diagnosis present

## 2018-09-30 DIAGNOSIS — I69354 Hemiplegia and hemiparesis following cerebral infarction affecting left non-dominant side: Secondary | ICD-10-CM | POA: Diagnosis not present

## 2018-09-30 DIAGNOSIS — J45901 Unspecified asthma with (acute) exacerbation: Secondary | ICD-10-CM | POA: Diagnosis not present

## 2018-09-30 DIAGNOSIS — I350 Nonrheumatic aortic (valve) stenosis: Secondary | ICD-10-CM

## 2018-09-30 DIAGNOSIS — Z833 Family history of diabetes mellitus: Secondary | ICD-10-CM

## 2018-09-30 DIAGNOSIS — N179 Acute kidney failure, unspecified: Secondary | ICD-10-CM | POA: Diagnosis not present

## 2018-09-30 DIAGNOSIS — I447 Left bundle-branch block, unspecified: Secondary | ICD-10-CM | POA: Diagnosis present

## 2018-09-30 DIAGNOSIS — J40 Bronchitis, not specified as acute or chronic: Secondary | ICD-10-CM

## 2018-09-30 DIAGNOSIS — Z801 Family history of malignant neoplasm of trachea, bronchus and lung: Secondary | ICD-10-CM

## 2018-09-30 DIAGNOSIS — J9601 Acute respiratory failure with hypoxia: Secondary | ICD-10-CM | POA: Diagnosis not present

## 2018-09-30 DIAGNOSIS — H3530 Unspecified macular degeneration: Secondary | ICD-10-CM | POA: Diagnosis present

## 2018-09-30 DIAGNOSIS — R402362 Coma scale, best motor response, obeys commands, at arrival to emergency department: Secondary | ICD-10-CM | POA: Diagnosis present

## 2018-09-30 DIAGNOSIS — E1165 Type 2 diabetes mellitus with hyperglycemia: Secondary | ICD-10-CM | POA: Diagnosis not present

## 2018-09-30 DIAGNOSIS — R079 Chest pain, unspecified: Secondary | ICD-10-CM | POA: Diagnosis not present

## 2018-09-30 DIAGNOSIS — E1142 Type 2 diabetes mellitus with diabetic polyneuropathy: Secondary | ICD-10-CM | POA: Diagnosis present

## 2018-09-30 DIAGNOSIS — E1122 Type 2 diabetes mellitus with diabetic chronic kidney disease: Secondary | ICD-10-CM | POA: Diagnosis present

## 2018-09-30 DIAGNOSIS — Z7902 Long term (current) use of antithrombotics/antiplatelets: Secondary | ICD-10-CM

## 2018-09-30 DIAGNOSIS — R140 Abdominal distension (gaseous): Secondary | ICD-10-CM | POA: Diagnosis not present

## 2018-09-30 DIAGNOSIS — I679 Cerebrovascular disease, unspecified: Secondary | ICD-10-CM

## 2018-09-30 DIAGNOSIS — J441 Chronic obstructive pulmonary disease with (acute) exacerbation: Principal | ICD-10-CM | POA: Diagnosis present

## 2018-09-30 DIAGNOSIS — E785 Hyperlipidemia, unspecified: Secondary | ICD-10-CM | POA: Diagnosis present

## 2018-09-30 DIAGNOSIS — J209 Acute bronchitis, unspecified: Secondary | ICD-10-CM | POA: Diagnosis present

## 2018-09-30 DIAGNOSIS — IMO0002 Reserved for concepts with insufficient information to code with codable children: Secondary | ICD-10-CM | POA: Diagnosis present

## 2018-09-30 DIAGNOSIS — R402252 Coma scale, best verbal response, oriented, at arrival to emergency department: Secondary | ICD-10-CM | POA: Diagnosis present

## 2018-09-30 DIAGNOSIS — Z888 Allergy status to other drugs, medicaments and biological substances status: Secondary | ICD-10-CM

## 2018-09-30 DIAGNOSIS — I129 Hypertensive chronic kidney disease with stage 1 through stage 4 chronic kidney disease, or unspecified chronic kidney disease: Secondary | ICD-10-CM | POA: Diagnosis present

## 2018-09-30 DIAGNOSIS — Z8249 Family history of ischemic heart disease and other diseases of the circulatory system: Secondary | ICD-10-CM

## 2018-09-30 DIAGNOSIS — R0902 Hypoxemia: Secondary | ICD-10-CM | POA: Diagnosis not present

## 2018-09-30 DIAGNOSIS — G8190 Hemiplegia, unspecified affecting unspecified side: Secondary | ICD-10-CM

## 2018-09-30 DIAGNOSIS — E1140 Type 2 diabetes mellitus with diabetic neuropathy, unspecified: Secondary | ICD-10-CM | POA: Diagnosis present

## 2018-09-30 DIAGNOSIS — Z7984 Long term (current) use of oral hypoglycemic drugs: Secondary | ICD-10-CM

## 2018-09-30 DIAGNOSIS — Z87891 Personal history of nicotine dependence: Secondary | ICD-10-CM

## 2018-09-30 DIAGNOSIS — R0689 Other abnormalities of breathing: Secondary | ICD-10-CM | POA: Diagnosis not present

## 2018-09-30 DIAGNOSIS — R000 Tachycardia, unspecified: Secondary | ICD-10-CM | POA: Diagnosis not present

## 2018-09-30 DIAGNOSIS — Z820 Family history of epilepsy and other diseases of the nervous system: Secondary | ICD-10-CM

## 2018-09-30 DIAGNOSIS — I10 Essential (primary) hypertension: Secondary | ICD-10-CM | POA: Diagnosis present

## 2018-09-30 DIAGNOSIS — G467 Other lacunar syndromes: Secondary | ICD-10-CM

## 2018-09-30 DIAGNOSIS — R0602 Shortness of breath: Secondary | ICD-10-CM | POA: Diagnosis not present

## 2018-09-30 DIAGNOSIS — J440 Chronic obstructive pulmonary disease with acute lower respiratory infection: Secondary | ICD-10-CM | POA: Diagnosis present

## 2018-09-30 DIAGNOSIS — R402142 Coma scale, eyes open, spontaneous, at arrival to emergency department: Secondary | ICD-10-CM | POA: Diagnosis present

## 2018-09-30 LAB — CBC WITH DIFFERENTIAL/PLATELET
Abs Immature Granulocytes: 0.03 10*3/uL (ref 0.00–0.07)
Basophils Absolute: 0 10*3/uL (ref 0.0–0.1)
Basophils Relative: 0 %
Eosinophils Absolute: 0.1 10*3/uL (ref 0.0–0.5)
Eosinophils Relative: 1 %
HCT: 33.3 % — ABNORMAL LOW (ref 39.0–52.0)
Hemoglobin: 10.5 g/dL — ABNORMAL LOW (ref 13.0–17.0)
Immature Granulocytes: 0 %
Lymphocytes Relative: 22 %
Lymphs Abs: 1.7 10*3/uL (ref 0.7–4.0)
MCH: 31.1 pg (ref 26.0–34.0)
MCHC: 31.5 g/dL (ref 30.0–36.0)
MCV: 98.5 fL (ref 80.0–100.0)
MONO ABS: 0.5 10*3/uL (ref 0.1–1.0)
Monocytes Relative: 7 %
Neutro Abs: 5.3 10*3/uL (ref 1.7–7.7)
Neutrophils Relative %: 70 %
PLATELETS: 178 10*3/uL (ref 150–400)
RBC: 3.38 MIL/uL — ABNORMAL LOW (ref 4.22–5.81)
RDW: 12.4 % (ref 11.5–15.5)
WBC: 7.7 10*3/uL (ref 4.0–10.5)
nRBC: 0 % (ref 0.0–0.2)

## 2018-09-30 LAB — I-STAT TROPONIN, ED: Troponin i, poc: 0.01 ng/mL (ref 0.00–0.08)

## 2018-09-30 LAB — COMPREHENSIVE METABOLIC PANEL
ALT: 22 U/L (ref 0–44)
ANION GAP: 13 (ref 5–15)
AST: 16 U/L (ref 15–41)
Albumin: 3.3 g/dL — ABNORMAL LOW (ref 3.5–5.0)
Alkaline Phosphatase: 137 U/L — ABNORMAL HIGH (ref 38–126)
BUN: 33 mg/dL — ABNORMAL HIGH (ref 8–23)
CO2: 24 mmol/L (ref 22–32)
Calcium: 8.9 mg/dL (ref 8.9–10.3)
Chloride: 96 mmol/L — ABNORMAL LOW (ref 98–111)
Creatinine, Ser: 2.13 mg/dL — ABNORMAL HIGH (ref 0.61–1.24)
GFR calc non Af Amer: 26 mL/min — ABNORMAL LOW (ref >60)
GFR, EST AFRICAN AMERICAN: 30 mL/min — AB (ref >60)
Glucose, Bld: 269 mg/dL — ABNORMAL HIGH (ref 70–99)
Potassium: 3.9 mmol/L (ref 3.5–5.1)
Sodium: 133 mmol/L — ABNORMAL LOW (ref 135–145)
Total Bilirubin: 0.6 mg/dL (ref 0.3–1.2)
Total Protein: 7 g/dL (ref 6.5–8.1)

## 2018-09-30 LAB — BRAIN NATRIURETIC PEPTIDE: B Natriuretic Peptide: 24.8 pg/mL (ref 0.0–100.0)

## 2018-09-30 LAB — TYPE AND SCREEN
ABO/RH(D): A POS
Antibody Screen: NEGATIVE

## 2018-09-30 LAB — GLUCOSE, CAPILLARY: Glucose-Capillary: 393 mg/dL — ABNORMAL HIGH (ref 70–99)

## 2018-09-30 LAB — PHOSPHORUS: Phosphorus: 4.1 mg/dL (ref 2.5–4.6)

## 2018-09-30 LAB — MAGNESIUM: Magnesium: 2.2 mg/dL (ref 1.7–2.4)

## 2018-09-30 LAB — ABO/RH: ABO/RH(D): A POS

## 2018-09-30 MED ORDER — DARIFENACIN HYDROBROMIDE ER 7.5 MG PO TB24
7.5000 mg | ORAL_TABLET | Freq: Every day | ORAL | Status: DC
  Administered 2018-10-01 – 2018-10-02 (×2): 7.5 mg via ORAL
  Filled 2018-09-30 (×2): qty 1

## 2018-09-30 MED ORDER — POLYVINYL ALCOHOL 1.4 % OP SOLN
1.0000 [drp] | Freq: Three times a day (TID) | OPHTHALMIC | Status: DC
  Administered 2018-09-30 – 2018-10-02 (×5): 1 [drp] via OPHTHALMIC
  Filled 2018-09-30: qty 15

## 2018-09-30 MED ORDER — CYCLOBENZAPRINE HCL 10 MG PO TABS
10.0000 mg | ORAL_TABLET | Freq: Every day | ORAL | Status: DC
  Administered 2018-09-30 – 2018-10-01 (×2): 10 mg via ORAL
  Filled 2018-09-30 (×2): qty 1

## 2018-09-30 MED ORDER — ENOXAPARIN SODIUM 40 MG/0.4ML ~~LOC~~ SOLN: 40.0000 mg | SUBCUTANEOUS | Status: DC

## 2018-09-30 MED ORDER — GLYBURIDE 5 MG PO TABS
10.0000 mg | ORAL_TABLET | Freq: Two times a day (BID) | ORAL | Status: DC
  Administered 2018-10-01: 10 mg via ORAL
  Filled 2018-09-30 (×2): qty 2

## 2018-09-30 MED ORDER — DM-GUAIFENESIN ER 30-600 MG PO TB12
1.0000 | ORAL_TABLET | Freq: Two times a day (BID) | ORAL | Status: DC
  Administered 2018-09-30 – 2018-10-02 (×4): 1 via ORAL
  Filled 2018-09-30 (×4): qty 1

## 2018-09-30 MED ORDER — HYDROCODONE-ACETAMINOPHEN 5-325 MG PO TABS: 1.0000 | ORAL_TABLET | Freq: Two times a day (BID) | ORAL | Status: DC | PRN

## 2018-09-30 MED ORDER — ALBUTEROL SULFATE (2.5 MG/3ML) 0.083% IN NEBU
10.0000 mg | INHALATION_SOLUTION | Freq: Once | RESPIRATORY_TRACT | Status: AC
  Administered 2018-09-30: 10 mg via RESPIRATORY_TRACT
  Filled 2018-09-30: qty 12

## 2018-09-30 MED ORDER — PREDNISONE 20 MG PO TABS: 40.0000 mg | ORAL_TABLET | Freq: Every day | ORAL | Status: DC

## 2018-09-30 MED ORDER — LORAZEPAM 1 MG PO TABS
2.0000 mg | ORAL_TABLET | Freq: Every day | ORAL | Status: DC
  Administered 2018-09-30 – 2018-10-01 (×2): 2 mg via ORAL
  Filled 2018-09-30 (×2): qty 2

## 2018-09-30 MED ORDER — METHYLPREDNISOLONE SODIUM SUCC 125 MG IJ SOLR
125.0000 mg | Freq: Once | INTRAMUSCULAR | Status: AC
  Administered 2018-09-30: 125 mg via INTRAVENOUS
  Filled 2018-09-30: qty 2

## 2018-09-30 MED ORDER — ACETAMINOPHEN 650 MG RE SUPP: 650.0000 mg | Freq: Four times a day (QID) | RECTAL | Status: DC | PRN

## 2018-09-30 MED ORDER — ALBUTEROL SULFATE (2.5 MG/3ML) 0.083% IN NEBU: 2.5000 mg | INHALATION_SOLUTION | RESPIRATORY_TRACT | Status: DC | PRN

## 2018-09-30 MED ORDER — SODIUM CHLORIDE 0.45 % IV SOLN
INTRAVENOUS | Status: DC
  Administered 2018-09-30: via INTRAVENOUS

## 2018-09-30 MED ORDER — METOPROLOL TARTRATE 12.5 MG HALF TABLET
12.5000 mg | ORAL_TABLET | Freq: Two times a day (BID) | ORAL | Status: DC
  Administered 2018-09-30 – 2018-10-02 (×4): 12.5 mg via ORAL
  Filled 2018-09-30 (×4): qty 1

## 2018-09-30 MED ORDER — DULOXETINE HCL 60 MG PO CPEP: 60.0000 mg | ORAL_CAPSULE | ORAL | Status: DC

## 2018-09-30 MED ORDER — LINAGLIPTIN 5 MG PO TABS: 5.0000 mg | ORAL_TABLET | Freq: Every day | ORAL | Status: DC

## 2018-09-30 MED ORDER — DULOXETINE HCL 30 MG PO CPEP
90.0000 mg | ORAL_CAPSULE | Freq: Every day | ORAL | Status: DC
  Administered 2018-09-30 – 2018-10-01 (×2): 90 mg via ORAL
  Filled 2018-09-30 (×2): qty 3

## 2018-09-30 MED ORDER — LEVOFLOXACIN 500 MG PO TABS
500.0000 mg | ORAL_TABLET | ORAL | Status: DC
  Administered 2018-09-30: 500 mg via ORAL
  Filled 2018-09-30: qty 1

## 2018-09-30 MED ORDER — INSULIN ASPART 100 UNIT/ML ~~LOC~~ SOLN
0.0000 [IU] | Freq: Three times a day (TID) | SUBCUTANEOUS | Status: DC
  Administered 2018-10-01 (×2): 3 [IU] via SUBCUTANEOUS
  Administered 2018-10-01: 11 [IU] via SUBCUTANEOUS
  Administered 2018-10-02: 3 [IU] via SUBCUTANEOUS

## 2018-09-30 MED ORDER — POLYETHYLENE GLYCOL 3350 17 G PO PACK
17.0000 g | PACK | Freq: Every day | ORAL | Status: DC
  Administered 2018-10-01 – 2018-10-02 (×2): 17 g via ORAL
  Filled 2018-09-30 (×2): qty 1

## 2018-09-30 MED ORDER — ACETAMINOPHEN 325 MG PO TABS: 650.0000 mg | ORAL_TABLET | Freq: Four times a day (QID) | ORAL | Status: DC | PRN

## 2018-09-30 MED ORDER — PRAVASTATIN SODIUM 40 MG PO TABS
40.0000 mg | ORAL_TABLET | Freq: Every day | ORAL | Status: DC
  Administered 2018-10-01: 40 mg via ORAL
  Filled 2018-09-30: qty 1

## 2018-09-30 MED ORDER — METHYLPREDNISOLONE SODIUM SUCC 40 MG IJ SOLR
40.0000 mg | Freq: Four times a day (QID) | INTRAMUSCULAR | Status: DC
  Administered 2018-09-30 – 2018-10-01 (×2): 40 mg via INTRAVENOUS
  Filled 2018-09-30 (×2): qty 1

## 2018-09-30 MED ORDER — ENOXAPARIN SODIUM 30 MG/0.3ML ~~LOC~~ SOLN
30.0000 mg | Freq: Every day | SUBCUTANEOUS | Status: DC
  Administered 2018-10-01 – 2018-10-02 (×2): 30 mg via SUBCUTANEOUS
  Filled 2018-09-30 (×2): qty 0.3

## 2018-09-30 MED ORDER — MAGNESIUM SULFATE 2 GM/50ML IV SOLN
2.0000 g | Freq: Once | INTRAVENOUS | Status: AC
  Administered 2018-09-30: 2 g via INTRAVENOUS
  Filled 2018-09-30 (×2): qty 50

## 2018-09-30 MED ORDER — AMLODIPINE BESYLATE 5 MG PO TABS
5.0000 mg | ORAL_TABLET | Freq: Every day | ORAL | Status: DC
  Administered 2018-10-01 – 2018-10-02 (×2): 5 mg via ORAL
  Filled 2018-09-30 (×2): qty 1

## 2018-09-30 MED ORDER — INSULIN ASPART 100 UNIT/ML ~~LOC~~ SOLN
8.0000 [IU] | Freq: Once | SUBCUTANEOUS | Status: AC
  Administered 2018-09-30: 8 [IU] via SUBCUTANEOUS

## 2018-09-30 MED ORDER — PRAMIPEXOLE DIHYDROCHLORIDE 0.25 MG PO TABS
0.5000 mg | ORAL_TABLET | Freq: Every day | ORAL | Status: DC
  Administered 2018-09-30 – 2018-10-01 (×2): 0.5 mg via ORAL
  Filled 2018-09-30 (×2): qty 2

## 2018-09-30 MED ORDER — SODIUM CHLORIDE 0.9 % IV SOLN: INTRAVENOUS | Status: DC

## 2018-09-30 MED ORDER — CLOPIDOGREL BISULFATE 75 MG PO TABS
75.0000 mg | ORAL_TABLET | Freq: Every day | ORAL | Status: DC
  Administered 2018-10-01 – 2018-10-02 (×2): 75 mg via ORAL
  Filled 2018-09-30 (×2): qty 1

## 2018-09-30 MED ORDER — IPRATROPIUM-ALBUTEROL 0.5-2.5 (3) MG/3ML IN SOLN
3.0000 mL | Freq: Four times a day (QID) | RESPIRATORY_TRACT | Status: DC
  Administered 2018-10-01 (×4): 3 mL via RESPIRATORY_TRACT
  Filled 2018-09-30 (×4): qty 3

## 2018-09-30 MED ORDER — TAMSULOSIN HCL 0.4 MG PO CAPS
0.4000 mg | ORAL_CAPSULE | Freq: Every day | ORAL | Status: DC
  Administered 2018-10-01: 0.4 mg via ORAL
  Filled 2018-09-30: qty 1

## 2018-09-30 NOTE — H&P (Signed)
History and Physical    Kristopher Zimmerman WUJ:[id number] DOB: 08/24/1923 DOA: 09/30/2018  PCP: Merrilee Seashore, MD  Patient coming from: Home.  I have personally briefly reviewed patient's old medical records in [place]  Chief Complaint: Shortness of breath and cough.  HPI: Kristopher Zimmerman is a 85 y.o. male with medical history significant of type 2 diabetes, hyperlipidemia, hypertension, macular degeneration of right eye, diabetic peripheral neuropathy, chronic renal insufficiency, history of CVA, history of TIA, who is coming to the emergency department due to Progressively worse dyspnea associated with productive cough for brownish looking sputum that has not responded to Mucinex, OTC meds and oral Levaquin for the past 6 days.  Today, the patient became so dyspneic and hypoxic with his O2 sat in the mid 80s on EMS arrival to the scene.  They administered supplemental oxygen, Solu-Medrol 125 mg IV, albuterol 5 and ipratropium 0.5 mg continuous neb.  Him and his wife state that initially his symptoms started as upper respiratory mild symptoms, but subsequently progressed to lower respiratory tract producing cough, wheezing and dyspnea.  Patient's wife states that she initially had the symptoms before the patient.  There has not been any travel history.  He denies fever, complains of chills, fatigue and malaise.  Had mild sore throat initially, but denies at this moment.  No chest pain, palpitations, dizziness, diaphoresis, PND or lower extremity edema.  Denies abdominal pain, nausea, emesis, diarrhea, constipation, melena or hematochezia.  No dysuria, frequency or hematuria.  Denies skin rashes or pruritus.  ED Course: Initial vital signs temperature 98.1 F, pulse 108, respirations 23, blood pressure 154/71 mmHg and O2 sat 100% on aerosol mask.  He was given a continuous neb with 10 mg of albuterol and another dose of Solu-Medrol 125 mg IVP x1.  His white count was 7.7 with 70%  neutrophils, 22% lymphocytes and 7% monocytes.  Hemoglobin is 10.5 g/dL and platelets 178.  Initial troponin was normal as well as his BNP which was 24.8 pg/mL.  His CMP shows a sodium 133, chloride 96 mmol/L.  All other electrolytes are within normal limits.  Glucose 269, BUN 33 and creatinine 2.13 mg/dL.  Baseline creatinine is around 1.6.  Albumin was 3.3 g/dL and alkaline phosphatase 137 units/L.  All other CMP values are within normal limits.  His chest radiograph showed low lung volumes with mild bibasilar airspace disease likely reflecting atelectasis.  Review of Systems: As per HPI otherwise 10 point review of systems negative.   Past Medical History:  Diagnosis Date  . Diabetes mellitus without complication ([place])   . Hyperlipidemia   . Hypertension   . Macular degeneration of right eye   . Neuropathy   . Neuropathy, diabetic ([place])   . Renal insufficiency   . Stroke [facility]) 11-28-2014   Also 07-25-2015  . TIA (transient ischemic attack) 05/06/2016    Past Surgical History:  Procedure Laterality Date  . APPENDECTOMY    . BACK SURGERY    . Broken leg    . CERVICAL DISC SURGERY    . TONSILLECTOMY       reports that he quit smoking about 38 years ago. He has never used smokeless tobacco. He reports current alcohol use of about 1.0 standard drinks of alcohol per week. He reports that he does not use drugs.  Allergies  Allergen Reactions  . Neurontin [Gabapentin] Other (See Comments)    Edema (per [facility])  . Ramipril Cough    (per [facility])  Family History  Problem Relation Age of Onset  . Cirrhosis Father   . Heart Problems Mother        enlarge heart  . Diabetes Brother   . Lung cancer Sister   . Alzheimer's disease Sister   . Heart disease Sister   . Lung cancer Brother    Prior to Admission medications   Medication Sig Start Date End Date Taking? Authorizing Provider  amLODipine (NORVASC) 5 MG tablet Take 1 tablet (5  mg total) by mouth daily. 02/13/17  Yes Lelon Perla, MD  B Complex-C (B-COMPLEX WITH VITAMIN C) tablet Take 1 tablet by mouth daily.   Yes [provider]  Besifloxacin HCl (BESIVANCE) 0.6 % SUSP Place 1 drop into both eyes See admin instructions. Instill one drop into both eyes three times on day of monthly eye injections and four times on the day after   Yes [provider]  Calcium Carb-Cholecalciferol (CALCIUM 600-D PO) Take 1 tablet by mouth daily.   Yes [provider]  Carboxymethylcellulose Sodium (REFRESH LIQUIGEL OP) Place 1 drop into both eyes 3 (three) times daily.    Yes [provider]  Carboxymethylcellulose Sodium (REFRESH LIQUIGEL OP) Place 1 drop into both eyes 3 (three) times daily.   Yes [provider]  clopidogrel (PLAVIX) 75 MG tablet Take 1 tablet (75 mg total) by mouth daily. 07/26/15  Yes Elgergawy, Silver Huguenin, MD  cyclobenzaprine (FLEXERIL) 10 MG tablet Take 10 mg by mouth at bedtime.   Yes [provider]  Dextromethorphan-guaiFENesin (MUCINEX DM PO) Take 1 capsule by mouth every 12 (twelve) hours.   Yes [provider]  DULoxetine (CYMBALTA) 30 MG capsule Take 30 mg by mouth See admin instructions. Take one capsule (30 mg) by mouth with a 60 mg capsule for a total dose of 90 mg - daily at bedtime   Yes [provider]  DULoxetine (CYMBALTA) 60 MG capsule Take 60 mg by mouth See admin instructions. Take one capsule (60 mg) by mouth with a 30 mg capsule for a total dose of 90 mg - daily at bedtime   Yes [provider]  Fe Fum-FePoly-Vit C-Vit B3 (INTEGRA) 62.5-62.5-40-3 MG CAPS Take 1 capsule by mouth daily.    Yes [provider]  glyBURIDE (DIABETA) 5 MG tablet Take 10 mg by mouth 2 (two) times daily with a meal.    Yes [provider]  guaiFENesin (DIABETIC TUSSIN PO) Take 1 Dose by mouth every 12 (twelve) hours.   Yes [provider]  HYDROcodone-acetaminophen  (NORCO/VICODIN) 5-325 MG tablet Take 1 tablet by mouth at bedtime.  09/14/16  Yes [provider]  linagliptin (TRADJENTA) 5 MG TABS tablet Take 5 mg by mouth daily with supper.    Yes [provider]  LORazepam (ATIVAN) 2 MG tablet Take 4 mg by mouth at bedtime.    Yes [provider]  MAGNESIUM CITRATE PO Take 1 tablet by mouth daily.    Yes [provider]  metoprolol tartrate (LOPRESSOR) 25 MG tablet Take 0.5 tablets (12.5 mg total) by mouth 2 (two) times daily. 11/30/14  Yes Tat, Shanon Brow, MD  Multiple Vitamins-Minerals (PRESERVISION AREDS 2) CAPS Take 1 capsule by mouth 2 (two) times daily.    Yes [provider]  polyethylene glycol (MIRALAX / GLYCOLAX) packet Take 17 g by mouth daily. Mix in 8 oz fluid and drink   Yes [provider]  pramipexole (MIRAPEX) 0.5 MG tablet Take 0.5 mg by  mouth at bedtime.    Yes [provider]  pravastatin (PRAVACHOL) 40 MG tablet Take 40 mg by mouth daily with supper.    Yes [provider]  Propylene Glycol (SYSTANE BALANCE) 0.6 % SOLN Place 1 drop into both eyes 3 (three) times daily.    Yes [provider]  solifenacin (VESICARE) 5 MG tablet Take 5 mg by mouth daily after supper.    Yes [provider]  tamsulosin (FLOMAX) 0.4 MG CAPS Take 0.4 mg by mouth daily after supper.    Yes [provider]  vitamin C (ASCORBIC ACID) 500 MG tablet Take 500 mg by mouth daily.   Yes [provider]    Physical Exam: Vitals:   09/30/18 2106 09/30/18 2130 09/30/18 2200 09/30/18 2259  BP: 140/71 131/78 (!) 157/77 (!) 159/78  Pulse: (!) 103 (!) 104 (!) 106 (!) 103  Resp: 19 20 14    Temp:    98.3 F (36.8 C)  TempSrc:    Oral  SpO2: 92%   97%  Weight:    92.8 kg  Height:    5' 9.5" (1.765 m)    Constitutional: Looks acutely ill. Eyes: PERRL,lids and conjunctivae mildly injected.  ENMT: Mucous membranes are mildly dry. Posterior pharynx clear of any exudate or  lesions. Neck: normal, supple, no masses, no thyromegaly Respiratory: Tachypneic in the low to mid 20s.  Decreased breath sounds with bilateral rhonchi and bilateral wheezing, no crackles.  No accessory muscle use.  Cardiovascular: Tachycardic at 105 bpm, no murmurs / rubs / gallops. No extremity edema. 2+ pedal pulses. No carotid bruits.  Abdomen: Distended, positive ventral hernia, soft, no tenderness, no masses palpated. No hepatosplenomegaly. Bowel sounds positive.  Musculoskeletal: no clubbing / cyanosis.  Good ROM, no contractures. Normal muscle tone.  Skin: Multiple raised hyperpigmented patches. Neurologic: CN 2-12 grossly intact. Sensation intact, DTR normal. Strength 5/5 in all 4.  Psychiatric: Normal judgment and insight. Alert and oriented x 3. Normal mood.    Labs on Admission: I have personally reviewed following labs and imaging studies  CBC: Recent Labs  Lab 09/30/18 1750  WBC 7.7  NEUTROABS 5.3  HGB 10.5*  HCT 33.3*  MCV 98.5  PLT 932   Basic Metabolic Panel: Recent Labs  Lab 09/30/18 1750 09/30/18 2100  NA 133*  --   K 3.9  --   CL 96*  --   CO2 24  --   GLUCOSE 269*  --   BUN 33*  --   CREATININE 2.13*  --   CALCIUM 8.9  --   MG  --  2.2  PHOS  --  4.1   GFR: Estimated Creatinine Clearance: 25.1 mL/min (A) (by C-G formula based on SCr of 2.13 mg/dL (H)). Liver Function Tests: Recent Labs  Lab 09/30/18 1750  AST 16  ALT 22  ALKPHOS 137*  BILITOT 0.6  PROT 7.0  ALBUMIN 3.3*   No results for input(s): LIPASE, AMYLASE in the last 168 hours. No results for input(s): AMMONIA in the last 168 hours. Coagulation Profile: No results for input(s): INR, PROTIME in the last 168 hours. Cardiac Enzymes: No results for input(s): CKTOTAL, CKMB, CKMBINDEX, TROPONINI in the last 168 hours. BNP (last 3 results) No results for input(s): PROBNP in the last 8760 hours. HbA1C: No results for input(s): HGBA1C in the last 72 hours. CBG: Recent Labs  Lab  09/30/18 2258  GLUCAP 393*   Lipid Profile: No results for input(s): CHOL, HDL, LDLCALC, TRIG,  CHOLHDL, LDLDIRECT in the last 72 hours. Thyroid Function Tests: No results for input(s): TSH, T4TOTAL, FREET4, T3FREE, THYROIDAB in the last 72 hours. Anemia Panel: No results for input(s): VITAMINB12, FOLATE, FERRITIN, TIBC, IRON, RETICCTPCT in the last 72 hours. Urine analysis:    Component Value Date/Time   COLORURINE YELLOW 11/28/2014 1701   APPEARANCEUR CLEAR 11/28/2014 1701   LABSPEC 1.013 11/28/2014 1701   PHURINE 6.0 11/28/2014 1701   GLUCOSEU NEGATIVE 11/28/2014 1701   HGBUR NEGATIVE 11/28/2014 1701   BILIRUBINUR NEGATIVE 11/28/2014 1701   KETONESUR NEGATIVE 11/28/2014 1701   PROTEINUR NEGATIVE 11/28/2014 1701   UROBILINOGEN 0.2 11/28/2014 1701   NITRITE NEGATIVE 11/28/2014 1701   LEUKOCYTESUR NEGATIVE 11/28/2014 1701    Radiological Exams on Admission: Dg Chest Port 1 View  Result Date: 09/30/2018 CLINICAL DATA:  Left-sided chest pain extending into the upper extremity. EXAM: PORTABLE CHEST 1 VIEW COMPARISON:  Two-view chest x-ray 07/24/2017. FINDINGS: Heart size is normal. Aortic atherosclerosis is again seen. Mild bibasilar airspace disease likely reflects atelectasis. No other significant airspace consolidation is present. Remote left-sided rib fractures are again noted. IMPRESSION: 1. Low lung volumes and mild bibasilar airspace disease likely reflects atelectasis. 2. No other significant airspace disease. 3. Aortic atherosclerosis. Electronically Signed   By: San Morelle M.D.   On: 09/30/2018 17:52    EKG: Independently reviewed Vent. rate 106 BPM PR interval * ms QRS duration 160 ms QT/QTc 376/500 ms P-R-T axes 57 -9 99 Sinus tachycardia Left bundle branch block  Assessment/Plan Principal Problem:   COPD with acute exacerbation (HCC) Observation/telemetry. Continue supplemental oxygen. DuoNeb every 6 hours. Albuterol 2.5 mg neb every 4 hours  PRN. Continue low-dose Solu-Medrol with close monitoring of CBG. Levaquin for bronchitis.  Active Problems:   COPD with acute bronchitis ([place]) As above. Levaquin 500 mg p.o. daily for bronchitis    Essential hypertension Continue amlodipine 5 mg p.o. daily. Continue metoprolol 12.5 mg p.o. twice daily    Aortic stenosis Does not seem to be a factor on symptoms. However, will check echocardiogram.    Neuropathy in diabetes (HCC) Continue Cymbalta 90 mg p.o. bedtime. Norco as needed for pain.    Uncontrolled diabetes mellitus ([place]) Received and will be receiving glucocorticoids. Carbohydrate modified diet. Continue glyburide 10 mg p.o. twice daily. Continue half NS infusion for 20 hours. CBG monitoring with regular insulin sliding scale.    DVT prophylaxis: Lovenox SQ. Code Status: Full code. Family Communication: His wife was present in the room. Disposition Plan: Observation for COPD/acute bronchitis treatment. Consults called: Admission status: Observation/telemetry.   Reubin Milan MD [facility]  09/30/2018, 11:17 PM

## 2018-09-30 NOTE — Progress Notes (Signed)
Patient admitted from ED. Alert and oriented to room and surroundings. Having dyspnea on exertion and increased wheezing. VSS. Blood sugar 393. Admitting provider notified and orderss received. Telemetry applied and verified times two staff. Will continue to monitor.

## 2018-09-30 NOTE — ED Triage Notes (Addendum)
Pt arrives EMS from MD office with c/o shortness of breath over last 6 days where he intermittently coughs up brown phelgm. Denies respiratory hx and has been treated by wife over last 6 days with OTC meds. Given albuterol neb 5mg  x 2 and atrovent 0.5 mg neb with solumedrol 125 mg iv PTA. 87 percent on room air. Now 98 on neb.  Pt denies improvement after treatment by EMS. abndominal distention noted by EMS but pt states norm due to hernia.

## 2018-09-30 NOTE — ED Notes (Signed)
ED TO INPATIENT HANDOFF REPORT  ED Nurse Name and Phone #:  Rodney Langton  [phone]  S Name/Age/Gender Cy Blamer 85 y.o. male Room/Bed: 022C/022C  Code Status   Code Status: Prior  Home/SNF/Other Home Patient oriented to: self, place, time and situation Is this baseline? Yes   Triage Complete: Triage complete  Chief Complaint SOB  Triage Note Pt arrives EMS from MD office with c/o shortness of breath over last 6 days where he intermittently coughs up brown phelgm. Denies respiratory hx and has been treated by wife over last 6 days with OTC meds. Given albuterol neb 5mg  x 2 and atrovent 0.5 mg neb with solumedrol 125 mg iv PTA. 87 percent on room air. Now 98 on neb.  Pt denies improvement after treatment by EMS. abndominal distention noted by EMS but pt states norm due to hernia.   Allergies Allergies  Allergen Reactions  . Neurontin [Gabapentin] Other (See Comments)    Edema (per [facility])  . Ramipril Cough    (per [facility]    Level of Care/Admitting Diagnosis ED Disposition    ED Disposition Condition [facility] Area: [place] [[id number]]  Level of Care: Medical Telemetry [104]  I expect the patient will be discharged within 24 hours: Yes  LOW acuity---Tx typically complete <24 hrs---ACUTE conditions typically can be evaluated <24 hours---LABS likely to return to acceptable levels <24 hours---IS near functional baseline---EXPECTED to return to current living arrangement---NOT newly hypoxic: Meets criteria for 5C-Observation unit  Diagnosis: COPD with acute exacerbation [facility]) [[id number]]  Admitting Physician: Reubin Milan [[id number]]  Attending Physician: Reubin Milan [[id number]]  PT Class (Do Not Modify): Observation [104]  PT Acc Code (Do Not Modify): Observation [10022]       B Medical/Surgery History Past Medical History:  Diagnosis Date  . Diabetes mellitus without  complication ([place])   . Hyperlipidemia   . Hypertension   . Macular degeneration of right eye   . Neuropathy   . Neuropathy, diabetic ([place])   . Renal insufficiency   . Stroke [facility]) 11-28-2014   Also 07-25-2015  . TIA (transient ischemic attack) 05/06/2016   Past Surgical History:  Procedure Laterality Date  . APPENDECTOMY    . BACK SURGERY    . Broken leg    . CERVICAL DISC SURGERY    . TONSILLECTOMY       A IV Location/Drains/Wounds Patient Lines/Drains/Airways Status   Active Line/Drains/Airways    Name:   Placement date:   Placement time:   Site:   Days:   Peripheral IV 09/30/18 Left Antecubital   09/30/18    1714    Antecubital   less than 1          Intake/Output Last 24 hours No intake or output data in the 24 hours ending 09/30/18 2215  Labs/Imaging Results for orders placed or performed during the hospital encounter of 09/30/18 (from the past 48 hour(s))  Brain natriuretic peptide     Status: None   Collection Time: 09/30/18  5:05 PM  Result Value Ref Range   B Natriuretic Peptide 24.8 0.0 - 100.0 pg/mL    Comment: Performed at [facility], [place] [address], [place], [place] [address]  CBC with Differential/Platelet     Status: Abnormal   Collection Time: 09/30/18  5:50 PM  Result Value Ref Range   WBC 7.7 4.0 - 10.5 K/uL   RBC 3.38 (L) 4.22 - 5.81  MIL/uL   Hemoglobin 10.5 (L) 13.0 - 17.0 g/dL   HCT 33.3 (L) 39.0 - 52.0 %   MCV 98.5 80.0 - 100.0 fL   MCH 31.1 26.0 - 34.0 pg   MCHC 31.5 30.0 - 36.0 g/dL   RDW 12.4 11.5 - 15.5 %   Platelets 178 150 - 400 K/uL   nRBC 0.0 0.0 - 0.2 %   Neutrophils Relative % 70 %   Neutro Abs 5.3 1.7 - 7.7 K/uL   Lymphocytes Relative 22 %   Lymphs Abs 1.7 0.7 - 4.0 K/uL   Monocytes Relative 7 %   Monocytes Absolute 0.5 0.1 - 1.0 K/uL   Eosinophils Relative 1 %   Eosinophils Absolute 0.1 0.0 - 0.5 K/uL   Basophils Relative 0 %   Basophils Absolute 0.0 0.0 - 0.1 K/uL   Immature Granulocytes 0 %   Abs Immature  Granulocytes 0.03 0.00 - 0.07 K/uL    Comment: Performed at [place] [address], [place], [place] [address]  Comprehensive metabolic panel     Status: Abnormal   Collection Time: 09/30/18  5:50 PM  Result Value Ref Range   Sodium 133 (L) 135 - 145 mmol/L   Potassium 3.9 3.5 - 5.1 mmol/L   Chloride 96 (L) 98 - 111 mmol/L   CO2 24 22 - 32 mmol/L   Glucose, Bld 269 (H) 70 - 99 mg/dL   BUN 33 (H) 8 - 23 mg/dL   Creatinine, Ser 2.13 (H) 0.61 - 1.24 mg/dL   Calcium 8.9 8.9 - 10.3 mg/dL   Total Protein 7.0 6.5 - 8.1 g/dL   Albumin 3.3 (L) 3.5 - 5.0 g/dL   AST 16 15 - 41 U/L   ALT 22 0 - 44 U/L   Alkaline Phosphatase 137 (H) 38 - 126 U/L   Total Bilirubin 0.6 0.3 - 1.2 mg/dL   GFR calc non Af Amer 26 (L) >60 mL/min   GFR calc Af Amer 30 (L) >60 mL/min   Anion gap 13 5 - 15    Comment: Performed at [facility], [place] [address], [place], [place] [address]  Type and screen     Status: None   Collection Time: 09/30/18  5:50 PM  Result Value Ref Range   ABO/RH(D) A POS    Antibody Screen NEG    Sample Expiration      10/03/2018 Performed at [facility], [place] [address], [place], [place] [address]   ABO/Rh     Status: None   Collection Time: 09/30/18  5:50 PM  Result Value Ref Range   ABO/RH(D)      A POS Performed at [place] [address], [place],  [address]   I-stat troponin, ED     Status: None   Collection Time: 09/30/18  5:59 PM  Result Value Ref Range   Troponin i, poc 0.01 0.00 - 0.08 ng/mL   Comment 3            Comment: Due to the release kinetics of cTnI, a negative result within the first hours of the onset of symptoms does not rule out myocardial infarction with certainty. If myocardial infarction is still suspected, repeat the test at appropriate intervals.   Magnesium     Status: None   Collection Time: 09/30/18  9:00 PM  Result Value Ref Range   Magnesium 2.2 1.7 - 2.4 mg/dL    Comment: Performed at [facility], [place] [address], [place], [place] [address]  Phosphorus     Status: None   Collection Time: 09/30/18  9:00 PM  Result Value Ref Range   Phosphorus 4.1 2.5 - 4.6 mg/dL    Comment: Performed at [facility] [address], [place], [place] [address]   Dg Chest Port 1 View  Result Date: 09/30/2018 CLINICAL DATA:  Left-sided chest pain extending into the upper extremity. EXAM: PORTABLE CHEST 1 VIEW COMPARISON:  Two-view chest x-ray 07/24/2017. FINDINGS: Heart size is normal. Aortic atherosclerosis is again seen. Mild bibasilar airspace disease likely reflects atelectasis. No other significant airspace consolidation is present. Remote left-sided rib fractures are again noted. IMPRESSION: 1. Low lung volumes and mild bibasilar airspace disease likely reflects atelectasis. 2. No other significant airspace disease. 3. Aortic atherosclerosis. Electronically Signed   By: San Morelle M.D.   On: 09/30/2018 17:52    Pending Labs Unresulted Labs (From admission, onward)   None      Vitals/Pain Today's Vitals   09/30/18 2015 09/30/18 2106 09/30/18 2130 09/30/18 2200  BP: (!) 129/59 140/71 131/78 (!) 157/77  Pulse: (!) 101 (!) 103 (!) 104 (!) 106  Resp: (!) 28 19 20 14   Temp:      TempSrc:      SpO2:  92%    Weight:      Height:      PainSc:        Isolation Precautions No active isolations  Medications Medications  0.9 %  sodium chloride infusion (has no administration in time range)  albuterol (PROVENTIL) (2.5 MG/3ML) 0.083% nebulizer solution 10 mg (10 mg Nebulization Given 09/30/18 1813)  methylPREDNISolone sodium succinate (SOLU-MEDROL) 125 mg/2 mL injection 125 mg (125 mg Intravenous Given 09/30/18 2040)    Mobility walks with device Low fall risk   Focused Assessments Cardiac Assessment Handoff:  Cardiac Rhythm: Bundle branch block Lab Results  Component Value Date   TROPONINI <0.03 07/26/2015   No results found for: DDIMER Does the Patient currently  have chest pain? No  , Pulmonary Assessment Handoff:  Lung sounds: Bilateral Breath Sounds: Expiratory wheezes, Diminished(coarse) L Breath Sounds: Rhonchi, Expiratory wheezes R Breath Sounds: Rhonchi, Expiratory wheezes O2 Device: Room Air O2 Flow Rate (L/min): 2 L/min      R Recommendations: See Admitting Provider Note  Report given to:   Additional Notes:  Wife at bedside, NAD, hard at hearing

## 2018-09-30 NOTE — ED Provider Notes (Signed)
[facility] EMERGENCY DEPARTMENT Provider Note   CSN: [id number] Arrival date & time: 09/30/18  1700    History   Chief Complaint No chief complaint on file.   HPI Kristopher Zimmerman is a 85 y.o. male.     85 year old male presents with 1 week history of cough and congestion.  No fever or chills.  No sore throat.  Does note some increased lower extremity edema as well as some orthopnea.  Denies any prior history of CHF.  No chest pain or chest pressure.  Has had some diarrhea but denies any emesis.  EMS was called and patient had wheezing and was given albuterol which is getting improved his symptoms.     Past Medical History:  Diagnosis Date  . Diabetes mellitus without complication ([place])   . Hyperlipidemia   . Hypertension   . Macular degeneration of right eye   . Neuropathy   . Neuropathy, diabetic ([place])   . Renal insufficiency   . Stroke [facility]) 11-28-2014   Also 07-25-2015  . TIA (transient ischemic attack) 05/06/2016    Patient Active Problem List   Diagnosis Date Noted  . TIA (transient ischemic attack) 10/03/2016  . Slurred speech 10/03/2016  . Shaking 10/03/2016  . 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 HLD (hyperlipidemia)   . Acute CVA (cerebrovascular accident) ([place])   . Lacunar infarct, acute ([place]) 03/02/2015  . Diabetic neuropathy ([place]) 03/02/2015  . Stroke ([place]) 11/28/2014  . CVA (cerebral infarction) 11/28/2014  . Diabetes mellitus ([place]) 11/28/2014  . Chronic kidney disease (CKD) stage G2/A1, mildly decreased glomerular filtration rate (GFR) between 60-89 mL/min/1.73 square meter and albuminuria creatinine ratio less than 30 mg/g 11/28/2014  . Neuropathy in diabetes ([place]) 11/28/2014  . Toe fracture, left 11/28/2014  . DM (diabetes mellitus) type II controlled with renal manifestation ([place]) 11/28/2014  . Essential hypertension 05/27/2014  . Hyperlipidemia 05/27/2014  . Aortic stenosis 05/27/2014  . Abnormal electrocardiogram  05/27/2014  . Chest pain 05/27/2014    Past Surgical History:  Procedure Laterality Date  . APPENDECTOMY    . BACK SURGERY    . Broken leg    . CERVICAL DISC SURGERY    . TONSILLECTOMY          Home Medications    Prior to Admission medications   Medication Sig Start Date End Date Taking? Authorizing Provider  amLODipine (NORVASC) 5 MG tablet Take 1 tablet (5 mg total) by mouth daily. 02/13/17   Lelon Perla, MD  B Complex-C (B-COMPLEX WITH VITAMIN C) tablet Take 1 tablet by mouth daily.    [provider]  calcium carbonate (OS-CAL) 600 MG TABS Take 600 mg by mouth daily with breakfast.     [provider]  Carboxymethylcellulose Sodium (REFRESH LIQUIGEL OP) Apply 1 drop to eye 4 (four) times daily.    [provider]  clopidogrel (PLAVIX) 75 MG tablet Take 1 tablet (75 mg total) by mouth daily. 07/26/15   Elgergawy, Silver Huguenin, MD  cyclobenzaprine (FLEXERIL) 10 MG tablet Take 10 mg by mouth at bedtime.    [provider]  DULoxetine (CYMBALTA) 30 MG capsule Take 30 mg by mouth daily. Take with a 60 to make 90    [provider]  DULoxetine (CYMBALTA) 60 MG capsule Take 60 mg by mouth daily. Take with 30 to make 90    [provider]  Fe Fum-FePoly-Vit C-Vit B3 (INTEGRA) 62.5-62.5-40-3 MG CAPS Take by mouth.    [provider]  glyBURIDE (DIABETA) 5 MG tablet Take 5 mg by mouth 2 (two) times daily with a meal.    [provider]  HYDROcodone-acetaminophen (NORCO/VICODIN) 5-325 MG tablet as needed. 09/14/16   [provider]  linagliptin (TRADJENTA) 5 MG TABS tablet Take 5 mg by mouth daily.    [provider]  LORazepam (ATIVAN) 2 MG tablet Take 2 mg by mouth at bedtime.    [provider]  Magnesium Citrate 100 MG TABS Take 1 tablet by mouth daily.    [provider]  metoprolol tartrate (LOPRESSOR) 25 MG tablet Take 0.5 tablets (12.5 mg total) by mouth 2 (two) times daily.  11/30/14   Orson Eva, MD  Multiple Vitamins-Minerals (PRESERVISION AREDS 2) CAPS Take by mouth.    [provider]  pramipexole (MIRAPEX) 0.5 MG tablet Take 0.5 mg by mouth at bedtime.     [provider]  pravastatin (PRAVACHOL) 40 MG tablet Take 40 mg by mouth daily.    [provider]  promethazine-dextromethorphan (PROMETHAZINE-DM) 6.25-15 MG/5ML syrup Take by mouth 4 (four) times daily as needed for cough.    [provider]  Propylene Glycol (SYSTANE BALANCE) 0.6 % SOLN Apply 1 drop to eye 4 (four) times daily.    [provider]  solifenacin (VESICARE) 5 MG tablet Take 5 mg by mouth daily.    [provider]  tamsulosin (FLOMAX) 0.4 MG CAPS Take by mouth.    [provider]  vitamin C (ASCORBIC ACID) 500 MG tablet Take 500 mg by mouth daily.    [provider]    Family History Family History  Problem Relation Age of Onset  . Cirrhosis Father   . Heart Problems Mother        enlarge heart  . Diabetes Brother   . Lung cancer Sister   . Alzheimer's disease Sister   . Heart disease Sister   . Lung cancer Brother     Social History Social History   Tobacco Use  . Smoking status: Former Smoker    Last attempt to quit: 12/22/1979    Years since quitting: 38.8  . Smokeless tobacco: Never Used  Substance Use Topics  . Alcohol use: Yes    Alcohol/week: 1.0 standard drinks    Types: 1 Glasses of wine per week    Comment: wine  . Drug use: No     Allergies   Patient has no known allergies.   Review of Systems Review of Systems  All other systems reviewed and are negative.    Physical Exam Updated Vital Signs BP (!) 154/71 (BP Location: Right Arm)   Pulse (!) 107   Temp 98.1 F (36.7 C) (Oral)   Resp (!) 25   SpO2 100%   Physical Exam Vitals signs and nursing note reviewed.  Constitutional:      General: He is not in acute distress.    Appearance: Normal appearance. He is well-developed.  He is not toxic-appearing.  HENT:     Head: Normocephalic and atraumatic.  Eyes:     General: Lids are normal.     Conjunctiva/sclera: Conjunctivae normal.     Pupils: Pupils are equal, round, and reactive to light.  Neck:     Musculoskeletal: Normal range of motion and neck supple.     Thyroid: No thyroid mass.     Trachea: No tracheal deviation.  Cardiovascular:     Rate and Rhythm: Normal rate and regular rhythm.  Heart sounds: Normal heart sounds. No murmur. No gallop.   Pulmonary:     Effort: Respiratory distress present.     Breath sounds: No stridor. Examination of the right-upper field reveals decreased breath sounds and wheezing. Examination of the left-upper field reveals decreased breath sounds and wheezing. Decreased breath sounds and wheezing present. No rhonchi or rales.  Abdominal:     General: Bowel sounds are normal. There is no distension.     Palpations: Abdomen is soft.     Tenderness: There is no abdominal tenderness. There is no rebound.  Musculoskeletal: Normal range of motion.        General: No tenderness.  Skin:    General: Skin is warm and dry.     Findings: No abrasion or rash.  Neurological:     Mental Status: He is alert and oriented to person, place, and time.     GCS: GCS eye subscore is 4. GCS verbal subscore is 5. GCS motor subscore is 6.     Cranial Nerves: No cranial nerve deficit.     Sensory: No sensory deficit.  Psychiatric:        Speech: Speech normal.        Behavior: Behavior normal.      ED Treatments / Results  Labs (all labs ordered are listed, but only abnormal results are displayed) Labs Reviewed  CBC WITH DIFFERENTIAL/PLATELET  COMPREHENSIVE METABOLIC PANEL  BRAIN NATRIURETIC PEPTIDE  I-STAT TROPONIN, ED    EKG EKG Interpretation  Date/Time:  Monday September 30 2018 17:00:22 EST Ventricular Rate:  106 PR Interval:    QRS Duration: 160 QT Interval:  376 QTC Calculation: 500 R Axis:   -9 Text Interpretation:   Sinus tachycardia Left bundle branch block No significant change since last tracing Confirmed by Lacretia Leigh ([id number]) on 09/30/2018 5:07:50 PM   Radiology No results found.  Procedures Procedures (including critical care time)  Medications Ordered in ED Medications  0.9 %  sodium chloride infusion (has no administration in time range)     Initial Impression / Assessment and Plan / ED Course  I have reviewed the triage vital signs and the nursing notes.  Pertinent labs & imaging results that were available during my care of the patient were reviewed by me and considered in my medical decision making (see chart for details).        Treated with albuterol with Atrovent x2 here.  Also given Solu-Medrol.  Continues to have wheezing.  Chest x-ray without infiltrate.  Does have some mild AKI.  Patient will require hospitalization  Final Clinical Impressions(s) / ED Diagnoses   Final diagnoses:  None    ED Discharge Orders    None       Lacretia Leigh, MD 09/30/18 2026

## 2018-09-30 NOTE — ED Notes (Signed)
RN sent 1 visitor (sps.) back

## 2018-10-01 ENCOUNTER — Observation Stay (HOSPITAL_COMMUNITY): Payer: Medicare Other

## 2018-10-01 DIAGNOSIS — Z833 Family history of diabetes mellitus: Secondary | ICD-10-CM | POA: Diagnosis not present

## 2018-10-01 DIAGNOSIS — R402142 Coma scale, eyes open, spontaneous, at arrival to emergency department: Secondary | ICD-10-CM | POA: Diagnosis present

## 2018-10-01 DIAGNOSIS — I350 Nonrheumatic aortic (valve) stenosis: Secondary | ICD-10-CM | POA: Diagnosis present

## 2018-10-01 DIAGNOSIS — I69354 Hemiplegia and hemiparesis following cerebral infarction affecting left non-dominant side: Secondary | ICD-10-CM | POA: Diagnosis not present

## 2018-10-01 DIAGNOSIS — N184 Chronic kidney disease, stage 4 (severe): Secondary | ICD-10-CM | POA: Diagnosis present

## 2018-10-01 DIAGNOSIS — I10 Essential (primary) hypertension: Secondary | ICD-10-CM | POA: Diagnosis not present

## 2018-10-01 DIAGNOSIS — E1141 Type 2 diabetes mellitus with diabetic mononeuropathy: Secondary | ICD-10-CM | POA: Diagnosis not present

## 2018-10-01 DIAGNOSIS — G8190 Hemiplegia, unspecified affecting unspecified side: Secondary | ICD-10-CM | POA: Diagnosis not present

## 2018-10-01 DIAGNOSIS — J9601 Acute respiratory failure with hypoxia: Secondary | ICD-10-CM | POA: Diagnosis present

## 2018-10-01 DIAGNOSIS — I447 Left bundle-branch block, unspecified: Secondary | ICD-10-CM | POA: Diagnosis present

## 2018-10-01 DIAGNOSIS — Z801 Family history of malignant neoplasm of trachea, bronchus and lung: Secondary | ICD-10-CM | POA: Diagnosis not present

## 2018-10-01 DIAGNOSIS — J441 Chronic obstructive pulmonary disease with (acute) exacerbation: Secondary | ICD-10-CM | POA: Diagnosis present

## 2018-10-01 DIAGNOSIS — Z820 Family history of epilepsy and other diseases of the nervous system: Secondary | ICD-10-CM | POA: Diagnosis not present

## 2018-10-01 DIAGNOSIS — E785 Hyperlipidemia, unspecified: Secondary | ICD-10-CM | POA: Diagnosis present

## 2018-10-01 DIAGNOSIS — J440 Chronic obstructive pulmonary disease with acute lower respiratory infection: Secondary | ICD-10-CM

## 2018-10-01 DIAGNOSIS — E1142 Type 2 diabetes mellitus with diabetic polyneuropathy: Secondary | ICD-10-CM | POA: Diagnosis present

## 2018-10-01 DIAGNOSIS — E1122 Type 2 diabetes mellitus with diabetic chronic kidney disease: Secondary | ICD-10-CM | POA: Diagnosis present

## 2018-10-01 DIAGNOSIS — J40 Bronchitis, not specified as acute or chronic: Secondary | ICD-10-CM | POA: Diagnosis not present

## 2018-10-01 DIAGNOSIS — J209 Acute bronchitis, unspecified: Secondary | ICD-10-CM | POA: Diagnosis not present

## 2018-10-01 DIAGNOSIS — R0602 Shortness of breath: Secondary | ICD-10-CM | POA: Diagnosis not present

## 2018-10-01 DIAGNOSIS — Z888 Allergy status to other drugs, medicaments and biological substances status: Secondary | ICD-10-CM | POA: Diagnosis not present

## 2018-10-01 DIAGNOSIS — Z7984 Long term (current) use of oral hypoglycemic drugs: Secondary | ICD-10-CM | POA: Diagnosis not present

## 2018-10-01 DIAGNOSIS — Z7902 Long term (current) use of antithrombotics/antiplatelets: Secondary | ICD-10-CM | POA: Diagnosis not present

## 2018-10-01 DIAGNOSIS — N179 Acute kidney failure, unspecified: Secondary | ICD-10-CM | POA: Diagnosis present

## 2018-10-01 DIAGNOSIS — Z8249 Family history of ischemic heart disease and other diseases of the circulatory system: Secondary | ICD-10-CM | POA: Diagnosis not present

## 2018-10-01 DIAGNOSIS — R402252 Coma scale, best verbal response, oriented, at arrival to emergency department: Secondary | ICD-10-CM | POA: Diagnosis present

## 2018-10-01 DIAGNOSIS — H3530 Unspecified macular degeneration: Secondary | ICD-10-CM | POA: Diagnosis present

## 2018-10-01 DIAGNOSIS — R402362 Coma scale, best motor response, obeys commands, at arrival to emergency department: Secondary | ICD-10-CM | POA: Diagnosis present

## 2018-10-01 DIAGNOSIS — Z87891 Personal history of nicotine dependence: Secondary | ICD-10-CM | POA: Diagnosis not present

## 2018-10-01 DIAGNOSIS — I129 Hypertensive chronic kidney disease with stage 1 through stage 4 chronic kidney disease, or unspecified chronic kidney disease: Secondary | ICD-10-CM | POA: Diagnosis present

## 2018-10-01 LAB — CBC
HCT: 30.9 % — ABNORMAL LOW (ref 39.0–52.0)
Hemoglobin: 10 g/dL — ABNORMAL LOW (ref 13.0–17.0)
MCH: 30.6 pg (ref 26.0–34.0)
MCHC: 32.4 g/dL (ref 30.0–36.0)
MCV: 94.5 fL (ref 80.0–100.0)
PLATELETS: 183 10*3/uL (ref 150–400)
RBC: 3.27 MIL/uL — AB (ref 4.22–5.81)
RDW: 12.3 % (ref 11.5–15.5)
WBC: 9.1 10*3/uL (ref 4.0–10.5)
nRBC: 0 % (ref 0.0–0.2)

## 2018-10-01 LAB — GLUCOSE, CAPILLARY
GLUCOSE-CAPILLARY: 311 mg/dL — AB (ref 70–99)
Glucose-Capillary: 200 mg/dL — ABNORMAL HIGH (ref 70–99)
Glucose-Capillary: 198 mg/dL — ABNORMAL HIGH (ref 70–99)
Glucose-Capillary: 271 mg/dL — ABNORMAL HIGH (ref 70–99)

## 2018-10-01 LAB — COMPREHENSIVE METABOLIC PANEL
ALK PHOS: 134 U/L — AB (ref 38–126)
ALT: 21 U/L (ref 0–44)
ANION GAP: 12 (ref 5–15)
AST: 18 U/L (ref 15–41)
Albumin: 3 g/dL — ABNORMAL LOW (ref 3.5–5.0)
BUN: 38 mg/dL — ABNORMAL HIGH (ref 8–23)
CALCIUM: 9.3 mg/dL (ref 8.9–10.3)
CO2: 21 mmol/L — ABNORMAL LOW (ref 22–32)
Chloride: 98 mmol/L (ref 98–111)
Creatinine, Ser: 2.26 mg/dL — ABNORMAL HIGH (ref 0.61–1.24)
GFR calc Af Amer: 28 mL/min — ABNORMAL LOW (ref >60)
GFR calc non Af Amer: 24 mL/min — ABNORMAL LOW (ref >60)
Glucose, Bld: 352 mg/dL — ABNORMAL HIGH (ref 70–99)
Potassium: 4.8 mmol/L (ref 3.5–5.1)
SODIUM: 131 mmol/L — AB (ref 135–145)
Total Bilirubin: 0.3 mg/dL (ref 0.3–1.2)
Total Protein: 6.4 g/dL — ABNORMAL LOW (ref 6.5–8.1)

## 2018-10-01 LAB — ECHOCARDIOGRAM COMPLETE
Height: 69.5 in
Weight: 3273.39 oz

## 2018-10-01 MED ORDER — PREDNISONE 10 MG PO TABS: 10.0000 mg | ORAL_TABLET | Freq: Every day | ORAL | Status: DC

## 2018-10-01 MED ORDER — ENSURE MAX PROTEIN PO LIQD
11.0000 [oz_av] | Freq: Two times a day (BID) | ORAL | Status: DC
  Administered 2018-10-01 (×2): 11 [oz_av] via ORAL
  Filled 2018-10-01 (×3): qty 330

## 2018-10-01 MED ORDER — PREDNISONE 20 MG PO TABS
20.0000 mg | ORAL_TABLET | Freq: Every day | ORAL | Status: DC
  Administered 2018-10-02: 20 mg via ORAL
  Filled 2018-10-01: qty 1

## 2018-10-01 NOTE — Evaluation (Signed)
Physical Therapy Evaluation Patient Details Name: Kristopher Zimmerman MRN: [id number] DOB: November 07, 1923 Today's Date: 10/01/2018   History of Present Illness  85 y.o. male presenting with who is coming to the emergency department due to progressively worse dyspnea associated with productive cough for brownish looking sputum. PMH including with type 2 diabetes, hyperlipidemia, HTN, macular degeneration of right eye, diabetic peripheral neuropathy, chronic renal insufficiency, history of CVA, and history of TIA.   Clinical Impression  PTA pt living with wife in single story home with 3 steps to enter, and was independent in mobility and ADLs. Pt currently limited in safe mobility by decreased strength and endurance. Pt currently requires min guard for bed mobility, transfers and ambulation and min A for ascent/descent of 4 steps. PT recommending HHPT level rehab at d/c to improve strength and mobility and improve energy conservation. Pt has used [facility] and was happy with their care, requesting their assistance again. PT will continue to follow acutely.     Follow Up Recommendations Home health PT;Supervision for mobility/OOB    Equipment Recommendations  None recommended by PT       Precautions / Restrictions Precautions Precautions: Fall;Other (comment)(Watch SpO2) Restrictions Weight Bearing Restrictions: No      Mobility  Bed Mobility Overal bed mobility: Needs Assistance Bed Mobility: Supine to Sit     Supine to sit: Min guard;HOB elevated     General bed mobility comments: MIn Guard A for safety and use of rails to push up into sitting  Transfers Overall transfer level: Needs assistance Equipment used: Rolling walker (2 wheeled) Transfers: Sit to/from Stand Sit to Stand: Min guard         General transfer comment: min guard for sit>stand for safety, vc for hand placement  Ambulation/Gait Ambulation/Gait assistance: Min guard Gait Distance (Feet): 15  Feet Assistive device: Rolling walker (2 wheeled) Gait Pattern/deviations: Step-through pattern;Decreased step length - right;Decreased step length - left;Trunk flexed Gait velocity: slowed Gait velocity interpretation: 1.31 - 2.62 ft/sec, indicative of limited community ambulator General Gait Details: min guard for safety, vc for proximity to RW, accustom to using Rollator  Stairs Stairs: Yes Stairs assistance: Min assist Stair Management: One rail Left;Forwards;Alternating pattern;With walker;Sideways Number of Stairs: 4 General stair comments: minA for steadying, pt showed therapist how he climbs stairs with use of rail and RW, due to the unsafe manner of that technique suggested that pt have family member move RW for him and that he place two hands on rail and go up and down the stair sideways         Balance Overall balance assessment: Needs assistance Sitting-balance support: No upper extremity supported;Feet supported Sitting balance-Leahy Scale: Good     Standing balance support: No upper extremity supported;During functional activity Standing balance-Leahy Scale: Poor Standing balance comment: reliant on UE support                             Pertinent Vitals/Pain Faces Pain Scale: No hurt Pain Intervention(s): Monitored during session    Home Living Family/patient expects to be discharged to:: Private residence Living Arrangements: Spouse/significant other Available Help at Discharge: Family;Available 24 hours/day Type of Home: House Home Access: Stairs to enter Entrance Stairs-Rails: Right;Left;Can reach both Entrance Stairs-Number of Steps: 3 Home Layout: One level Home Equipment: Walker - 2 wheels;Shower seat - built in;Wheelchair - manual;Cane - single point;Walker - 4 wheels      Prior Function Level of Independence:  Independent with assistive device(s)         Comments: Uses rollator as needed. ADLs and light IADLs. Has had house keeper  provided by daughter. Wife drives     Hand Dominance   Dominant Hand: Right    Extremity/Trunk Assessment   Upper Extremity Assessment Upper Extremity Assessment: Defer to OT evaluation    Lower Extremity Assessment Lower Extremity Assessment: Generalized weakness    Cervical / Trunk Assessment Cervical / Trunk Assessment: Normal  Communication   Communication: HOH  Cognition Arousal/Alertness: Awake/alert Behavior During Therapy: WFL for tasks assessed/performed Overall Cognitive Status: Within Functional Limits for tasks assessed                                        General Comments General comments (skin integrity, edema, etc.): SaO2 on RA 94% on entry, with ascent/descent of steps SaO2 dropped to 84%O2 but quickly rebounded with sitting to 90%O2        Assessment/Plan    PT Assessment Patient needs continued PT services  PT Problem List Decreased strength;Decreased activity tolerance;Decreased balance;Decreased mobility;Decreased safety awareness;Decreased knowledge of use of DME;Cardiopulmonary status limiting activity       PT Treatment Interventions DME instruction;Gait training;Functional mobility training;Stair training;Therapeutic activities;Therapeutic exercise;Balance training;Patient/family education    PT Goals (Current goals can be found in the Care Plan section)  Acute Rehab PT Goals Patient Stated Goal: "Go back home" PT Goal Formulation: With patient Time For Goal Achievement: 10/15/18 Potential to Achieve Goals: Good    Frequency Min 3X/week    AM-PAC PT "6 Clicks" Mobility  Outcome Measure Help needed turning from your back to your side while in a flat bed without using bedrails?: A Little Help needed moving from lying on your back to sitting on the side of a flat bed without using bedrails?: A Little Help needed moving to and from a bed to a chair (including a wheelchair)?: A Little Help needed standing up from a chair  using your arms (e.g., wheelchair or bedside chair)?: A Little Help needed to walk in hospital room?: A Little Help needed climbing 3-5 steps with a railing? : A Little 6 Click Score: 18    End of Session Equipment Utilized During Treatment: Gait belt Activity Tolerance: Patient tolerated treatment well Patient left: in chair;with call bell/phone within reach Nurse Communication: Mobility status PT Visit Diagnosis: Muscle weakness (generalized) (M62.81);Other abnormalities of gait and mobility (R26.89);Difficulty in walking, not elsewhere classified (R26.2)    Time: 4656-8127 PT Time Calculation (min) (ACUTE ONLY): 27 min   Charges:   PT Evaluation $PT Eval Moderate Complexity: 1 Mod PT Treatments $Gait Training: 8-22 mins        Davarius Ridener B. Migdalia Dk PT, DPT Acute Rehabilitation Services Pager [phone] Office [phone]   [place] 10/01/2018, 2:48 PM

## 2018-10-01 NOTE — Progress Notes (Signed)
Initial Nutrition Assessment  DOCUMENTATION CODES:   Not applicable  INTERVENTION:    Ensure Max po BID, each supplement provides 150 kcal and 30 grams of protein  NUTRITION DIAGNOSIS:   Increased nutrient needs related to acute illness as evidenced by estimated needs  GOAL:   Patient will meet greater than or equal to 90% of their needs  MONITOR:   PO intake, Supplement acceptance, Labs, Skin, Weight trends, I & O's  REASON FOR ASSESSMENT:   Consult COPD Protocol, Assessment of nutrition requirement/status  ASSESSMENT:    85 y.o. Male with PMH significant of type 2 diabetes, diabetic peripheral neuropathy, chronic renal insufficiency, history of CVA, history of TIA, who presented to ED due to progressively worse dyspnea associated with productive cough for brownish looking sputum.   Pt admitted with bronchitis.   RD spoke with pt and wife at bedside. Pt reports he ate breakfast fairly well; just ordered lunch. Per wife, pt was not eating or drinking well for a few days PTA.  Pt drinks Premier Protein nutrition supplements at home. Amenable to receive Ensure Max ([place] equivalent). Labs & medications reviewed. Na 131 (L).  CBG's 393-311.  NUTRITION - FOCUSED PHYSICAL EXAM:    Most Recent Value  Orbital Region  No depletion  Upper Arm Region  No depletion  Thoracic and Lumbar Region  No depletion  Buccal Region  No depletion  Temple Region  No depletion  Clavicle Bone Region  No depletion  Clavicle and Acromion Bone Region  No depletion  Scapular Bone Region  No depletion  Dorsal Hand  No depletion  Patellar Region  No depletion  Anterior Thigh Region  No depletion  Posterior Calf Region  No depletion  Edema (RD Assessment)  None  Hair  Reviewed  Eyes  Reviewed  Mouth  Reviewed  Skin  Reviewed  Nails  Reviewed     Diet Order:   Diet Order            Diet heart healthy/carb modified Room service appropriate? Yes; Fluid consistency: Thin  Diet  effective now             EDUCATION NEEDS:   No education needs have been identified at this time  Skin:  Skin Assessment: Reviewed RN Assessment  Last BM:  3/2  Height:   Ht Readings from Last 1 Encounters:  09/30/18 5' 9.5" (1.765 m)   Weight:   Wt Readings from Last 1 Encounters:  09/30/18 92.8 kg   BMI:  Body mass index is 29.78 kg/m.  Estimated Nutritional Needs:   Kcal:  1800-2000  Protein:  90-105 gm  Fluid:  1.8-2.0 L  Arthur Holms, RD, LDN Pager #: [phone] After-Hours Pager #: [phone]

## 2018-10-01 NOTE — Progress Notes (Signed)
Pt assessed per RT protocol. Pt with mostly clear/diminished/slightly coarse bbs, with coarse upper airway wheeze noted. No obvious respiratory distress at time of treatment.

## 2018-10-01 NOTE — Progress Notes (Addendum)
PROGRESS NOTE        PATIENT DETAILS Name: Kristopher Zimmerman Age: 85 y.o. Sex: male Date of Birth: May 04, 1924 Admit Date: 09/30/2018 Admitting Physician Reubin Milan, MD [id number], Mauro Kaufmann, MD  Brief Narrative: Patient is a 85 y.o. male with past medical history significant for type II diabetes, diabetic peripheral neuropathy, hypertension, chronic renal insufficiency, chronic COPD, hyperlipidemia, and history of TIA, CVA, who presented with progressively worsening dyspnea, productive cough, and congestion x 1 week. Patient first began to experience generalized weakness and URI-like symptoms which progressed to lower respiratory tract with productive cough and dyspnea. He tried Mucinex and OTC medications without relief. Per EMS, O2 sat 87% on RA improved to 98% with nebulizer. He was given Solu-Medrol, Albuterol, and ipratropium continuous neb. Chest x-ray revealed low lung volumes and mild bibasilar airspace disease suggestive of atelectasis. He was admitted to the hospital for COPD with acute exacerbation.  Subjective: Patient sitting comfortably in bedside chair, reports improvement in breathing from yesterday. Positive for chest congestion, shortness of breath with ambulation, and mild lower extremity edema. He denies chest pain, nausea, vomiting, abdominal pain, fever, or chills.   Assessment/Plan: Principal Problem:   COPD with acute exacerbation (HCC) Active Problems:   Essential hypertension   Aortic stenosis   Neuropathy in diabetes (HCC)   Uncontrolled diabetes mellitus (HCC)   COPD with acute bronchitis ([place])  1. COPD with acute exacerbation with hypoxic respiratory failure.  -Pulse ox stable with supplemental O2. Will continue with DuoNeb q6h, albuterol nebulizer q4h as needed for shortness of breath, wheezing. -Plan to discontinue methylprednisolone, add po prednisone 20 mg qd -Incentive spirometry  -Patient reports feeling unsteady and  short of breath with ambulation, ambulating with walker. Plan for PT/OT eval   2. Acute bronchitis -Continue with po Levaquin 500 mg q48h -Mucinex DM q12h for chest congestion and cough   3. Essential HTN -Will continue blood pressure control with po amlodipine bid and po metoprolol bid   4. Uncontrolled DM -CBG remains elevated at 311. Continue po glyburide bid with meals, novolog tid with meals, and po linagliptin qd with dinner  -Patient receiving systemic steroids, will decrease dose and continue with close monitoring of CBG -Dietician consult   5. Diabetic peripheral neuropathy  -Plan for PT/OT eval   6. Aortic stenosis -Echo performed 10/01/18, results pending.   Old records review: outpatient records  Morning labs/Imaging ordered: yes  DVT Prophylaxis: Prophylactic Lovenox  Code Status: Full code  Family Communication: None  Disposition Plan: Remain inpatient pending clinical improvement   Antimicrobial agents: Anti-infectives (From admission, onward)   Start     Dose/Rate Route Frequency Ordered Stop   09/30/18 2315  levofloxacin (LEVAQUIN) tablet 500 mg     500 mg Oral Every 48 hours 09/30/18 2304        Procedures: Echo  CONSULTS: Dietician   MEDICATIONS: Scheduled Meds: . amLODipine  5 mg Oral Daily  . clopidogrel  75 mg Oral Daily  . cyclobenzaprine  10 mg Oral QHS  . darifenacin  7.5 mg Oral Daily  . dextromethorphan-guaiFENesin  1 tablet Oral Q12H  . DULoxetine  90 mg Oral QHS  . enoxaparin (LOVENOX) injection  30 mg Subcutaneous Daily  . glyBURIDE  10 mg Oral BID WC  . insulin aspart  0-15 Units Subcutaneous TID WC  . ipratropium-albuterol  3 mL Nebulization  Q6H  . levofloxacin  500 mg Oral Q48H  . linagliptin  5 mg Oral Q supper  . LORazepam  2 mg Oral QHS  . methylPREDNISolone (SOLU-MEDROL) injection  40 mg Intravenous Q6H   Followed by  . [START ON 10/02/2018] predniSONE  40 mg Oral Q breakfast  . metoprolol tartrate  12.5 mg Oral  BID  . polyethylene glycol  17 g Oral Daily  . polyvinyl alcohol  1 drop Both Eyes TID  . pramipexole  0.5 mg Oral QHS  . pravastatin  40 mg Oral Q supper  . tamsulosin  0.4 mg Oral QPC supper   Continuous Infusions: . sodium chloride 100 mL/hr at 09/30/18 2349   PRN Meds:.acetaminophen **OR** acetaminophen, albuterol, HYDROcodone-acetaminophen   PHYSICAL EXAM: Vital signs: Vitals:   09/30/18 2200 09/30/18 2259 10/01/18 0120 10/01/18 0810  BP: (!) 157/77 (!) 159/78  (!) 153/84  Pulse: (!) 106 (!) 103  98  Resp: 14   16  Temp:  98.3 F (36.8 C)  98.1 F (36.7 C)  TempSrc:  Oral  Oral  SpO2:  97% (!) 88% 93%  Weight:  92.8 kg    Height:  5' 9.5" (1.765 m)     Filed Weights   09/30/18 1724 09/30/18 2259  Weight: 93.4 kg 92.8 kg   Body mass index is 29.78 kg/m.   General: Awake, alert, not in any distress. Appears fatigued. Not toxic looking HEENT: Oral mucosa moist, no scleral icterus. Neck: Supple, no JVD. No cervical LAD.  CV: Regular rate and rhythm. No murmurs, rubs, or gallops.  Pulm: Decreased breath sounds, positive for expiratory wheezing. No accessory muscle use  GI: Non tender and not distended with no guarding Extremities: Mild LE edema. Both legs are warm to touch. 2+ pedal pulses  Neurology: Speech clear Musculoskeletal: No visible joint deformities Skin: No Rash, warm and dry  I have personally reviewed following labs and imaging studies  LABORATORY DATA: CBC: Recent Labs  Lab 09/30/18 1750 10/01/18 0348  WBC 7.7 9.1  NEUTROABS 5.3  --   HGB 10.5* 10.0*  HCT 33.3* 30.9*  MCV 98.5 94.5  PLT 178 160    Basic Metabolic Panel: Recent Labs  Lab 09/30/18 1750 09/30/18 2100 10/01/18 0348  NA 133*  --  131*  K 3.9  --  4.8  CL 96*  --  98  CO2 24  --  21*  GLUCOSE 269*  --  352*  BUN 33*  --  38*  CREATININE 2.13*  --  2.26*  CALCIUM 8.9  --  9.3  MG  --  2.2  --   PHOS  --  4.1  --     GFR: Estimated Creatinine Clearance: 23.7  mL/min (A) (by C-G formula based on SCr of 2.26 mg/dL (H)).  Liver Function Tests: Recent Labs  Lab 09/30/18 1750 10/01/18 0348  AST 16 18  ALT 22 21  ALKPHOS 137* 134*  BILITOT 0.6 0.3  PROT 7.0 6.4*  ALBUMIN 3.3* 3.0*   No results for input(s): LIPASE, AMYLASE in the last 168 hours. No results for input(s): AMMONIA in the last 168 hours.  Coagulation Profile: No results for input(s): INR, PROTIME in the last 168 hours.  Cardiac Enzymes: No results for input(s): CKTOTAL, CKMB, CKMBINDEX, TROPONINI in the last 168 hours.  BNP (last 3 results) No results for input(s): PROBNP in the last 8760 hours.  HbA1C: No results for input(s): HGBA1C in the last 72 hours.  CBG: Recent  Labs  Lab 09/30/18 2258 10/01/18 0742  GLUCAP 393* 311*    Lipid Profile: No results for input(s): CHOL, HDL, LDLCALC, TRIG, CHOLHDL, LDLDIRECT in the last 72 hours.  Thyroid Function Tests: No results for input(s): TSH, T4TOTAL, FREET4, T3FREE, THYROIDAB in the last 72 hours.  Anemia Panel: No results for input(s): VITAMINB12, FOLATE, FERRITIN, TIBC, IRON, RETICCTPCT in the last 72 hours.  Urine analysis:    Component Value Date/Time   COLORURINE YELLOW 11/28/2014 1701   APPEARANCEUR CLEAR 11/28/2014 1701   LABSPEC 1.013 11/28/2014 1701   PHURINE 6.0 11/28/2014 1701   GLUCOSEU NEGATIVE 11/28/2014 1701   HGBUR NEGATIVE 11/28/2014 1701   BILIRUBINUR NEGATIVE 11/28/2014 1701   KETONESUR NEGATIVE 11/28/2014 1701   PROTEINUR NEGATIVE 11/28/2014 1701   UROBILINOGEN 0.2 11/28/2014 1701   NITRITE NEGATIVE 11/28/2014 1701   LEUKOCYTESUR NEGATIVE 11/28/2014 1701    Sepsis Labs: Lactic Acid, Venous No results found for: LATICACIDVEN  MICROBIOLOGY: No results found for this or any previous visit (from the past 240 hour(s)).  RADIOLOGY STUDIES/RESULTS: Dg Chest Port 1 View  Result Date: 09/30/2018 CLINICAL DATA:  Left-sided chest pain extending into the upper extremity. EXAM: PORTABLE CHEST  1 VIEW COMPARISON:  Two-view chest x-ray 07/24/2017. FINDINGS: Heart size is normal. Aortic atherosclerosis is again seen. Mild bibasilar airspace disease likely reflects atelectasis. No other significant airspace consolidation is present. Remote left-sided rib fractures are again noted. IMPRESSION: 1. Low lung volumes and mild bibasilar airspace disease likely reflects atelectasis. 2. No other significant airspace disease. 3. Aortic atherosclerosis. Electronically Signed   By: San Morelle M.D.   On: 09/30/2018 17:52     LOS: 0 days   Deboraha Sprang, PA-S  10/01/2018, 9:14 AM

## 2018-10-01 NOTE — Evaluation (Signed)
Occupational Therapy Evaluation Patient Details Name: Kristopher Zimmerman MRN: [id number] DOB: [birth year] Today's Date: 10/01/2018    History of Present Illness 85 y.o. male presenting with who is coming to the emergency department due to progressively worse dyspnea associated with productive cough for brownish looking sputum. PMH including with type 2 diabetes, hyperlipidemia, HTN, macular degeneration of right eye, diabetic peripheral neuropathy, chronic renal insufficiency, history of CVA, and history of TIA.    Clinical Impression   PTA, pt was living with his wife and was independent with BADLs and light IADLs. Pt reports his daughter pays for a housekeeper for IADLs and wife drives. However, pt also stating he and his wife plan on performing housekeeping themselves and discontinuing the Chartered certified accountant. Pt currently requiring Min A for LB ADLs, toileting, and functional mobility with RW. Pt presenting with decreased strength and balance as well as unproductive cough with activity. SpO2 >90% on RA throughout. Pt would benefit from further acute OT to facilitate safe dc. Pending pt support and progress, recommend dc to home with HHOT for further OT to optimize safety, independence with ADLs, and return to PLOF.      Follow Up Recommendations  Home health OT;Supervision/Assistance - 24 hour    Equipment Recommendations  None recommended by OT    Recommendations for Other Services PT consult     Precautions / Restrictions Precautions Precautions: Fall;Other (comment)(Watch SpO2)      Mobility Bed Mobility Overal bed mobility: Needs Assistance Bed Mobility: Supine to Sit     Supine to sit: Min guard;HOB elevated     General bed mobility comments: MIn GUard A for safety and use of rails to push up into sitting  Transfers Overall transfer level: Needs assistance Equipment used: Rolling walker (2 wheeled) Transfers: Sit to/from Stand Sit to Stand: Min assist         General  transfer comment: Min A to power up into standing. Cues for hand placement    Balance Overall balance assessment: Needs assistance Sitting-balance support: No upper extremity supported;Feet supported Sitting balance-Leahy Scale: Good     Standing balance support: No upper extremity supported;During functional activity Standing balance-Leahy Scale: Poor Standing balance comment: reliant on UE support and physical A during dynamic tasks                           ADL either performed or assessed with clinical judgement   ADL Overall ADL's : Needs assistance/impaired Eating/Feeding: Set up;Supervision/ safety;Sitting   Grooming: Wash/dry face;Standing;Min guard Grooming Details (indicate cue type and reason): Min Guard A for safety Upper Body Bathing: Set up;Supervision/ safety;Sitting   Lower Body Bathing: Min guard;Sit to/from stand;Minimal assistance   Upper Body Dressing : Set up;Supervision/safety;Sitting   Lower Body Dressing: Min guard;Sit to/from stand;Minimal assistance Lower Body Dressing Details (indicate cue type and reason): Pt able to adjust socks while seated at EOB with Min Guard for safety in standing. Pt requiring Min A to power up into standing. Toilet Transfer: Minimal assistance;Ambulation;BSC;RW(over toilet) Toilet Transfer Details (indicate cue type and reason): Min A for power up into standing Toileting- Clothing Manipulation and Hygiene: Minimal assistance;Sit to/from stand Toileting - Clothing Manipulation Details (indicate cue type and reason): Min A for standing balance duirng toilet hygiene     Functional mobility during ADLs: Minimal assistance;Rolling walker General ADL Comments: Pt presenting with decreased strength, balance, and actvity tolerance. Pt eager to be OOB and return home. SpO2 stayed >90 on RA  throughout session.     Vision Baseline Vision/History: Macular Degeneration(Cataract sx. right eye blind) Patient Visual Report: No  change from baseline       Perception     Praxis      Pertinent Vitals/Pain Pain Assessment: Faces Faces Pain Scale: No hurt Pain Intervention(s): Monitored during session     Hand Dominance Right   Extremity/Trunk Assessment Upper Extremity Assessment Upper Extremity Assessment: Generalized weakness   Lower Extremity Assessment Lower Extremity Assessment: Defer to PT evaluation   Cervical / Trunk Assessment Cervical / Trunk Assessment: Normal   Communication Communication Communication: No difficulties   Cognition Arousal/Alertness: Awake/alert Behavior During Therapy: WFL for tasks assessed/performed Overall Cognitive Status: Within Functional Limits for tasks assessed                                     General Comments  SpO2 90 on RA; Rn notified    Exercises     Shoulder Instructions      Home Living Family/patient expects to be discharged to:: Private residence Living Arrangements: Spouse/significant other Available Help at Discharge: Family;Available 24 hours/day Type of Home: House Home Access: Stairs to enter [employer] of Steps: 3 Entrance Stairs-Rails: Right;Left;Can reach both Home Layout: One level     Bathroom Shower/Tub: Occupational psychologist: Standard     Home Equipment: Environmental consultant - 2 wheels;Shower seat - built in;Wheelchair - manual;Cane - single point;Walker - 4 wheels          Prior Functioning/Environment Level of Independence: Independent with assistive device(s)        Comments: Uses rollator as needed. ADLs and light IADLs. Has had house keeper provided by daughter. Wife drives        OT Problem List: Decreased strength;Decreased activity tolerance;Impaired balance (sitting and/or standing);Decreased knowledge of precautions;Decreased knowledge of use of DME or AE;Cardiopulmonary status limiting activity      OT Treatment/Interventions: Self-care/ADL training;Therapeutic  exercise;Energy conservation;DME and/or AE instruction;Therapeutic activities;Patient/family education    OT Goals(Current goals can be found in the care plan section) Acute Rehab OT Goals Patient Stated Goal: "Go back home" OT Goal Formulation: With patient Time For Goal Achievement: 10/15/18 Potential to Achieve Goals: Good  OT Frequency: Min 2X/week   Barriers to D/C:            Co-evaluation              AM-PAC OT "6 Clicks" Daily Activity     Outcome Measure Help from another person eating meals?: None Help from another person taking care of personal grooming?: A Little Help from another person toileting, which includes using toliet, bedpan, or urinal?: A Little Help from another person bathing (including washing, rinsing, drying)?: A Little Help from another person to put on and taking off regular upper body clothing?: A Little Help from another person to put on and taking off regular lower body clothing?: A Little 6 Click Score: 19   End of Session Equipment Utilized During Treatment: Rolling walker Nurse Communication: Mobility status;Other (comment)(SpO2. BM and urination. Cath off)  Activity Tolerance: Patient tolerated treatment well Patient left: in chair;with call bell/phone within reach;with chair alarm set;with nursing/sitter in room  OT Visit Diagnosis: Unsteadiness on feet (R26.81);Other abnormalities of gait and mobility (R26.89);Muscle weakness (generalized) (M62.81)                Time: 0626-9485 OT Time Calculation (min):  37 min Charges:  OT General Charges $OT Visit: 1 Visit OT Evaluation $OT Eval Moderate Complexity: 1 Mod OT Treatments $Self Care/Home Management : 8-22 mins  Fatumata Kashani MSOT, OTR/L Acute Rehab Pager: [phone] Office: [place] 10/01/2018, 8:50 AM

## 2018-10-01 NOTE — Progress Notes (Addendum)
PROGRESS NOTE    Kristopher Zimmerman  FIE:[id number] DOB: April 15, 1924 DOA: 09/30/2018 PCP: Merrilee Seashore, MD    Brief Narrative:  85 year old male who presented with dyspnea and cough.  He does have significant past medical history for type 2 diabetes mellitus, dyslipidemia, hypertension, macular degeneration right eye, diabetic neuropathy, chronic kidney disease, history of CVA with left-sided weakness.  Developed progressive dyspnea, productive cough for the last 6 days prior to hospitalization, symptoms were refractory to outpatient management with Mucinex over-the-counter medications and oral levofloxacin.  When EMS evaluated him his oxygen saturation was in the mid 80s, in the emergency department his temperature was 98.1, pulse rate 108, respiratory 23, blood pressure 154/71, oxygen saturation 100% on a Ventimask.  He was ill looking appearing, his mucous membranes were dry, he had increased work of breathing, tachypnea, decreased breath sounds bilaterally with bilateral wheezing and rhonchi, heart S1-S2 present, tachycardic, abdomen distended, positive ventral hernia, no lower extremity edema.  Sodium 133, potassium 3.9, chloride 96, bicarb 24, glucose 269, BUN 33, creatinine 2.13, white count 7.7, hemoglobin 10.5, Hct 33.3, platelets 178.  His a chest x-ray had bibasilar atelectasis, no infiltrates. EKG 106 bpm, sinus rhythm, normal axis, lateral ST depressions.  Poor R wave progression, left bundle branch block.   Patient was admitted to the hospital with the working diagnosis of acute COPD exacerbation complicated with acute hypoxic respiratory failure.   Assessment & Plan:   Principal Problem:   COPD with acute exacerbation ([place]) Active Problems:   Essential hypertension   Aortic stenosis   Neuropathy in diabetes ([place])   Uncontrolled diabetes mellitus ([place])   COPD with acute bronchitis ([place])   1. Acute COPD exacerbation with acute hypoxic respiratory failure. Symptoms have been  improving but not yet back to baseline, will continue aggressive bronchodilator therapy and systemic steroids, will change to prednisone in am, today had IV methylprednisolone. Out of bed as tolerated. His oxygenation is 91% on room air today. No pneumonic infiltrate, will dc levofloxacin.   2, HTN. Continue blood pressure control with amlodipine and metoprolol.   3. Aortic stenosis. No signs of volume overload, will continue blood pressure monitoring.  4. T2DM with uncontrolled hyperglycemia. Will continue glucose cover and monitoring with insulin sliding scale, taper steroids, will hold on oral antihyperglycemic agents while hospitalized.   5. Hx of CVA with dyslipidemia. Continue clopidogrel and atorvastatin.   6. AKI on CKD stage IV. Renal function with worsening serum cr now up to 2,26 with K at 4,8 and serum bicarbonate at 21. Will follow on renal panel in am, avoid hypotension or nephrotoxic medications. Patient is tolerating po well.   DVT prophylaxis: enoxaparin   Code Status: full Family Communication: I spoke with patient's wife at the bedside and all questions were addressed.   Disposition Plan/ discharge barriers: possible dc in am.   Body mass index is 29.78 kg/m. Malnutrition Type:  Nutrition Problem: Increased nutrient needs Etiology: acute illness   Malnutrition Characteristics:  Signs/Symptoms: estimated needs   Nutrition Interventions:  Interventions: Other (Comment)  RN Pressure Injury Documentation:     Consultants:     Procedures:     Antimicrobials:       Subjective: Patient continue to have dyspnea, improved from yesterday, no chest pain, no nausea or vomiting.   Objective: Vitals:   09/30/18 2259 10/01/18 0120 10/01/18 0810 10/01/18 0851  BP: (!) 159/78  (!) 153/84   Pulse: (!) 103  98   Resp:   16   Temp:  98.3 F (36.8 C)  98.1 F (36.7 C)   TempSrc: Oral  Oral   SpO2: 97% (!) 88% 93% 91%  Weight: 92.8 kg     Height: 5'  9.5" (1.765 m)      No intake or output data in the 24 hours ending 10/01/18 1314 Filed Weights   09/30/18 1724 09/30/18 2259  Weight: 93.4 kg 92.8 kg    Examination:   General: Not in pain or dyspnea, deconditioned  Neurology: Awake and alert, non focal  E ENT: no pallor, no icterus, oral mucosa moist Cardiovascular: No JVD. S1-S2 present, rhythmic, no gallops, rubs, or murmurs. Trace lower extremity edema. Pulmonary: positive breath sounds bilaterally, poor air movement, scattered wheezing, rhonchi and rales. Gastrointestinal. Abdomen protuberant with no organomegaly, non tender, no rebound or guarding Skin. No rashes Musculoskeletal: no joint deformities     Data Reviewed: I have personally reviewed following labs and imaging studies  CBC: Recent Labs  Lab 09/30/18 1750 10/01/18 0348  WBC 7.7 9.1  NEUTROABS 5.3  --   HGB 10.5* 10.0*  HCT 33.3* 30.9*  MCV 98.5 94.5  PLT 178 710   Basic Metabolic Panel: Recent Labs  Lab 09/30/18 1750 09/30/18 2100 10/01/18 0348  NA 133*  --  131*  K 3.9  --  4.8  CL 96*  --  98  CO2 24  --  21*  GLUCOSE 269*  --  352*  BUN 33*  --  38*  CREATININE 2.13*  --  2.26*  CALCIUM 8.9  --  9.3  MG  --  2.2  --   PHOS  --  4.1  --    GFR: Estimated Creatinine Clearance: 23.7 mL/min (A) (by C-G formula based on SCr of 2.26 mg/dL (H)). Liver Function Tests: Recent Labs  Lab 09/30/18 1750 10/01/18 0348  AST 16 18  ALT 22 21  ALKPHOS 137* 134*  BILITOT 0.6 0.3  PROT 7.0 6.4*  ALBUMIN 3.3* 3.0*   No results for input(s): LIPASE, AMYLASE in the last 168 hours. No results for input(s): AMMONIA in the last 168 hours. Coagulation Profile: No results for input(s): INR, PROTIME in the last 168 hours. Cardiac Enzymes: No results for input(s): CKTOTAL, CKMB, CKMBINDEX, TROPONINI in the last 168 hours. BNP (last 3 results) No results for input(s): PROBNP in the last 8760 hours. HbA1C: No results for input(s): HGBA1C in the last  72 hours. CBG: Recent Labs  Lab 09/30/18 2258 10/01/18 0742  GLUCAP 393* 311*   Lipid Profile: No results for input(s): CHOL, HDL, LDLCALC, TRIG, CHOLHDL, LDLDIRECT in the last 72 hours. Thyroid Function Tests: No results for input(s): TSH, T4TOTAL, FREET4, T3FREE, THYROIDAB in the last 72 hours. Anemia Panel: No results for input(s): VITAMINB12, FOLATE, FERRITIN, TIBC, IRON, RETICCTPCT in the last 72 hours.    Radiology Studies: I have reviewed all of the imaging during this hospital visit personally     Scheduled Meds: . amLODipine  5 mg Oral Daily  . clopidogrel  75 mg Oral Daily  . cyclobenzaprine  10 mg Oral QHS  . darifenacin  7.5 mg Oral Daily  . dextromethorphan-guaiFENesin  1 tablet Oral Q12H  . DULoxetine  90 mg Oral QHS  . enoxaparin (LOVENOX) injection  30 mg Subcutaneous Daily  . glyBURIDE  10 mg Oral BID WC  . insulin aspart  0-15 Units Subcutaneous TID WC  . ipratropium-albuterol  3 mL Nebulization Q6H  . levofloxacin  500 mg Oral Q48H  . linagliptin  5 mg Oral Q supper  . LORazepam  2 mg Oral QHS  . metoprolol tartrate  12.5 mg Oral BID  . polyethylene glycol  17 g Oral Daily  . polyvinyl alcohol  1 drop Both Eyes TID  . pramipexole  0.5 mg Oral QHS  . pravastatin  40 mg Oral Q supper  . [START ON 10/02/2018] predniSONE  20 mg Oral Q breakfast  . ENSURE MAX PROTEIN  11 oz Oral BID  . tamsulosin  0.4 mg Oral QPC supper   Continuous Infusions:   LOS: 0 days        Dacoda Finlay Gerome Apley, MD

## 2018-10-01 NOTE — Plan of Care (Signed)

## 2018-10-02 DIAGNOSIS — G8190 Hemiplegia, unspecified affecting unspecified side: Secondary | ICD-10-CM

## 2018-10-02 DIAGNOSIS — E785 Hyperlipidemia, unspecified: Secondary | ICD-10-CM

## 2018-10-02 LAB — BASIC METABOLIC PANEL
Anion gap: 10 (ref 5–15)
BUN: 47 mg/dL — ABNORMAL HIGH (ref 8–23)
CO2: 25 mmol/L (ref 22–32)
Calcium: 9.5 mg/dL (ref 8.9–10.3)
Chloride: 99 mmol/L (ref 98–111)
Creatinine, Ser: 2.1 mg/dL — ABNORMAL HIGH (ref 0.61–1.24)
GFR calc Af Amer: 31 mL/min — ABNORMAL LOW (ref >60)
GFR calc non Af Amer: 27 mL/min — ABNORMAL LOW (ref >60)
Glucose, Bld: 239 mg/dL — ABNORMAL HIGH (ref 70–99)
Potassium: 4.5 mmol/L (ref 3.5–5.1)
Sodium: 134 mmol/L — ABNORMAL LOW (ref 135–145)

## 2018-10-02 LAB — GLUCOSE, CAPILLARY: Glucose-Capillary: 195 mg/dL — ABNORMAL HIGH (ref 70–99)

## 2018-10-02 MED ORDER — ENSURE MAX PROTEIN PO LIQD: 11.0000 [oz_av] | Freq: Two times a day (BID) | ORAL | 0 refills | Status: AC

## 2018-10-02 MED ORDER — ALBUTEROL SULFATE HFA 108 (90 BASE) MCG/ACT IN AERS: 2.0000 | INHALATION_SPRAY | Freq: Four times a day (QID) | RESPIRATORY_TRACT | 2 refills | Status: DC | PRN

## 2018-10-02 MED ORDER — IPRATROPIUM-ALBUTEROL 0.5-2.5 (3) MG/3ML IN SOLN
3.0000 mL | Freq: Three times a day (TID) | RESPIRATORY_TRACT | Status: DC
  Administered 2018-10-02: 3 mL via RESPIRATORY_TRACT
  Filled 2018-10-02: qty 3

## 2018-10-02 MED ORDER — IPRATROPIUM-ALBUTEROL 0.5-2.5 (3) MG/3ML IN SOLN: 3.0000 mL | Freq: Two times a day (BID) | RESPIRATORY_TRACT | Status: DC

## 2018-10-02 MED ORDER — PREDNISONE 20 MG PO TABS: 20.0000 mg | ORAL_TABLET | Freq: Every day | ORAL | 0 refills | Status: AC

## 2018-10-02 NOTE — Care Management Note (Addendum)
Case Management Note  Patient Details  Name: Knute Mazzuca MRN: [id number] Date of Birth: 05/10/1924  Subjective/Objective:    From home with wife, copd, ex.  NCM offered choice with Medicare.gov list, he chose [facility] for [place], [place], states he does not want HHRN,   Put list on shadow chart, referral given to Butch Penny , she will check to make sure she can take referral.  Soc will begin 24-48hrs post dc.  Butch Penny states they can take referral.  Need HHPT/HHOT order with face to face.  DME- has (2) w/chairs, (2) walkers, a cane at home.   Meds- use mail order with [facility]- no problem getting PCP-    Psychologist, occupational- wife Pharmacy- mail order            Action/Plan: DC home when ready, will need HHPT/HHOT orders with face to face.  Expected Discharge Date:                  Expected Discharge Plan:  [place]  In-House Referral:     Discharge planning Services  CM Consult  Post Acute Care Choice:  Home Health Choice offered to:  Patient  DME Arranged:    DME Agency:     HH Arranged:  PT, OT HH Agency:  [facility]  Status of Service:  Completed, signed off  If discussed at [place] of Stay Meetings, dates discussed:    Additional Comments:  Zenon Mayo, RN 10/02/2018, 10:11 AM

## 2018-10-02 NOTE — Progress Notes (Signed)
SATURATION QUALIFICATIONS: (This note is used to comply with regulatory documentation for home oxygen)  Patient Saturations on Room Air at Rest = 94%  Patient Saturations on Room Air while Ambulating = 90%  Patient Saturations on Liters of oxygen while Ambulating =%  Please briefly explain why patient needs home oxygen: Pt ambulated without oxygen and stayed above 90%

## 2018-10-02 NOTE — Discharge Summary (Signed)
Discharge Summary  Kristopher Zimmerman GHW:[id number] DOB: 08-Jan-1924  PCP: Merrilee Seashore, MD  Admit date: 09/30/2018 Discharge date: 10/02/2018   Time spent: < 25 minutes  Admitted From: HOME Disposition:  HOME  Recommendations for Outpatient Follow-up:  1. Follow up with PCP in 1 WEEK 2. Continue prednisone burst, albuterol as needed 3.     Discharge Diagnoses:  Active Hospital Problems   Diagnosis Date Noted  . COPD with acute exacerbation ([place]) 09/30/2018  . Uncontrolled diabetes mellitus ([place]) 09/30/2018  . COPD with acute bronchitis ([place]) 09/30/2018  . Neuropathy in diabetes ([place]) 11/28/2014  . Aortic stenosis 05/27/2014  . Essential hypertension 05/27/2014    Resolved Hospital Problems  No resolved problems to display.    Discharge Condition: Stable   CODE STATUS:full   History of present illness:  Kristopher Zimmerman is a 85 y.o. year old male with medical history significant for type 2 diabetes mellitus, dyslipidemia, hypertension, macular degeneration right eye, diabetic neuropathy, chronic kidney disease, history of CVA with left-sided weakness who presented on 09/30/2018 with a week of intermittent cough productive of brown phlegm and dyspnea that did not respond to outpatient Mucinex and Levaquin and was found to have acute hypoxic respiratory failure secondary to COPD exacerbation. Remaining hospital course addressed in problem based format below:   Hospital Course:   Acute hypoxic respiratory failure secondary to COPD exacerbation Initially required 2 L nasal cannula for SPO2 of 87%.  Chest x-ray consistent with atelectasis with no airspace disease, BNP was unremarkable.  TTE showed preserved EF..  Treated for COPD flare with IV Solu-Medrol and scheduled bronchodilators and has significant improvement.  Briefly was on IV Levaquin for inflammatory treatment related to COPD during hospitalization.  Tolerated transition to oral prednisone.  Will discharge with PRN  albuterol, and prednisone burst (20 mg x 4 days) was able to wean to room air prior to discharge with normal ambulatory oxygen testing  CKD. Stage IV No recent creatinine in system (last 08/04/2015).  Remained stable at 2.13-2.26.  Maintained normal urine output during hospitalization while avoiding nephrotoxins.  Aortic stenosis, stable No changes in valvular pathology on TTE as mentioned above.  History of CVA with dyslipidemia, stable Continue home clopidogrel atorvastatin  Remaining chronic medical problems remained stable  Consultations:  none  Procedures/Studies: TTE, 10/01/18  . The left ventricle has normal systolic function, with an ejection fraction of 55-60%. The cavity size was normal. Left ventricular diastolic Doppler parameters are indeterminate due to nondiagnostic images.  2. The right ventricle has normal systolic function. The cavity was normal. There is no increase in right ventricular wall thickness.  3. Left atrial size was mildly dilated.  4. The mitral valve is normal in structure.  5. The tricuspid valve is normal in structure.  6. The aortic valve is tricuspid Moderate thickening of the aortic valve Moderate calcification of the aortic valve. mild stenosis of the aortic valve.  7. The pulmonic valve was normal in structure  Discharge Exam: BP 133/74 (BP Location: Right Arm)   Pulse 99   Temp 97.6 F (36.4 C) (Oral)   Resp 16   Ht 5' 9.5" (1.765 m)   Wt 92.8 kg   SpO2 92%   BMI 29.78 kg/m   General: Lying in bed, no apparent distress Eyes: EOMI, anicteric ENT: Oral Mucosa clear and moist Cardiovascular: regular rate and rhythm, no murmurs, rubs or gallops, no edema, Respiratory: Normal respiratory effort on room air, scant wheezing otherwise clear breath sounds bilaterally Abdomen: soft,  non-distended, non-tender, normal bowel sounds Skin: No Rash Neurologic: Grossly no focal neuro deficit.Mental status AAOx3, speech normal, Psychiatric:Appropriate  affect, and mood   Discharge Instructions You were cared for by a hospitalist during your hospital stay. If you have any questions about your discharge medications or the care you received while you were in the hospital after you are discharged, you can call the unit and asked to speak with the hospitalist on call if the hospitalist that took care of you is not available. Once you are discharged, your primary care physician will handle any further medical issues. Please note that NO REFILLS for any discharge medications will be authorized once you are discharged, as it is imperative that you return to your primary care physician (or establish a relationship with a primary care physician if you do not have one) for your aftercare needs so that they can reassess your need for medications and monitor your lab values.  Discharge Instructions    Diet - low sodium heart healthy   Complete by:  As directed    Increase activity slowly   Complete by:  As directed      Allergies as of 10/02/2018      Reactions   Neurontin [gabapentin] Other (See Comments)   Edema (per [facility])   Ramipril Cough   (per [facility])      Medication List    TAKE these medications   albuterol 108 (90 Base) MCG/ACT inhaler Commonly known as:  PROVENTIL HFA;VENTOLIN HFA Inhale 2 puffs into the lungs every 6 (six) hours as needed for wheezing or shortness of breath.   amLODipine 5 MG tablet Commonly known as:  NORVASC Take 1 tablet (5 mg total) by mouth daily.   B-complex with vitamin C tablet Take 1 tablet by mouth daily.   BESIVANCE 0.6 % Susp Generic drug:  Besifloxacin HCl Place 1 drop into both eyes See admin instructions. Instill one drop into both eyes three times on day of monthly eye injections and four times on the day after   CALCIUM 600-D PO Take 1 tablet by mouth daily.   clopidogrel 75 MG tablet Commonly known as:  PLAVIX Take 1 tablet (75 mg total) by  mouth daily.   cyclobenzaprine 10 MG tablet Commonly known as:  FLEXERIL Take 10 mg by mouth at bedtime.   DIABETIC TUSSIN PO Take 1 Dose by mouth every 12 (twelve) hours.   DULoxetine 30 MG capsule Commonly known as:  CYMBALTA Take 30 mg by mouth See admin instructions. Take one capsule (30 mg) by mouth with a 60 mg capsule for a total dose of 90 mg - daily at bedtime   DULoxetine 60 MG capsule Commonly known as:  CYMBALTA Take 60 mg by mouth See admin instructions. Take one capsule (60 mg) by mouth with a 30 mg capsule for a total dose of 90 mg - daily at bedtime   ENSURE MAX PROTEIN Liqd Take 330 mLs (11 oz total) by mouth 2 (two) times daily for 7 days.   glyBURIDE 5 MG tablet Commonly known as:  DIABETA Take 10 mg by mouth 2 (two) times daily with a meal.   HYDROcodone-acetaminophen 5-325 MG tablet Commonly known as:  NORCO/VICODIN Take 1 tablet by mouth at bedtime.   INTEGRA 62.5-62.5-40-3 MG Caps Take 1 capsule by mouth daily.   linagliptin 5 MG Tabs tablet Commonly known as:  TRADJENTA Take 5 mg by mouth daily with supper.   LORazepam 2 MG  tablet Commonly known as:  ATIVAN Take 4 mg by mouth at bedtime.   MAGNESIUM CITRATE PO Take 1 tablet by mouth daily.   metoprolol tartrate 25 MG tablet Commonly known as:  LOPRESSOR Take 0.5 tablets (12.5 mg total) by mouth 2 (two) times daily.   MUCINEX DM PO Take 1 capsule by mouth every 12 (twelve) hours.   polyethylene glycol packet Commonly known as:  MIRALAX / GLYCOLAX Take 17 g by mouth daily. Mix in 8 oz fluid and drink   pramipexole 0.5 MG tablet Commonly known as:  MIRAPEX Take 0.5 mg by mouth at bedtime.   pravastatin 40 MG tablet Commonly known as:  PRAVACHOL Take 40 mg by mouth daily with supper.   predniSONE 20 MG tablet Commonly known as:  DELTASONE Take 1 tablet (20 mg total) by mouth daily with breakfast for 4 days. Start taking on:  October 03, 2018   PRESERVISION AREDS 2 Caps Take 1 capsule  by mouth 2 (two) times daily.   REFRESH LIQUIGEL OP Place 1 drop into both eyes 3 (three) times daily.   REFRESH LIQUIGEL OP Place 1 drop into both eyes 3 (three) times daily.   solifenacin 5 MG tablet Commonly known as:  VESICARE Take 5 mg by mouth daily after supper.   SYSTANE BALANCE 0.6 % Soln Generic drug:  Propylene Glycol Place 1 drop into both eyes 3 (three) times daily.   tamsulosin 0.4 MG Caps capsule Commonly known as:  FLOMAX Take 0.4 mg by mouth daily after supper.   vitamin C 500 MG tablet Commonly known as:  ASCORBIC ACID Take 500 mg by mouth daily.      Allergies  Allergen Reactions  . Neurontin [Gabapentin] Other (See Comments)    Edema (per [facility])  . Ramipril Cough    (per [facility])   Follow-up Information    [facility] Follow [facility].   Specialty:  Home Health Services Why:  HHPT, HHOT           The results of significant diagnostics from this hospitalization (including imaging, microbiology, ancillary and laboratory) are listed below for reference.    Significant Diagnostic Studies: Dg Chest Port 1 View  Result Date: 09/30/2018 CLINICAL DATA:  Left-sided chest pain extending into the upper extremity. EXAM: PORTABLE CHEST 1 VIEW COMPARISON:  Two-view chest x-ray 07/24/2017. FINDINGS: Heart size is normal. Aortic atherosclerosis is again seen. Mild bibasilar airspace disease likely reflects atelectasis. No other significant airspace consolidation is present. Remote left-sided rib fractures are again noted. IMPRESSION: 1. Low lung volumes and mild bibasilar airspace disease likely reflects atelectasis. 2. No other significant airspace disease. 3. Aortic atherosclerosis. Electronically Signed   By: San Morelle M.D.   On: 09/30/2018 17:52    Microbiology: No results found for this or any previous visit (from the past 240 hour(s)).   Labs: Basic Metabolic Panel: Recent Labs    Lab 09/30/18 1750 09/30/18 2100 10/01/18 0348 10/02/18 0225  NA 133*  --  131* 134*  K 3.9  --  4.8 4.5  CL 96*  --  98 99  CO2 24  --  21* 25  GLUCOSE 269*  --  352* 239*  BUN 33*  --  38* 47*  CREATININE 2.13*  --  2.26* 2.10*  CALCIUM 8.9  --  9.3 9.5  MG  --  2.2  --   --   PHOS  --  4.1  --   --  Liver Function Tests: Recent Labs  Lab 09/30/18 1750 10/01/18 0348  AST 16 18  ALT 22 21  ALKPHOS 137* 134*  BILITOT 0.6 0.3  PROT 7.0 6.4*  ALBUMIN 3.3* 3.0*   No results for input(s): LIPASE, AMYLASE in the last 168 hours. No results for input(s): AMMONIA in the last 168 hours. CBC: Recent Labs  Lab 09/30/18 1750 10/01/18 0348  WBC 7.7 9.1  NEUTROABS 5.3  --   HGB 10.5* 10.0*  HCT 33.3* 30.9*  MCV 98.5 94.5  PLT 178 183   Cardiac Enzymes: No results for input(s): CKTOTAL, CKMB, CKMBINDEX, TROPONINI in the last 168 hours. BNP: BNP (last 3 results) Recent Labs    09/30/18 1705  BNP 24.8    ProBNP (last 3 results) No results for input(s): PROBNP in the last 8760 hours.  CBG: Recent Labs  Lab 10/01/18 0742 10/01/18 1336 10/01/18 1734 10/01/18 2053 10/02/18 0909  GLUCAP 311* 200* 198* 271* 195*       Signed:  Desiree Hane, MD [facility] 10/02/2018, 12:42 PM

## 2018-10-02 NOTE — Progress Notes (Signed)
Occupational Therapy Treatment Patient Details Name: Kristopher Zimmerman MRN: [id number] DOB: [birth year] Today's Date: 10/02/2018    History of present illness 85 y.o. male presenting with who is coming to the emergency department due to progressively worse dyspnea associated with productive cough for brownish looking sputum. PMH including with type 2 diabetes, hyperlipidemia, HTN, macular degeneration of right eye, diabetic peripheral neuropathy, chronic renal insufficiency, history of CVA, and history of TIA.    OT comments  Pt progressing towards acute OT goals. Focus of session was household distance functional mobility, grooming task in standing, and simulated toilet transfer. Pt min guard to min A for ADLs and transfers. Pt tolerated session fairly well, no SOB noted. D/c plan remains appropriate.   Follow Up Recommendations  Home health OT;Supervision/Assistance - 24 hour    Equipment Recommendations  None recommended by OT    Recommendations for Other Services      Precautions / Restrictions Precautions Precautions: Fall Restrictions Weight Bearing Restrictions: No       Mobility Bed Mobility               General bed mobility comments: up in chair  Transfers Overall transfer level: Needs assistance Equipment used: Rolling walker (2 wheeled) Transfers: Sit to/from Stand Sit to Stand: Min guard;Min assist         General transfer comment: light min A to facilitate initial powerup from recliner. Extra time and effort. pt utilizing momentum. reports he has a lift recliner at home that he uses to help with transfers at times. Discussed pros and cons and overall considerations for when lift chair use might be apporpriate for him from therapy standpoint (energy conservation vs building/maintianing activity tolerance0.    Balance Overall balance assessment: Needs assistance Sitting-balance support: No upper extremity supported;Feet supported Sitting balance-Leahy Scale:  Good     Standing balance support: No upper extremity supported;During functional activity Standing balance-Leahy Scale: Poor Standing balance comment: reliant on UE support                           ADL either performed or assessed with clinical judgement   ADL Overall ADL's : Needs assistance/impaired     Grooming: Wash/dry hands;Min guard;Standing                   Toilet Transfer: Minimal assistance;Ambulation;BSC;RW           Functional mobility during ADLs: Min guard;Rolling walker General ADL Comments: Pt completed household distance functional mobility with 1 brief standing rest break, stood for 1 grooming tasks. Fatigued at end of session but otherwise tolerated session well.     Vision       Perception     Praxis      Cognition Arousal/Alertness: Awake/alert Behavior During Therapy: WFL for tasks assessed/performed Overall Cognitive Status: Within Functional Limits for tasks assessed                                          Exercises     Shoulder Instructions       General Comments      Pertinent Vitals/ Pain       Pain Assessment: Faces Faces Pain Scale: No hurt  Home Living  Prior Functioning/Environment              Frequency  Min 2X/week        Progress Toward Goals  OT Goals(current goals can now be found in the care plan section)  Progress towards OT goals: Progressing toward goals  Acute Rehab OT Goals Patient Stated Goal: "Go back home" OT Goal Formulation: With patient Time For Goal Achievement: 10/15/18 Potential to Achieve Goals: Good ADL Goals Pt Will Perform Grooming: with modified independence;standing Pt Will Perform Lower Body Dressing: with modified independence;sit to/from stand Pt Will Transfer to Toilet: with modified independence;bedside commode;ambulating Pt Will Perform Toileting - Clothing Manipulation and  hygiene: with modified independence;sit to/from stand;sitting/lateral leans Additional ADL Goal #1: Pt will demonstrate increased activity tolerance to perform three grooming tasks standing with supervision  Plan Discharge plan remains appropriate    Co-evaluation                 AM-PAC OT "6 Clicks" Daily Activity     Outcome Measure   Help from another person eating meals?: None Help from another person taking care of personal grooming?: A Little Help from another person toileting, which includes using toliet, bedpan, or urinal?: A Little Help from another person bathing (including washing, rinsing, drying)?: A Little Help from another person to put on and taking off regular upper body clothing?: A Little Help from another person to put on and taking off regular lower body clothing?: A Little 6 Click Score: 19    End of Session Equipment Utilized During Treatment: Rolling walker  OT Visit Diagnosis: Unsteadiness on feet (R26.81);Other abnormalities of gait and mobility (R26.89);Muscle weakness (generalized) (M62.81)   Activity Tolerance Patient tolerated treatment well   Patient Left in chair;with call bell/phone within reach;with chair alarm set;with nursing/sitter in room   Nurse Communication          Time: 2423-5361 OT Time Calculation (min): 11 min  Charges: OT General Charges $OT Visit: 1 Visit OT Treatments $Self Care/Home Management : 8-22 mins  Tyrone Schimke, OT Acute Rehabilitation Services Pager: [phone] Office: [phone]    Hortencia Pilar 10/02/2018, 12:58 PM

## 2018-10-03 DIAGNOSIS — Z7984 Long term (current) use of oral hypoglycemic drugs: Secondary | ICD-10-CM | POA: Diagnosis not present

## 2018-10-03 DIAGNOSIS — J441 Chronic obstructive pulmonary disease with (acute) exacerbation: Secondary | ICD-10-CM | POA: Diagnosis not present

## 2018-10-03 DIAGNOSIS — J9601 Acute respiratory failure with hypoxia: Secondary | ICD-10-CM | POA: Diagnosis not present

## 2018-10-03 DIAGNOSIS — I129 Hypertensive chronic kidney disease with stage 1 through stage 4 chronic kidney disease, or unspecified chronic kidney disease: Secondary | ICD-10-CM | POA: Diagnosis not present

## 2018-10-03 DIAGNOSIS — Z7902 Long term (current) use of antithrombotics/antiplatelets: Secondary | ICD-10-CM | POA: Diagnosis not present

## 2018-10-03 DIAGNOSIS — E1122 Type 2 diabetes mellitus with diabetic chronic kidney disease: Secondary | ICD-10-CM | POA: Diagnosis not present

## 2018-10-03 DIAGNOSIS — E1165 Type 2 diabetes mellitus with hyperglycemia: Secondary | ICD-10-CM | POA: Diagnosis not present

## 2018-10-03 DIAGNOSIS — E1140 Type 2 diabetes mellitus with diabetic neuropathy, unspecified: Secondary | ICD-10-CM | POA: Diagnosis not present

## 2018-10-03 DIAGNOSIS — Z8673 Personal history of transient ischemic attack (TIA), and cerebral infarction without residual deficits: Secondary | ICD-10-CM | POA: Diagnosis not present

## 2018-10-03 DIAGNOSIS — N184 Chronic kidney disease, stage 4 (severe): Secondary | ICD-10-CM | POA: Diagnosis not present

## 2018-10-03 DIAGNOSIS — J440 Chronic obstructive pulmonary disease with acute lower respiratory infection: Secondary | ICD-10-CM | POA: Diagnosis not present

## 2018-10-03 DIAGNOSIS — R350 Frequency of micturition: Secondary | ICD-10-CM | POA: Diagnosis not present

## 2018-10-03 DIAGNOSIS — J209 Acute bronchitis, unspecified: Secondary | ICD-10-CM | POA: Diagnosis not present

## 2018-10-03 DIAGNOSIS — Z87891 Personal history of nicotine dependence: Secondary | ICD-10-CM | POA: Diagnosis not present

## 2018-10-04 DIAGNOSIS — I129 Hypertensive chronic kidney disease with stage 1 through stage 4 chronic kidney disease, or unspecified chronic kidney disease: Secondary | ICD-10-CM | POA: Diagnosis not present

## 2018-10-04 DIAGNOSIS — J209 Acute bronchitis, unspecified: Secondary | ICD-10-CM | POA: Diagnosis not present

## 2018-10-04 DIAGNOSIS — J440 Chronic obstructive pulmonary disease with acute lower respiratory infection: Secondary | ICD-10-CM | POA: Diagnosis not present

## 2018-10-04 DIAGNOSIS — J441 Chronic obstructive pulmonary disease with (acute) exacerbation: Secondary | ICD-10-CM | POA: Diagnosis not present

## 2018-10-04 DIAGNOSIS — J9601 Acute respiratory failure with hypoxia: Secondary | ICD-10-CM | POA: Diagnosis not present

## 2018-10-04 DIAGNOSIS — E1140 Type 2 diabetes mellitus with diabetic neuropathy, unspecified: Secondary | ICD-10-CM | POA: Diagnosis not present

## 2018-10-09 ENCOUNTER — Encounter (INDEPENDENT_AMBULATORY_CARE_PROVIDER_SITE_OTHER): Payer: Medicare Other | Admitting: Ophthalmology

## 2018-10-09 ENCOUNTER — Other Ambulatory Visit: Payer: Self-pay

## 2018-10-09 DIAGNOSIS — J440 Chronic obstructive pulmonary disease with acute lower respiratory infection: Secondary | ICD-10-CM | POA: Diagnosis not present

## 2018-10-09 DIAGNOSIS — J209 Acute bronchitis, unspecified: Secondary | ICD-10-CM | POA: Diagnosis not present

## 2018-10-09 DIAGNOSIS — I10 Essential (primary) hypertension: Secondary | ICD-10-CM

## 2018-10-09 DIAGNOSIS — H43813 Vitreous degeneration, bilateral: Secondary | ICD-10-CM | POA: Diagnosis not present

## 2018-10-09 DIAGNOSIS — H35033 Hypertensive retinopathy, bilateral: Secondary | ICD-10-CM

## 2018-10-09 DIAGNOSIS — E1140 Type 2 diabetes mellitus with diabetic neuropathy, unspecified: Secondary | ICD-10-CM | POA: Diagnosis not present

## 2018-10-09 DIAGNOSIS — J9601 Acute respiratory failure with hypoxia: Secondary | ICD-10-CM | POA: Diagnosis not present

## 2018-10-09 DIAGNOSIS — H353231 Exudative age-related macular degeneration, bilateral, with active choroidal neovascularization: Secondary | ICD-10-CM

## 2018-10-10 DIAGNOSIS — J441 Chronic obstructive pulmonary disease with (acute) exacerbation: Secondary | ICD-10-CM | POA: Diagnosis not present

## 2018-10-10 DIAGNOSIS — J209 Acute bronchitis, unspecified: Secondary | ICD-10-CM | POA: Diagnosis not present

## 2018-10-10 DIAGNOSIS — J440 Chronic obstructive pulmonary disease with acute lower respiratory infection: Secondary | ICD-10-CM | POA: Diagnosis not present

## 2018-10-10 DIAGNOSIS — J9601 Acute respiratory failure with hypoxia: Secondary | ICD-10-CM | POA: Diagnosis not present

## 2018-10-10 DIAGNOSIS — I129 Hypertensive chronic kidney disease with stage 1 through stage 4 chronic kidney disease, or unspecified chronic kidney disease: Secondary | ICD-10-CM | POA: Diagnosis not present

## 2018-10-10 DIAGNOSIS — E1140 Type 2 diabetes mellitus with diabetic neuropathy, unspecified: Secondary | ICD-10-CM | POA: Diagnosis not present

## 2018-10-11 DIAGNOSIS — J9601 Acute respiratory failure with hypoxia: Secondary | ICD-10-CM | POA: Diagnosis not present

## 2018-10-11 DIAGNOSIS — I129 Hypertensive chronic kidney disease with stage 1 through stage 4 chronic kidney disease, or unspecified chronic kidney disease: Secondary | ICD-10-CM | POA: Diagnosis not present

## 2018-10-11 DIAGNOSIS — J209 Acute bronchitis, unspecified: Secondary | ICD-10-CM | POA: Diagnosis not present

## 2018-10-11 DIAGNOSIS — J441 Chronic obstructive pulmonary disease with (acute) exacerbation: Secondary | ICD-10-CM | POA: Diagnosis not present

## 2018-10-11 DIAGNOSIS — E1140 Type 2 diabetes mellitus with diabetic neuropathy, unspecified: Secondary | ICD-10-CM | POA: Diagnosis not present

## 2018-10-11 DIAGNOSIS — J440 Chronic obstructive pulmonary disease with acute lower respiratory infection: Secondary | ICD-10-CM | POA: Diagnosis not present

## 2018-10-13 ENCOUNTER — Other Ambulatory Visit: Payer: Self-pay

## 2018-10-13 ENCOUNTER — Encounter (HOSPITAL_COMMUNITY): Payer: Self-pay

## 2018-10-13 ENCOUNTER — Emergency Department (HOSPITAL_COMMUNITY)
Admission: EM | Admit: 2018-10-13 | Discharge: 2018-10-14 | Disposition: A | Discharge status: Home / Self Care | Payer: Medicare Other | Attending: Emergency Medicine | Admitting: Emergency Medicine

## 2018-10-13 ENCOUNTER — Emergency Department (HOSPITAL_COMMUNITY): Payer: Medicare Other

## 2018-10-13 DIAGNOSIS — J441 Chronic obstructive pulmonary disease with (acute) exacerbation: Secondary | ICD-10-CM | POA: Diagnosis not present

## 2018-10-13 DIAGNOSIS — R739 Hyperglycemia, unspecified: Secondary | ICD-10-CM

## 2018-10-13 DIAGNOSIS — I129 Hypertensive chronic kidney disease with stage 1 through stage 4 chronic kidney disease, or unspecified chronic kidney disease: Secondary | ICD-10-CM | POA: Diagnosis not present

## 2018-10-13 DIAGNOSIS — J209 Acute bronchitis, unspecified: Secondary | ICD-10-CM | POA: Diagnosis not present

## 2018-10-13 DIAGNOSIS — N189 Chronic kidney disease, unspecified: Secondary | ICD-10-CM | POA: Diagnosis not present

## 2018-10-13 DIAGNOSIS — E1165 Type 2 diabetes mellitus with hyperglycemia: Secondary | ICD-10-CM | POA: Diagnosis not present

## 2018-10-13 DIAGNOSIS — I10 Essential (primary) hypertension: Secondary | ICD-10-CM | POA: Diagnosis not present

## 2018-10-13 LAB — COMPREHENSIVE METABOLIC PANEL
ALT: 378 U/L — ABNORMAL HIGH (ref 0–44)
AST: 106 U/L — ABNORMAL HIGH (ref 15–41)
Albumin: 2.9 g/dL — ABNORMAL LOW (ref 3.5–5.0)
Alkaline Phosphatase: 144 U/L — ABNORMAL HIGH (ref 38–126)
Anion gap: 5 (ref 5–15)
BUN: 59 mg/dL — ABNORMAL HIGH (ref 8–23)
CALCIUM: 8.7 mg/dL — AB (ref 8.9–10.3)
CO2: 30 mmol/L (ref 22–32)
Chloride: 97 mmol/L — ABNORMAL LOW (ref 98–111)
Creatinine, Ser: 2.12 mg/dL — ABNORMAL HIGH (ref 0.61–1.24)
GFR calc Af Amer: 30 mL/min — ABNORMAL LOW (ref >60)
GFR calc non Af Amer: 26 mL/min — ABNORMAL LOW (ref >60)
Glucose, Bld: 560 mg/dL (ref 70–99)
Potassium: 4.6 mmol/L (ref 3.5–5.1)
Sodium: 132 mmol/L — ABNORMAL LOW (ref 135–145)
Total Bilirubin: 0.5 mg/dL (ref 0.3–1.2)
Total Protein: 5.8 g/dL — ABNORMAL LOW (ref 6.5–8.1)

## 2018-10-13 LAB — URINALYSIS, ROUTINE W REFLEX MICROSCOPIC
Bacteria, UA: NONE SEEN
Bilirubin Urine: NEGATIVE
Glucose, UA: >=500 mg/dL — AB
HGB URINE DIPSTICK: NEGATIVE
Ketones, ur: NEGATIVE mg/dL
Leukocytes,Ua: NEGATIVE
Nitrite: NEGATIVE
Protein, ur: 30 mg/dL — AB
Specific Gravity, Urine: 1.017 (ref 1.005–1.030)
pH: 5 (ref 5.0–8.0)

## 2018-10-13 LAB — CBG MONITORING, ED
GLUCOSE-CAPILLARY: 531 mg/dL — AB (ref 70–99)
GLUCOSE-CAPILLARY: 342 mg/dL — AB (ref 70–99)
Glucose-Capillary: 303 mg/dL — ABNORMAL HIGH (ref 70–99)
Glucose-Capillary: 347 mg/dL — ABNORMAL HIGH (ref 70–99)

## 2018-10-13 LAB — CBC WITH DIFFERENTIAL/PLATELET
Abs Immature Granulocytes: 0.1 10*3/uL — ABNORMAL HIGH (ref 0.00–0.07)
BASOS PCT: 0 %
Basophils Absolute: 0 10*3/uL (ref 0.0–0.1)
Eosinophils Absolute: 0.1 10*3/uL (ref 0.0–0.5)
Eosinophils Relative: 1 %
HCT: 32.5 % — ABNORMAL LOW (ref 39.0–52.0)
Hemoglobin: 10.6 g/dL — ABNORMAL LOW (ref 13.0–17.0)
Immature Granulocytes: 1 %
Lymphocytes Relative: 7 %
Lymphs Abs: 0.9 10*3/uL (ref 0.7–4.0)
MCH: 31.7 pg (ref 26.0–34.0)
MCHC: 32.6 g/dL (ref 30.0–36.0)
MCV: 97.3 fL (ref 80.0–100.0)
MONOS PCT: 6 %
Monocytes Absolute: 0.8 10*3/uL (ref 0.1–1.0)
Neutro Abs: 11.3 10*3/uL — ABNORMAL HIGH (ref 1.7–7.7)
Neutrophils Relative %: 85 %
Platelets: 202 10*3/uL (ref 150–400)
RBC: 3.34 MIL/uL — ABNORMAL LOW (ref 4.22–5.81)
RDW: 12.4 % (ref 11.5–15.5)
WBC: 13.2 10*3/uL — ABNORMAL HIGH (ref 4.0–10.5)
nRBC: 0 % (ref 0.0–0.2)

## 2018-10-13 LAB — POCT I-STAT EG7
Acid-base deficit: 1 mmol/L (ref 0.0–2.0)
Bicarbonate: 26.2 mmol/L (ref 20.0–28.0)
Calcium, Ion: 1.2 mmol/L (ref 1.15–1.40)
HCT: 31 % — ABNORMAL LOW (ref 39.0–52.0)
Hemoglobin: 10.5 g/dL — ABNORMAL LOW (ref 13.0–17.0)
O2 Saturation: 28 %
PCO2 VEN: 53.5 mmHg (ref 44.0–60.0)
Potassium: 4.1 mmol/L (ref 3.5–5.1)
Sodium: 135 mmol/L (ref 135–145)
TCO2: 28 mmol/L (ref 22–32)
pH, Ven: 7.298 (ref 7.250–7.430)
pO2, Ven: 21 mmHg — CL (ref 32.0–45.0)

## 2018-10-13 MED ORDER — SODIUM CHLORIDE 0.9 % IV BOLUS
1000.0000 mL | Freq: Once | INTRAVENOUS | Status: AC
  Administered 2018-10-13: 1000 mL via INTRAVENOUS

## 2018-10-13 MED ORDER — INSULIN ASPART 100 UNIT/ML ~~LOC~~ SOLN
5.0000 [IU] | Freq: Once | SUBCUTANEOUS | Status: AC
  Administered 2018-10-13: 5 [IU] via INTRAVENOUS

## 2018-10-13 NOTE — ED Provider Notes (Signed)
[facility] EMERGENCY DEPARTMENT Provider Note   CSN: [id number] Arrival date & time: 10/13/18  1934    History   Chief Complaint Chief Complaint  Patient presents with  . Hyperglycemia    HPI Kristopher Zimmerman is a 85 y.o. male.     The history is provided by the patient.  Hyperglycemia  Blood sugar level PTA:  >500 Severity:  Moderate Onset quality:  Gradual Timing:  Constant Progression:  Unchanged Chronicity:  New Diabetes status:  Controlled with oral medications Context comment:  Currently finishing prednisone taper, started long acting insulin several days ago due to elevated blood sugar. Greater than 500 and told to come to ED from PCP.  Relieved by:  Nothing Ineffective treatments:  None tried Associated symptoms: no abdominal pain, no blurred vision, no chest pain, no dizziness, no dysuria, no fatigue, no fever, no increased appetite, no increased thirst, no nausea, no shortness of breath, no syncope, no vomiting and no weakness   Risk factors: no hx of DKA     Past Medical History:  Diagnosis Date  . Diabetes mellitus without complication ([place])   . Hyperlipidemia   . Hypertension   . Macular degeneration of right eye   . Neuropathy   . Neuropathy, diabetic ([place])   . Renal insufficiency   . Stroke [facility]) 11-28-2014   Also 07-25-2015  . TIA (transient ischemic attack) 05/06/2016    Patient Active Problem List   Diagnosis Date Noted  . COPD with acute exacerbation ([place]) 09/30/2018  . Uncontrolled diabetes mellitus ([place]) 09/30/2018  . COPD with acute bronchitis ([place]) 09/30/2018  . TIA (transient ischemic attack) 10/03/2016  . Slurred speech 10/03/2016  . Shaking 10/03/2016  . 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 HLD (hyperlipidemia)   . Acute CVA (cerebrovascular accident) ([place])   . Lacunar infarct, acute ([place]) 03/02/2015  . Diabetic neuropathy ([place]) 03/02/2015  . Stroke ([place]) 11/28/2014  . CVA (cerebral infarction)  11/28/2014  . Diabetes mellitus ([place]) 11/28/2014  . Chronic kidney disease (CKD) stage G2/A1, mildly decreased glomerular filtration rate (GFR) between 60-89 mL/min/1.73 square meter and albuminuria creatinine ratio less than 30 mg/g 11/28/2014  . Neuropathy in diabetes ([place]) 11/28/2014  . Toe fracture, left 11/28/2014  . DM (diabetes mellitus) type II controlled with renal manifestation ([place]) 11/28/2014  . Essential hypertension 05/27/2014  . Hyperlipidemia 05/27/2014  . Aortic stenosis 05/27/2014  . Abnormal electrocardiogram 05/27/2014  . Chest pain 05/27/2014    Past Surgical History:  Procedure Laterality Date  . APPENDECTOMY    . BACK SURGERY    . Broken leg    . CERVICAL DISC SURGERY    . TONSILLECTOMY          Home Medications    Prior to Admission medications   Medication Sig Start Date End Date Taking? Authorizing Provider  albuterol (PROVENTIL HFA;VENTOLIN HFA) 108 (90 Base) MCG/ACT inhaler Inhale 2 puffs into the lungs every 6 (six) hours as needed for wheezing or shortness of breath. 10/02/18   Oretha Milch D, MD  amLODipine (NORVASC) 5 MG tablet Take 1 tablet (5 mg total) by mouth daily. 02/13/17   Lelon Perla, MD  B Complex-C (B-COMPLEX WITH VITAMIN C) tablet Take 1 tablet by mouth daily.    [provider]  Besifloxacin HCl (BESIVANCE) 0.6 % SUSP Place 1 drop into both eyes See admin instructions. Instill one drop into both eyes three times on day of monthly eye injections and four times on the day  after    [provider]  Calcium Carb-Cholecalciferol (CALCIUM 600-D PO) Take 1 tablet by mouth daily.    [provider]  Carboxymethylcellulose Sodium (REFRESH LIQUIGEL OP) Place 1 drop into both eyes 3 (three) times daily.     [provider]  Carboxymethylcellulose Sodium (REFRESH LIQUIGEL OP) Place 1 drop into both eyes 3 (three) times daily.    [provider]  clopidogrel (PLAVIX) 75 MG tablet Take 1 tablet (75 mg  total) by mouth daily. 07/26/15   Elgergawy, Silver Huguenin, MD  cyclobenzaprine (FLEXERIL) 10 MG tablet Take 10 mg by mouth at bedtime.    [provider]  Dextromethorphan-guaiFENesin [facility] DM PO) Take 1 capsule by mouth every 12 (twelve) hours.    [provider]  DULoxetine (CYMBALTA) 30 MG capsule Take 30 mg by mouth See admin instructions. Take one capsule (30 mg) by mouth with a 60 mg capsule for a total dose of 90 mg - daily at bedtime    [provider]  DULoxetine (CYMBALTA) 60 MG capsule Take 60 mg by mouth See admin instructions. Take one capsule (60 mg) by mouth with a 30 mg capsule for a total dose of 90 mg - daily at bedtime    [provider]  Fe Fum-FePoly-Vit C-Vit B3 (INTEGRA) 62.5-62.5-40-3 MG CAPS Take 1 capsule by mouth daily.     [provider]  glyBURIDE (DIABETA) 5 MG tablet Take 10 mg by mouth 2 (two) times daily with a meal.     [provider]  guaiFENesin (DIABETIC TUSSIN PO) Take 1 Dose by mouth every 12 (twelve) hours.    [provider]  HYDROcodone-acetaminophen (NORCO/VICODIN) 5-325 MG tablet Take 1 tablet by mouth at bedtime.  09/14/16   [provider]  linagliptin (TRADJENTA) 5 MG TABS tablet Take 5 mg by mouth daily with supper.     [provider]  LORazepam (ATIVAN) 2 MG tablet Take 4 mg by mouth at bedtime.     [provider]  MAGNESIUM CITRATE PO Take 1 tablet by mouth daily.     [provider]  metoprolol tartrate (LOPRESSOR) 25 MG tablet Take 0.5 tablets (12.5 mg total) by mouth 2 (two) times daily. 11/30/14   Orson Eva, MD  Multiple Vitamins-Minerals (PRESERVISION AREDS 2) CAPS Take 1 capsule by mouth 2 (two) times daily.     [provider]  polyethylene glycol (MIRALAX / GLYCOLAX) packet Take 17 g by mouth daily. Mix in 8 oz fluid and drink    [provider]  pramipexole (MIRAPEX) 0.5 MG tablet Take 0.5 mg by mouth at bedtime.     [provider]  pravastatin (PRAVACHOL) 40 MG tablet Take 40 mg by mouth daily with supper.     [provider]  Propylene Glycol (SYSTANE BALANCE) 0.6 % SOLN Place 1 drop into both eyes 3 (three) times daily.     [provider]  solifenacin (VESICARE) 5 MG tablet Take 5 mg by mouth daily after supper.     [provider]  tamsulosin (FLOMAX) 0.4 MG CAPS Take 0.4 mg by mouth daily after supper.     [provider]  vitamin C (ASCORBIC ACID) 500 MG tablet Take 500 mg by mouth daily.    [provider]    Family History Family History  Problem Relation Age of Onset  . Cirrhosis Father   . Heart Problems Mother        enlarge heart  .  Diabetes Brother   . Lung cancer Sister   . Alzheimer's disease Sister   . Heart disease Sister   . Lung cancer Brother     Social History Social History   Tobacco Use  . Smoking status: Former Smoker    Last attempt to quit: 12/22/1979    Years since quitting: 38.8  . Smokeless tobacco: Never Used  Substance Use Topics  . Alcohol use: Yes    Alcohol/week: 1.0 standard drinks    Types: 1 Glasses of wine per week    Comment: wine  . Drug use: No     Allergies   Neurontin [gabapentin] and Ramipril   Review of Systems Review of Systems  Constitutional: Negative for chills, fatigue and fever.  HENT: Negative for ear pain and sore throat.   Eyes: Negative for blurred vision, pain and visual disturbance.  Respiratory: Negative for cough and shortness of breath.   Cardiovascular: Negative for chest pain, palpitations and syncope.  Gastrointestinal: Negative for abdominal pain, nausea and vomiting.  Endocrine: Negative for polydipsia.  Genitourinary: Negative for dysuria and hematuria.  Musculoskeletal: Negative for arthralgias and back pain.  Skin: Negative for color change and rash.  Neurological: Negative for dizziness, seizures, syncope and weakness.  All other systems reviewed and are  negative.    Physical Exam Updated Vital Signs  ED Triage Vitals  Enc Vitals Group     BP 10/13/18 1945 (!) 191/82     Pulse Rate 10/13/18 1945 74     Resp 10/13/18 1945 (!) 23     Temp 10/13/18 1945 (!) 97.4 F (36.3 C)     Temp Source 10/13/18 1945 Oral     SpO2 10/13/18 1934 99 %     Weight 10/13/18 1937 205 lb 0.4 oz (93 kg)     Height 10/13/18 1937 5' 9.5" (1.765 m)     Head Circumference --      Peak Flow --      Pain Score 10/13/18 1937 0     Pain Loc --      Pain Edu? --      Excl. in [place]? --     Physical Exam Vitals signs and nursing note reviewed.  Constitutional:      General: He is not in acute distress.    Appearance: He is well-developed. He is not ill-appearing.  HENT:     Head: Normocephalic and atraumatic.     Nose: Nose normal.     Mouth/Throat:     Mouth: Mucous membranes are moist.  Eyes:     Extraocular Movements: Extraocular movements intact.     Conjunctiva/sclera: Conjunctivae normal.     Pupils: Pupils are equal, round, and reactive to light.  Neck:     Musculoskeletal: Normal range of motion and neck supple.  Cardiovascular:     Rate and Rhythm: Normal rate and regular rhythm.     Pulses: Normal pulses.     Heart sounds: Normal heart sounds. No murmur.  Pulmonary:     Effort: Pulmonary effort is normal. No respiratory distress.     Breath sounds: Normal breath sounds.  Abdominal:     General: There is no distension.     Palpations: Abdomen is soft.     Tenderness: There is no abdominal tenderness.  Musculoskeletal:     Right lower leg: No edema.     Left lower leg: No edema.  Skin:    General: Skin is warm and dry.  Neurological:  General: No focal deficit present.     Mental Status: He is alert.  Psychiatric:        Mood and Affect: Mood normal.      ED Treatments / Results  Labs (all labs ordered are listed, but only abnormal results are displayed) Labs Reviewed  CBC WITH DIFFERENTIAL/PLATELET - Abnormal; Notable for  the following components:      Result Value   WBC 13.2 (*)    RBC 3.34 (*)    Hemoglobin 10.6 (*)    HCT 32.5 (*)    Neutro Abs 11.3 (*)    Abs Immature Granulocytes 0.10 (*)    All other components within normal limits  COMPREHENSIVE METABOLIC PANEL - Abnormal; Notable for the following components:   Sodium 132 (*)    Chloride 97 (*)    Glucose, Bld 560 (*)    BUN 59 (*)    Creatinine, Ser 2.12 (*)    Calcium 8.7 (*)    Total Protein 5.8 (*)    Albumin 2.9 (*)    AST 106 (*)    ALT 378 (*)    Alkaline Phosphatase 144 (*)    GFR calc non Af Amer 26 (*)    GFR calc Af Amer 30 (*)    All other components within normal limits  URINALYSIS, ROUTINE W REFLEX MICROSCOPIC - Abnormal; Notable for the following components:   Glucose, UA >=500 (*)    Protein, ur 30 (*)    All other components within normal limits  CBG MONITORING, ED - Abnormal; Notable for the following components:   Glucose-Capillary 531 (*)    All other components within normal limits  POCT I-STAT EG7 - Abnormal; Notable for the following components:   pO2, Ven 21.0 (*)    HCT 31.0 (*)    Hemoglobin 10.5 (*)    All other components within normal limits  CBG MONITORING, ED - Abnormal; Notable for the following components:   Glucose-Capillary 342 (*)    All other components within normal limits  CBG MONITORING, ED - Abnormal; Notable for the following components:   Glucose-Capillary 347 (*)    All other components within normal limits  URINE CULTURE  BLOOD GAS, VENOUS    EKG None  Radiology Dg Chest 2 View  Result Date: 10/13/2018 CLINICAL DATA:  Bronchitis EXAM: CHEST - 2 VIEW COMPARISON:  September 30, 2018 FINDINGS: There is no appreciable edema or consolidation. The heart size is upper normal with pulmonary vascularity normal. There is aortic atherosclerosis. No adenopathy. There is mild degenerative change in the thoracic spine. There is mild epicardial fat prominence on the right. IMPRESSION: No edema or  consolidation. Heart upper normal in size. Aortic Atherosclerosis (ICD10-I70.0). Electronically Signed   By: Lowella Grip III M.D.   On: 10/13/2018 21:25    Procedures Procedures (including critical care time)  Medications Ordered in ED Medications  insulin aspart (novoLOG) injection 5 Units (5 Units Intravenous Given 10/13/18 2027)  sodium chloride 0.9 % bolus 1,000 mL (0 mLs Intravenous Stopped 10/13/18 2250)  insulin aspart (novoLOG) injection 5 Units (5 Units Intravenous Given 10/13/18 2312)     Initial Impression / Assessment and Plan / ED Course  I have reviewed the triage vital signs and the nursing notes.  Pertinent labs & imaging results that were available during my care of the patient were reviewed by me and considered in my medical decision making (see chart for details).     Kristopher Zimmerman is a 85 year old  male with history of TIA, diabetes, high cholesterol who presents to the ED with high blood sugar.  Patient currently on steroid taper for bronchitis.  Has about 2 to 3 days left of 1 single pill each day.  Patient normal vitals.  No fever.  Patient has been checking his blood sugar at home and was recently started on long-acting insulin given that his blood sugars have been elevated.  Today blood sugar was greater than 500 at home.  Patient took 10 units of extra long-acting insulin around 3 PM tonight.  Blood sugar still above 500.  Patient denies any other symptoms.  No chest pain no shortness of breath.  Appears mildly dehydrated.  Has mild transaminitis that is likely from dehydration. Normal bili and no abdominal tenderness on exam. Given normal saline bolus.  Given 5 units of short acting insulin after lab work showed no signs of diabetic ketoacidosis.  Blood sugar was 530.  Otherwise electrolytes are unremarkable.  Repeat sugar was improved to around 340.  After hydration sugar was checked again and was around 340 as well.  Patient was given additional 5 units of  NovoLog and sent home with further instructions to continue to check blood sugar at home.  Recommend stopping the prednisone at this time as taper is fairly completed at this point.  No concern for adrenal issues as he is only been on this medication for about 8 to 10 days.  He is not on chronic steroids.  Recommend following up with primary care doctor for repeat sugar, follow up about elevated liver enzymes as well. Discharged from ED in good condition.  Given return precautions.  This chart was dictated using voice recognition software.  Despite best efforts to proofread,  errors can occur which can change the documentation meaning.    Final Clinical Impressions(s) / ED Diagnoses   Final diagnoses:  Hyperglycemia    ED Discharge Orders    None       Lennice Sites, DO 10/13/18 2330

## 2018-10-13 NOTE — Discharge Instructions (Addendum)
Discontinue prednisone as discussed.  Continue to monitor blood sugar at home.  Return to ED if blood sugars greater than 500 with severe symptoms including fatigue, altered mental status.  Contact your primary care doctor as well.

## 2018-10-13 NOTE — ED Triage Notes (Signed)
Pt BIB [facility] for eval of hyperglycemia, per EMS home BGL read "HI", called EMS. Pt was recently dx'd w/ bronchitis, started on prednisone 2 weeks ago. Denies acute complaints, +urinary incontinence on arrival.

## 2018-10-14 DIAGNOSIS — E1165 Type 2 diabetes mellitus with hyperglycemia: Secondary | ICD-10-CM | POA: Diagnosis not present

## 2018-10-14 LAB — URINE CULTURE: Culture: >=10000 — AB

## 2018-10-21 DIAGNOSIS — I129 Hypertensive chronic kidney disease with stage 1 through stage 4 chronic kidney disease, or unspecified chronic kidney disease: Secondary | ICD-10-CM | POA: Diagnosis not present

## 2018-10-21 DIAGNOSIS — J440 Chronic obstructive pulmonary disease with acute lower respiratory infection: Secondary | ICD-10-CM | POA: Diagnosis not present

## 2018-10-21 DIAGNOSIS — E1140 Type 2 diabetes mellitus with diabetic neuropathy, unspecified: Secondary | ICD-10-CM | POA: Diagnosis not present

## 2018-10-21 DIAGNOSIS — J209 Acute bronchitis, unspecified: Secondary | ICD-10-CM | POA: Diagnosis not present

## 2018-10-21 DIAGNOSIS — J9601 Acute respiratory failure with hypoxia: Secondary | ICD-10-CM | POA: Diagnosis not present

## 2018-10-21 DIAGNOSIS — J441 Chronic obstructive pulmonary disease with (acute) exacerbation: Secondary | ICD-10-CM | POA: Diagnosis not present

## 2018-10-25 DIAGNOSIS — I129 Hypertensive chronic kidney disease with stage 1 through stage 4 chronic kidney disease, or unspecified chronic kidney disease: Secondary | ICD-10-CM | POA: Diagnosis not present

## 2018-10-25 DIAGNOSIS — J440 Chronic obstructive pulmonary disease with acute lower respiratory infection: Secondary | ICD-10-CM | POA: Diagnosis not present

## 2018-10-25 DIAGNOSIS — J9601 Acute respiratory failure with hypoxia: Secondary | ICD-10-CM | POA: Diagnosis not present

## 2018-10-25 DIAGNOSIS — E1140 Type 2 diabetes mellitus with diabetic neuropathy, unspecified: Secondary | ICD-10-CM | POA: Diagnosis not present

## 2018-10-25 DIAGNOSIS — J441 Chronic obstructive pulmonary disease with (acute) exacerbation: Secondary | ICD-10-CM | POA: Diagnosis not present

## 2018-10-25 DIAGNOSIS — J209 Acute bronchitis, unspecified: Secondary | ICD-10-CM | POA: Diagnosis not present

## 2018-10-28 DIAGNOSIS — J209 Acute bronchitis, unspecified: Secondary | ICD-10-CM | POA: Diagnosis not present

## 2018-10-28 DIAGNOSIS — J441 Chronic obstructive pulmonary disease with (acute) exacerbation: Secondary | ICD-10-CM | POA: Diagnosis not present

## 2018-10-28 DIAGNOSIS — E1140 Type 2 diabetes mellitus with diabetic neuropathy, unspecified: Secondary | ICD-10-CM | POA: Diagnosis not present

## 2018-10-28 DIAGNOSIS — J9601 Acute respiratory failure with hypoxia: Secondary | ICD-10-CM | POA: Diagnosis not present

## 2018-10-28 DIAGNOSIS — I129 Hypertensive chronic kidney disease with stage 1 through stage 4 chronic kidney disease, or unspecified chronic kidney disease: Secondary | ICD-10-CM | POA: Diagnosis not present

## 2018-10-28 DIAGNOSIS — J440 Chronic obstructive pulmonary disease with acute lower respiratory infection: Secondary | ICD-10-CM | POA: Diagnosis not present

## 2018-11-05 DIAGNOSIS — I129 Hypertensive chronic kidney disease with stage 1 through stage 4 chronic kidney disease, or unspecified chronic kidney disease: Secondary | ICD-10-CM | POA: Diagnosis not present

## 2018-11-05 DIAGNOSIS — E1165 Type 2 diabetes mellitus with hyperglycemia: Secondary | ICD-10-CM | POA: Diagnosis not present

## 2018-11-05 DIAGNOSIS — E782 Mixed hyperlipidemia: Secondary | ICD-10-CM | POA: Diagnosis not present

## 2018-11-13 ENCOUNTER — Encounter (INDEPENDENT_AMBULATORY_CARE_PROVIDER_SITE_OTHER): Payer: Medicare Other | Admitting: Ophthalmology

## 2018-11-13 ENCOUNTER — Other Ambulatory Visit: Payer: Self-pay

## 2018-11-13 DIAGNOSIS — I10 Essential (primary) hypertension: Secondary | ICD-10-CM | POA: Diagnosis not present

## 2018-11-13 DIAGNOSIS — H35033 Hypertensive retinopathy, bilateral: Secondary | ICD-10-CM | POA: Diagnosis not present

## 2018-11-13 DIAGNOSIS — H43813 Vitreous degeneration, bilateral: Secondary | ICD-10-CM

## 2018-11-13 DIAGNOSIS — H353231 Exudative age-related macular degeneration, bilateral, with active choroidal neovascularization: Secondary | ICD-10-CM

## 2018-12-05 DIAGNOSIS — R350 Frequency of micturition: Secondary | ICD-10-CM | POA: Diagnosis not present

## 2018-12-11 ENCOUNTER — Other Ambulatory Visit: Payer: Self-pay

## 2018-12-11 ENCOUNTER — Encounter (INDEPENDENT_AMBULATORY_CARE_PROVIDER_SITE_OTHER): Payer: Medicare Other | Admitting: Ophthalmology

## 2018-12-11 DIAGNOSIS — I10 Essential (primary) hypertension: Secondary | ICD-10-CM | POA: Diagnosis not present

## 2018-12-11 DIAGNOSIS — H35033 Hypertensive retinopathy, bilateral: Secondary | ICD-10-CM

## 2018-12-11 DIAGNOSIS — H353231 Exudative age-related macular degeneration, bilateral, with active choroidal neovascularization: Secondary | ICD-10-CM | POA: Diagnosis not present

## 2018-12-11 DIAGNOSIS — H43813 Vitreous degeneration, bilateral: Secondary | ICD-10-CM

## 2018-12-17 DIAGNOSIS — I129 Hypertensive chronic kidney disease with stage 1 through stage 4 chronic kidney disease, or unspecified chronic kidney disease: Secondary | ICD-10-CM | POA: Diagnosis not present

## 2018-12-17 DIAGNOSIS — E1165 Type 2 diabetes mellitus with hyperglycemia: Secondary | ICD-10-CM | POA: Diagnosis not present

## 2018-12-17 DIAGNOSIS — E782 Mixed hyperlipidemia: Secondary | ICD-10-CM | POA: Diagnosis not present

## 2018-12-17 DIAGNOSIS — E1121 Type 2 diabetes mellitus with diabetic nephropathy: Secondary | ICD-10-CM | POA: Diagnosis not present

## 2018-12-17 DIAGNOSIS — Z7189 Other specified counseling: Secondary | ICD-10-CM | POA: Diagnosis not present

## 2018-12-27 DIAGNOSIS — R350 Frequency of micturition: Secondary | ICD-10-CM | POA: Diagnosis not present

## 2018-12-30 DIAGNOSIS — H66002 Acute suppurative otitis media without spontaneous rupture of ear drum, left ear: Secondary | ICD-10-CM | POA: Diagnosis not present

## 2018-12-30 DIAGNOSIS — H6983 Other specified disorders of Eustachian tube, bilateral: Secondary | ICD-10-CM | POA: Diagnosis not present

## 2019-01-08 ENCOUNTER — Encounter (INDEPENDENT_AMBULATORY_CARE_PROVIDER_SITE_OTHER): Payer: Medicare Other | Admitting: Ophthalmology

## 2019-01-08 ENCOUNTER — Other Ambulatory Visit: Payer: Self-pay

## 2019-01-08 DIAGNOSIS — I10 Essential (primary) hypertension: Secondary | ICD-10-CM

## 2019-01-08 DIAGNOSIS — H43813 Vitreous degeneration, bilateral: Secondary | ICD-10-CM | POA: Diagnosis not present

## 2019-01-08 DIAGNOSIS — H35033 Hypertensive retinopathy, bilateral: Secondary | ICD-10-CM

## 2019-01-08 DIAGNOSIS — H353231 Exudative age-related macular degeneration, bilateral, with active choroidal neovascularization: Secondary | ICD-10-CM

## 2019-01-14 DIAGNOSIS — H66002 Acute suppurative otitis media without spontaneous rupture of ear drum, left ear: Secondary | ICD-10-CM | POA: Diagnosis not present

## 2019-01-14 DIAGNOSIS — H663X3 Other chronic suppurative otitis media, bilateral: Secondary | ICD-10-CM | POA: Diagnosis not present

## 2019-01-15 DIAGNOSIS — H66002 Acute suppurative otitis media without spontaneous rupture of ear drum, left ear: Secondary | ICD-10-CM | POA: Diagnosis not present

## 2019-01-16 DIAGNOSIS — R350 Frequency of micturition: Secondary | ICD-10-CM | POA: Diagnosis not present

## 2019-02-06 DIAGNOSIS — R350 Frequency of micturition: Secondary | ICD-10-CM | POA: Diagnosis not present

## 2019-02-11 DIAGNOSIS — E1165 Type 2 diabetes mellitus with hyperglycemia: Secondary | ICD-10-CM | POA: Diagnosis not present

## 2019-02-11 DIAGNOSIS — E782 Mixed hyperlipidemia: Secondary | ICD-10-CM | POA: Diagnosis not present

## 2019-02-11 DIAGNOSIS — I129 Hypertensive chronic kidney disease with stage 1 through stage 4 chronic kidney disease, or unspecified chronic kidney disease: Secondary | ICD-10-CM | POA: Diagnosis not present

## 2019-02-12 ENCOUNTER — Encounter (INDEPENDENT_AMBULATORY_CARE_PROVIDER_SITE_OTHER): Payer: Medicare Other | Admitting: Ophthalmology

## 2019-02-12 ENCOUNTER — Other Ambulatory Visit: Payer: Self-pay

## 2019-02-12 DIAGNOSIS — I10 Essential (primary) hypertension: Secondary | ICD-10-CM | POA: Diagnosis not present

## 2019-02-12 DIAGNOSIS — H35033 Hypertensive retinopathy, bilateral: Secondary | ICD-10-CM | POA: Diagnosis not present

## 2019-02-12 DIAGNOSIS — H353231 Exudative age-related macular degeneration, bilateral, with active choroidal neovascularization: Secondary | ICD-10-CM | POA: Diagnosis not present

## 2019-02-12 DIAGNOSIS — H43813 Vitreous degeneration, bilateral: Secondary | ICD-10-CM

## 2019-02-13 DIAGNOSIS — H663X3 Other chronic suppurative otitis media, bilateral: Secondary | ICD-10-CM | POA: Diagnosis not present

## 2019-02-18 DIAGNOSIS — E782 Mixed hyperlipidemia: Secondary | ICD-10-CM | POA: Diagnosis not present

## 2019-02-18 DIAGNOSIS — Z7189 Other specified counseling: Secondary | ICD-10-CM | POA: Diagnosis not present

## 2019-02-18 DIAGNOSIS — E1165 Type 2 diabetes mellitus with hyperglycemia: Secondary | ICD-10-CM | POA: Diagnosis not present

## 2019-02-18 DIAGNOSIS — I129 Hypertensive chronic kidney disease with stage 1 through stage 4 chronic kidney disease, or unspecified chronic kidney disease: Secondary | ICD-10-CM | POA: Diagnosis not present

## 2019-02-20 DIAGNOSIS — H6983 Other specified disorders of Eustachian tube, bilateral: Secondary | ICD-10-CM | POA: Diagnosis not present

## 2019-02-20 DIAGNOSIS — H9201 Otalgia, right ear: Secondary | ICD-10-CM | POA: Diagnosis not present

## 2019-02-27 DIAGNOSIS — R350 Frequency of micturition: Secondary | ICD-10-CM | POA: Diagnosis not present

## 2019-03-19 ENCOUNTER — Other Ambulatory Visit: Payer: Self-pay

## 2019-03-19 ENCOUNTER — Encounter (INDEPENDENT_AMBULATORY_CARE_PROVIDER_SITE_OTHER): Payer: Medicare Other | Admitting: Ophthalmology

## 2019-03-19 DIAGNOSIS — H35033 Hypertensive retinopathy, bilateral: Secondary | ICD-10-CM | POA: Diagnosis not present

## 2019-03-19 DIAGNOSIS — H353231 Exudative age-related macular degeneration, bilateral, with active choroidal neovascularization: Secondary | ICD-10-CM

## 2019-03-19 DIAGNOSIS — I10 Essential (primary) hypertension: Secondary | ICD-10-CM | POA: Diagnosis not present

## 2019-03-19 DIAGNOSIS — H43813 Vitreous degeneration, bilateral: Secondary | ICD-10-CM

## 2019-03-27 DIAGNOSIS — H40013 Open angle with borderline findings, low risk, bilateral: Secondary | ICD-10-CM | POA: Diagnosis not present

## 2019-03-27 DIAGNOSIS — Z961 Presence of intraocular lens: Secondary | ICD-10-CM | POA: Diagnosis not present

## 2019-03-27 DIAGNOSIS — E119 Type 2 diabetes mellitus without complications: Secondary | ICD-10-CM | POA: Diagnosis not present

## 2019-03-27 DIAGNOSIS — H53462 Homonymous bilateral field defects, left side: Secondary | ICD-10-CM | POA: Diagnosis not present

## 2019-04-03 DIAGNOSIS — Z23 Encounter for immunization: Secondary | ICD-10-CM | POA: Diagnosis not present

## 2019-04-17 NOTE — Progress Notes (Signed)
HPI: Follow-up aortic stenosis. Echocardiogram November 2015 showed normal LV function, mild aortic stenosis with a mean gradient of 10 mmHg, mild mitral regurgitation and mild biatrial enlargement. Nuclear study 2015 showed an ejection fraction of 44%. Perfusion was normal. Had CVA 12/16. Echocardiogram March 2020 showed normal LV function, mild left atrial enlargement and mild aortic stenosis.  Since he was last seen  patient has dyspnea with activities but no orthopnea, PND, pedal edema, exertional chest pain or syncope.  Current Outpatient Medications  Medication Sig Dispense Refill  . albuterol (PROVENTIL HFA;VENTOLIN HFA) 108 (90 Base) MCG/ACT inhaler Inhale 2 puffs into the lungs every 6 (six) hours as needed for wheezing or shortness of breath. 1 Inhaler 2  . amLODipine (NORVASC) 5 MG tablet Take 1 tablet (5 mg total) by mouth daily. 90 tablet 3  . B Complex-C (B-COMPLEX WITH VITAMIN C) tablet Take 1 tablet by mouth daily.    Marland Kitchen Besifloxacin HCl (BESIVANCE) 0.6 % SUSP Place 1 drop into both eyes See admin instructions. Instill one drop into both eyes three times on day of monthly eye injections and four times on the day after    . Calcium Carb-Cholecalciferol (CALCIUM 600-D PO) Take 1 tablet by mouth daily.    . Carboxymethylcellulose Sodium (REFRESH LIQUIGEL OP) Place 1 drop into both eyes 3 (three) times daily.     . Carboxymethylcellulose Sodium (REFRESH LIQUIGEL OP) Place 1 drop into both eyes 3 (three) times daily.    . clopidogrel (PLAVIX) 75 MG tablet Take 1 tablet (75 mg total) by mouth daily. 30 tablet 1  . cyclobenzaprine (FLEXERIL) 10 MG tablet Take 10 mg by mouth at bedtime.    Marland Kitchen Dextromethorphan-guaiFENesin (MUCINEX DM PO) Take 1 capsule by mouth every 12 (twelve) hours.    . DULoxetine (CYMBALTA) 30 MG capsule Take 30 mg by mouth See admin instructions. Take one capsule (30 mg) by mouth with a 60 mg capsule for a total dose of 90 mg - daily at bedtime    . DULoxetine  (CYMBALTA) 60 MG capsule Take 60 mg by mouth See admin instructions. Take one capsule (60 mg) by mouth with a 30 mg capsule for a total dose of 90 mg - daily at bedtime    . glyBURIDE (DIABETA) 5 MG tablet Take 5 mg by mouth 2 (two) times daily.     Marland Kitchen guaiFENesin (DIABETIC TUSSIN PO) Take 1 Dose by mouth every 12 (twelve) hours.    Marland Kitchen HUMALOG KWIKPEN 100 UNIT/ML KwikPen Inject 100 Units as directed as needed. PRN if greater than 300    . HYDROcodone-acetaminophen (NORCO/VICODIN) 5-325 MG tablet Take 1 tablet by mouth at bedtime.   0  . insulin degludec (TRESIBA FLEXTOUCH) 100 UNIT/ML SOPN FlexTouch Pen Take 16 Units by mouth daily.    Marland Kitchen linagliptin (TRADJENTA) 5 MG TABS tablet Take 5 mg by mouth daily with supper.     Marland Kitchen LORazepam (ATIVAN) 2 MG tablet Take 4 mg by mouth at bedtime.     Marland Kitchen MAGNESIUM CITRATE PO Take 1 tablet by mouth daily.     . metoprolol tartrate (LOPRESSOR) 25 MG tablet Take 0.5 tablets (12.5 mg total) by mouth 2 (two) times daily. 60 tablet 1  . Multiple Vitamins-Minerals (PRESERVISION AREDS 2) CAPS Take 1 capsule by mouth 2 (two) times daily.     . polyethylene glycol (MIRALAX / GLYCOLAX) packet Take 17 g by mouth daily. Mix in 8 oz fluid and drink    .  pramipexole (MIRAPEX) 0.5 MG tablet Take 0.5 mg by mouth at bedtime.     . pravastatin (PRAVACHOL) 40 MG tablet Take 40 mg by mouth daily with supper.     . Propylene Glycol (SYSTANE BALANCE) 0.6 % SOLN Place 1 drop into both eyes 3 (three) times daily.     . solifenacin (VESICARE) 5 MG tablet Take 5 mg by mouth daily after supper.     . tamsulosin (FLOMAX) 0.4 MG CAPS Take 0.4 mg by mouth daily after supper.     . vitamin C (ASCORBIC ACID) 500 MG tablet Take 500 mg by mouth daily.     No current facility-administered medications for this visit.      Past Medical History:  Diagnosis Date  . Diabetes mellitus without complication ([place])   . Hyperlipidemia   . Hypertension   . Macular degeneration of right eye   .  Neuropathy   . Neuropathy, diabetic ([place])   . Renal insufficiency   . Stroke [facility]) 11-28-2014   Also 07-25-2015  . TIA (transient ischemic attack) 05/06/2016    Past Surgical History:  Procedure Laterality Date  . APPENDECTOMY    . BACK SURGERY    . Broken leg    . CERVICAL DISC SURGERY    . TONSILLECTOMY      Social History   Socioeconomic History  . Marital status: Married    Spouse name: Vicky  . Number of children: 3  . Years of education: Not on file  . Highest education level: Not on file  Occupational History  . Not on file  Social Needs  . Financial resource strain: Not on file  . Food insecurity    Worry: Not on file    Inability: Not on file  . Transportation needs    Medical: Not on file    Non-medical: Not on file  Tobacco Use  . Smoking status: Former Smoker    Quit date: 12/22/1979    Years since quitting: 39.3  . Smokeless tobacco: Never Used  Substance and Sexual Activity  . Alcohol use: Yes    Alcohol/week: 1.0 standard drinks    Types: 1 Glasses of wine per week    Comment: wine  . Drug use: No  . Sexual activity: Not on file  Lifestyle  . Physical activity    Days per week: Not on file    Minutes per session: Not on file  . Stress: Not on file  Relationships  . Social Herbalist on phone: Not on file    Gets together: Not on file    Attends religious service: Not on file    Active member of club or organization: Not on file    Attends meetings of clubs or organizations: Not on file    Relationship status: Not on file  . Intimate partner violence    Fear of current or ex partner: Not on file    Emotionally abused: Not on file    Physically abused: Not on file    Forced sexual activity: Not on file  Other Topics Concern  . Not on file  Social History Narrative   Lives at home with wife.  3 grown kids.  Retired.  Caffeine none.      Family History  Problem Relation Age of Onset  . Cirrhosis Father   . Heart Problems  Mother        enlarge heart  . Diabetes Brother   . Lung cancer Sister   .  Alzheimer's disease Sister   . Heart disease Sister   . Lung cancer Brother     ROS: no fevers or chills, productive cough, hemoptysis, dysphasia, odynophagia, melena, hematochezia, dysuria, hematuria, rash, seizure activity, orthopnea, PND, pedal edema, claudication. Remaining systems are negative.  Physical Exam: Well-developed well-nourished in no acute distress.  Skin is warm and dry.  HEENT is normal.  Neck is supple.  Chest is clear to auscultation with normal expansion.  Cardiovascular exam is regular rate and rhythm.  2/6 systolic murmur left sternal border.  S2 is not diminished. Abdominal exam nontender or distended. No masses palpated. Extremities show no edema. neuro grossly intact   A/P  1 aortic stenosis-as outlined in previous notes patient states he would never be willing to pursue procedures including aortic valve replacement in the future.  We will therefore not plan to do follow-up echocardiograms.  2 hypertension-patient's blood pressure is controlled.  Continue present medications and follow.  3 hyperlipidemia-continue statin.  4 history of chest pain-patient has had episodes of chest pain previously but he does not want aggressive evaluation and is a no CODE BLUE.  Kirk Ruths, MD

## 2019-04-21 ENCOUNTER — Encounter: Payer: Self-pay | Admitting: Cardiology

## 2019-04-21 ENCOUNTER — Other Ambulatory Visit: Payer: Self-pay

## 2019-04-21 ENCOUNTER — Ambulatory Visit (INDEPENDENT_AMBULATORY_CARE_PROVIDER_SITE_OTHER): Payer: Medicare Other | Admitting: Cardiology

## 2019-04-21 VITALS — BP 116/60 | HR 88 | Ht 69.5 in | Wt 206.2 lb

## 2019-04-21 DIAGNOSIS — I10 Essential (primary) hypertension: Secondary | ICD-10-CM

## 2019-04-21 DIAGNOSIS — R072 Precordial pain: Secondary | ICD-10-CM

## 2019-04-21 DIAGNOSIS — I350 Nonrheumatic aortic (valve) stenosis: Secondary | ICD-10-CM

## 2019-04-21 NOTE — Patient Instructions (Signed)

## 2019-04-23 ENCOUNTER — Encounter (INDEPENDENT_AMBULATORY_CARE_PROVIDER_SITE_OTHER): Payer: Medicare Other | Admitting: Ophthalmology

## 2019-04-23 ENCOUNTER — Other Ambulatory Visit: Payer: Self-pay

## 2019-04-23 DIAGNOSIS — H43813 Vitreous degeneration, bilateral: Secondary | ICD-10-CM | POA: Diagnosis not present

## 2019-04-23 DIAGNOSIS — H353231 Exudative age-related macular degeneration, bilateral, with active choroidal neovascularization: Secondary | ICD-10-CM

## 2019-04-23 DIAGNOSIS — H35033 Hypertensive retinopathy, bilateral: Secondary | ICD-10-CM | POA: Diagnosis not present

## 2019-04-23 DIAGNOSIS — I10 Essential (primary) hypertension: Secondary | ICD-10-CM

## 2019-04-28 DIAGNOSIS — R0789 Other chest pain: Secondary | ICD-10-CM | POA: Diagnosis not present

## 2019-04-28 DIAGNOSIS — E1165 Type 2 diabetes mellitus with hyperglycemia: Secondary | ICD-10-CM | POA: Diagnosis not present

## 2019-04-28 DIAGNOSIS — S4992XA Unspecified injury of left shoulder and upper arm, initial encounter: Secondary | ICD-10-CM | POA: Diagnosis not present

## 2019-04-28 DIAGNOSIS — S299XXA Unspecified injury of thorax, initial encounter: Secondary | ICD-10-CM | POA: Diagnosis not present

## 2019-04-28 DIAGNOSIS — M25512 Pain in left shoulder: Secondary | ICD-10-CM | POA: Diagnosis not present

## 2019-04-28 DIAGNOSIS — I129 Hypertensive chronic kidney disease with stage 1 through stage 4 chronic kidney disease, or unspecified chronic kidney disease: Secondary | ICD-10-CM | POA: Diagnosis not present

## 2019-04-28 DIAGNOSIS — W1809XA Striking against other object with subsequent fall, initial encounter: Secondary | ICD-10-CM | POA: Diagnosis not present

## 2019-05-06 DIAGNOSIS — I129 Hypertensive chronic kidney disease with stage 1 through stage 4 chronic kidney disease, or unspecified chronic kidney disease: Secondary | ICD-10-CM | POA: Diagnosis not present

## 2019-05-06 DIAGNOSIS — E1165 Type 2 diabetes mellitus with hyperglycemia: Secondary | ICD-10-CM | POA: Diagnosis not present

## 2019-05-06 DIAGNOSIS — E782 Mixed hyperlipidemia: Secondary | ICD-10-CM | POA: Diagnosis not present

## 2019-05-13 DIAGNOSIS — I129 Hypertensive chronic kidney disease with stage 1 through stage 4 chronic kidney disease, or unspecified chronic kidney disease: Secondary | ICD-10-CM | POA: Diagnosis not present

## 2019-05-13 DIAGNOSIS — E782 Mixed hyperlipidemia: Secondary | ICD-10-CM | POA: Diagnosis not present

## 2019-05-13 DIAGNOSIS — E1165 Type 2 diabetes mellitus with hyperglycemia: Secondary | ICD-10-CM | POA: Diagnosis not present

## 2019-05-13 DIAGNOSIS — E10319 Type 1 diabetes mellitus with unspecified diabetic retinopathy without macular edema: Secondary | ICD-10-CM | POA: Diagnosis not present

## 2019-05-13 DIAGNOSIS — Z7189 Other specified counseling: Secondary | ICD-10-CM | POA: Diagnosis not present

## 2019-05-28 ENCOUNTER — Encounter (INDEPENDENT_AMBULATORY_CARE_PROVIDER_SITE_OTHER): Payer: Medicare Other | Admitting: Ophthalmology

## 2019-05-28 DIAGNOSIS — H35033 Hypertensive retinopathy, bilateral: Secondary | ICD-10-CM | POA: Diagnosis not present

## 2019-05-28 DIAGNOSIS — H353231 Exudative age-related macular degeneration, bilateral, with active choroidal neovascularization: Secondary | ICD-10-CM | POA: Diagnosis not present

## 2019-05-28 DIAGNOSIS — I10 Essential (primary) hypertension: Secondary | ICD-10-CM | POA: Diagnosis not present

## 2019-05-28 DIAGNOSIS — H43813 Vitreous degeneration, bilateral: Secondary | ICD-10-CM | POA: Diagnosis not present

## 2019-06-18 DIAGNOSIS — H66002 Acute suppurative otitis media without spontaneous rupture of ear drum, left ear: Secondary | ICD-10-CM | POA: Diagnosis not present

## 2019-06-18 DIAGNOSIS — H663X3 Other chronic suppurative otitis media, bilateral: Secondary | ICD-10-CM | POA: Diagnosis not present

## 2019-07-02 ENCOUNTER — Encounter (INDEPENDENT_AMBULATORY_CARE_PROVIDER_SITE_OTHER): Payer: Medicare Other | Admitting: Ophthalmology

## 2019-07-02 DIAGNOSIS — H43813 Vitreous degeneration, bilateral: Secondary | ICD-10-CM

## 2019-07-02 DIAGNOSIS — I10 Essential (primary) hypertension: Secondary | ICD-10-CM

## 2019-07-02 DIAGNOSIS — H353231 Exudative age-related macular degeneration, bilateral, with active choroidal neovascularization: Secondary | ICD-10-CM | POA: Diagnosis not present

## 2019-07-02 DIAGNOSIS — H35033 Hypertensive retinopathy, bilateral: Secondary | ICD-10-CM | POA: Diagnosis not present

## 2019-08-06 ENCOUNTER — Encounter (INDEPENDENT_AMBULATORY_CARE_PROVIDER_SITE_OTHER): Payer: Medicare Other | Admitting: Ophthalmology

## 2019-08-06 DIAGNOSIS — H353231 Exudative age-related macular degeneration, bilateral, with active choroidal neovascularization: Secondary | ICD-10-CM

## 2019-08-06 DIAGNOSIS — H35033 Hypertensive retinopathy, bilateral: Secondary | ICD-10-CM | POA: Diagnosis not present

## 2019-08-06 DIAGNOSIS — I10 Essential (primary) hypertension: Secondary | ICD-10-CM

## 2019-08-06 DIAGNOSIS — H43813 Vitreous degeneration, bilateral: Secondary | ICD-10-CM | POA: Diagnosis not present

## 2019-08-08 DIAGNOSIS — H663X3 Other chronic suppurative otitis media, bilateral: Secondary | ICD-10-CM | POA: Diagnosis not present

## 2019-08-20 ENCOUNTER — Ambulatory Visit: Payer: Medicare Other | Attending: Internal Medicine

## 2019-08-20 DIAGNOSIS — Z23 Encounter for immunization: Secondary | ICD-10-CM | POA: Diagnosis not present

## 2019-08-20 NOTE — Progress Notes (Signed)
   Covid-19 Vaccination Clinic  Name:  Kristopher Zimmerman    MRN: [id number] DOB: [birth year]  08/20/2019  Kristopher Zimmerman was observed post Covid-19 immunization for 15 minutes without incidence. He was provided with Vaccine Information Sheet and instruction to access the V-Safe system.   Kristopher Zimmerman was instructed to call 911 with any severe reactions post vaccine: Marland Kitchen Difficulty breathing  . Swelling of your face and throat  . A fast heartbeat  . A bad rash all over your body  . Dizziness and weakness    Immunizations Administered    Name Date Dose VIS Date Route   Pfizer COVID-19 Vaccine 08/20/2019  2:34 PM 0.3 mL 07/11/2019 Intramuscular   Manufacturer: [place]   Lot: [id number]   [place]: [id number]

## 2019-09-10 ENCOUNTER — Encounter (INDEPENDENT_AMBULATORY_CARE_PROVIDER_SITE_OTHER): Payer: Medicare Other | Admitting: Ophthalmology

## 2019-09-10 ENCOUNTER — Other Ambulatory Visit: Payer: Self-pay

## 2019-09-10 DIAGNOSIS — H353231 Exudative age-related macular degeneration, bilateral, with active choroidal neovascularization: Secondary | ICD-10-CM

## 2019-09-10 DIAGNOSIS — H35033 Hypertensive retinopathy, bilateral: Secondary | ICD-10-CM | POA: Diagnosis not present

## 2019-09-10 DIAGNOSIS — H43813 Vitreous degeneration, bilateral: Secondary | ICD-10-CM | POA: Diagnosis not present

## 2019-09-10 DIAGNOSIS — I10 Essential (primary) hypertension: Secondary | ICD-10-CM | POA: Diagnosis not present

## 2019-09-11 ENCOUNTER — Ambulatory Visit: Payer: Medicare Other | Attending: Internal Medicine

## 2019-09-11 DIAGNOSIS — Z23 Encounter for immunization: Secondary | ICD-10-CM | POA: Insufficient documentation

## 2019-09-11 NOTE — Progress Notes (Signed)
   Covid-19 Vaccination Clinic  Name:  Kristopher Zimmerman    MRN: [id number] DOB: 24-Sep-1923  09/11/2019  Kristopher Zimmerman was observed post Covid-19 immunization for 15 minutes without incidence. He was provided with Vaccine Information Sheet and instruction to access the V-Safe system.   Kristopher Zimmerman was instructed to call 911 with any severe reactions post vaccine: Marland Kitchen Difficulty breathing  . Swelling of your face and throat  . A fast heartbeat  . A bad rash all over your body  . Dizziness and weakness    Immunizations Administered    Name Date Dose VIS Date Route   Pfizer COVID-19 Vaccine 09/11/2019  1:28 PM 0.3 mL 07/11/2019 Intramuscular   Manufacturer: [place]   Lot: [id number]   [place]: [id number]

## 2019-10-08 ENCOUNTER — Encounter (INDEPENDENT_AMBULATORY_CARE_PROVIDER_SITE_OTHER): Payer: Medicare Other | Admitting: Ophthalmology

## 2019-10-08 DIAGNOSIS — H43813 Vitreous degeneration, bilateral: Secondary | ICD-10-CM | POA: Diagnosis not present

## 2019-10-08 DIAGNOSIS — I10 Essential (primary) hypertension: Secondary | ICD-10-CM

## 2019-10-08 DIAGNOSIS — H353231 Exudative age-related macular degeneration, bilateral, with active choroidal neovascularization: Secondary | ICD-10-CM | POA: Diagnosis not present

## 2019-10-08 DIAGNOSIS — H35033 Hypertensive retinopathy, bilateral: Secondary | ICD-10-CM

## 2019-10-13 DIAGNOSIS — E1165 Type 2 diabetes mellitus with hyperglycemia: Secondary | ICD-10-CM | POA: Diagnosis not present

## 2019-10-13 DIAGNOSIS — E782 Mixed hyperlipidemia: Secondary | ICD-10-CM | POA: Diagnosis not present

## 2019-10-13 DIAGNOSIS — I10 Essential (primary) hypertension: Secondary | ICD-10-CM | POA: Diagnosis not present

## 2019-10-13 DIAGNOSIS — I129 Hypertensive chronic kidney disease with stage 1 through stage 4 chronic kidney disease, or unspecified chronic kidney disease: Secondary | ICD-10-CM | POA: Diagnosis not present

## 2019-10-13 DIAGNOSIS — E538 Deficiency of other specified B group vitamins: Secondary | ICD-10-CM | POA: Diagnosis not present

## 2019-10-20 DIAGNOSIS — E10319 Type 1 diabetes mellitus with unspecified diabetic retinopathy without macular edema: Secondary | ICD-10-CM | POA: Diagnosis not present

## 2019-10-20 DIAGNOSIS — I700 Atherosclerosis of aorta: Secondary | ICD-10-CM | POA: Diagnosis not present

## 2019-10-20 DIAGNOSIS — I129 Hypertensive chronic kidney disease with stage 1 through stage 4 chronic kidney disease, or unspecified chronic kidney disease: Secondary | ICD-10-CM | POA: Diagnosis not present

## 2019-10-20 DIAGNOSIS — G603 Idiopathic progressive neuropathy: Secondary | ICD-10-CM | POA: Diagnosis not present

## 2019-10-20 DIAGNOSIS — E782 Mixed hyperlipidemia: Secondary | ICD-10-CM | POA: Diagnosis not present

## 2019-10-20 DIAGNOSIS — E1121 Type 2 diabetes mellitus with diabetic nephropathy: Secondary | ICD-10-CM | POA: Diagnosis not present

## 2019-10-20 DIAGNOSIS — E1165 Type 2 diabetes mellitus with hyperglycemia: Secondary | ICD-10-CM | POA: Diagnosis not present

## 2019-10-20 DIAGNOSIS — E1142 Type 2 diabetes mellitus with diabetic polyneuropathy: Secondary | ICD-10-CM | POA: Diagnosis not present

## 2019-10-20 DIAGNOSIS — Z0000 Encounter for general adult medical examination without abnormal findings: Secondary | ICD-10-CM | POA: Diagnosis not present

## 2019-11-05 ENCOUNTER — Encounter (INDEPENDENT_AMBULATORY_CARE_PROVIDER_SITE_OTHER): Payer: Medicare Other | Admitting: Ophthalmology

## 2019-11-05 ENCOUNTER — Other Ambulatory Visit: Payer: Self-pay

## 2019-11-05 DIAGNOSIS — H353231 Exudative age-related macular degeneration, bilateral, with active choroidal neovascularization: Secondary | ICD-10-CM | POA: Diagnosis not present

## 2019-11-05 DIAGNOSIS — H43813 Vitreous degeneration, bilateral: Secondary | ICD-10-CM

## 2019-11-05 DIAGNOSIS — I10 Essential (primary) hypertension: Secondary | ICD-10-CM | POA: Diagnosis not present

## 2019-11-05 DIAGNOSIS — H35033 Hypertensive retinopathy, bilateral: Secondary | ICD-10-CM | POA: Diagnosis not present

## 2019-11-06 DIAGNOSIS — R410 Disorientation, unspecified: Secondary | ICD-10-CM | POA: Diagnosis not present

## 2019-11-10 DIAGNOSIS — R441 Visual hallucinations: Secondary | ICD-10-CM | POA: Diagnosis not present

## 2019-11-10 DIAGNOSIS — E1165 Type 2 diabetes mellitus with hyperglycemia: Secondary | ICD-10-CM | POA: Diagnosis not present

## 2019-11-10 DIAGNOSIS — I63132 Cerebral infarction due to embolism of left carotid artery: Secondary | ICD-10-CM | POA: Diagnosis not present

## 2019-11-10 DIAGNOSIS — F0390 Unspecified dementia without behavioral disturbance: Secondary | ICD-10-CM | POA: Diagnosis not present

## 2019-11-13 ENCOUNTER — Other Ambulatory Visit: Payer: Self-pay | Admitting: Internal Medicine

## 2019-11-13 DIAGNOSIS — F0390 Unspecified dementia without behavioral disturbance: Secondary | ICD-10-CM

## 2019-11-14 ENCOUNTER — Other Ambulatory Visit: Payer: Self-pay | Admitting: Internal Medicine

## 2019-11-14 DIAGNOSIS — I63132 Cerebral infarction due to embolism of left carotid artery: Secondary | ICD-10-CM

## 2019-11-14 DIAGNOSIS — R441 Visual hallucinations: Secondary | ICD-10-CM

## 2019-11-14 DIAGNOSIS — F0390 Unspecified dementia without behavioral disturbance: Secondary | ICD-10-CM

## 2019-11-28 ENCOUNTER — Ambulatory Visit
Admission: RE | Admit: 2019-11-28 | Discharge: 2019-11-28 | Disposition: A | Discharge status: Home / Self Care | Payer: Medicare Other | Source: Ambulatory Visit | Attending: Internal Medicine | Admitting: Internal Medicine

## 2019-11-28 DIAGNOSIS — I63132 Cerebral infarction due to embolism of left carotid artery: Secondary | ICD-10-CM

## 2019-11-28 DIAGNOSIS — R42 Dizziness and giddiness: Secondary | ICD-10-CM | POA: Diagnosis not present

## 2019-11-28 DIAGNOSIS — F0390 Unspecified dementia without behavioral disturbance: Secondary | ICD-10-CM

## 2019-11-28 DIAGNOSIS — S0990XA Unspecified injury of head, initial encounter: Secondary | ICD-10-CM | POA: Diagnosis not present

## 2019-11-28 DIAGNOSIS — R441 Visual hallucinations: Secondary | ICD-10-CM

## 2019-12-01 DIAGNOSIS — F0390 Unspecified dementia without behavioral disturbance: Secondary | ICD-10-CM | POA: Diagnosis not present

## 2019-12-01 DIAGNOSIS — R441 Visual hallucinations: Secondary | ICD-10-CM | POA: Diagnosis not present

## 2019-12-03 ENCOUNTER — Encounter (INDEPENDENT_AMBULATORY_CARE_PROVIDER_SITE_OTHER): Payer: Medicare Other | Admitting: Ophthalmology

## 2019-12-03 DIAGNOSIS — H43813 Vitreous degeneration, bilateral: Secondary | ICD-10-CM | POA: Diagnosis not present

## 2019-12-03 DIAGNOSIS — I10 Essential (primary) hypertension: Secondary | ICD-10-CM

## 2019-12-03 DIAGNOSIS — H353231 Exudative age-related macular degeneration, bilateral, with active choroidal neovascularization: Secondary | ICD-10-CM

## 2019-12-03 DIAGNOSIS — H35033 Hypertensive retinopathy, bilateral: Secondary | ICD-10-CM | POA: Diagnosis not present

## 2019-12-16 DIAGNOSIS — I700 Atherosclerosis of aorta: Secondary | ICD-10-CM | POA: Diagnosis not present

## 2019-12-16 DIAGNOSIS — R350 Frequency of micturition: Secondary | ICD-10-CM | POA: Diagnosis not present

## 2019-12-16 DIAGNOSIS — I129 Hypertensive chronic kidney disease with stage 1 through stage 4 chronic kidney disease, or unspecified chronic kidney disease: Secondary | ICD-10-CM | POA: Diagnosis not present

## 2019-12-16 DIAGNOSIS — I10 Essential (primary) hypertension: Secondary | ICD-10-CM | POA: Diagnosis not present

## 2020-01-01 ENCOUNTER — Encounter: Payer: Self-pay | Admitting: Neurology

## 2020-01-01 ENCOUNTER — Ambulatory Visit (INDEPENDENT_AMBULATORY_CARE_PROVIDER_SITE_OTHER): Payer: Medicare Other | Admitting: Neurology

## 2020-01-01 ENCOUNTER — Other Ambulatory Visit: Payer: Self-pay

## 2020-01-01 VITALS — BP 168/68 | HR 57 | Ht 70.0 in | Wt 206.8 lb

## 2020-01-01 DIAGNOSIS — I63132 Cerebral infarction due to embolism of left carotid artery: Secondary | ICD-10-CM | POA: Diagnosis not present

## 2020-01-01 DIAGNOSIS — F03918 Unspecified dementia, unspecified severity, with other behavioral disturbance: Secondary | ICD-10-CM

## 2020-01-01 DIAGNOSIS — F0391 Unspecified dementia with behavioral disturbance: Secondary | ICD-10-CM | POA: Diagnosis not present

## 2020-01-01 DIAGNOSIS — Z8673 Personal history of transient ischemic attack (TIA), and cerebral infarction without residual deficits: Secondary | ICD-10-CM

## 2020-01-01 NOTE — Patient Instructions (Signed)
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  He was encouraged to increase participation in cognitively challenging activities like solving crossword puzzles, playing bridge and sodoku.  We also discussed memory compensation strategies.  Check dementia panel labs and EEG study.  Continue Plavix for stroke prevention with aggressive risk factor modification with strict control of hypertension with blood pressure goal below 130/90, lipids with LDL cholesterol goal below 70 mg percent and diabetes with hemoglobin A1c goal below 6.5%.  He was encouraged to use a walker at all times and we discussed fall safety precautions.  He will return for follow-up in the future in 3 months with my nurse practitioner Janett Billow or call earlier if necessary. Memory Compensation Strategies  1. Use "WARM" strategy.  W= write it down  A= associate it  R= repeat it  M= make a mental note  2.   You can keep a Social worker.  Use a 3-ring notebook with sections for the following: calendar, important names and phone numbers,  medications, doctors' names/phone numbers, lists/reminders, and a section to journal what you did  each day.   3.    Use a calendar to write appointments down.  4.    Write yourself a schedule for the day.  This can be placed on the calendar or in a separate section of the Memory Notebook.  Keeping a  regular schedule can help memory.  5.    Use medication organizer with sections for each day or morning/evening pills.  You may need help loading it  6.    Keep a basket, or pegboard by the door.  Place items that you need to take out with you in the basket or on the pegboard.  You may also want to  include a message  board for reminders.  7.    Use sticky notes.  Place sticky notes with reminders in a place where the task is performed.  For example: " turn off the  stove" placed by the stove, "lock the door" placed on the door at eye level, " take your medications" on  the bathroom mirror or by the place where you normally take your medications.  8.    Use alarms/timers.  Use while cooking to remind yourself to check on food or as a reminder to take your medicine, or as a  reminder to make a call, or as a reminder to perform another task, etc.

## 2020-01-01 NOTE — Progress Notes (Signed)
[facility] [address] [place]. [place] [address] [phone]       OFFICE CONSULT NOTE  [name] Date of Birth:  June 06, 1924 Medical Record Number:  [id number]   Referring MD: Merrilee Seashore  Reason for Referral: Dementia  HPI: Kristopher Zimmerman is a 86 year old pleasant Caucasian male seen today for initial office consultation visit for memory loss and dementia.  He is accompanied by his wife.  History is obtained from them and review of referral notes as well as electronic medical records and available imaging films in PACS.  He has had mild memory difficulties for the last several years but over the last couple of months they seem to be getting worse.  He has had increasing visual hallucinations and sees people's and in fact at times even has conversations with them.  He does not hear voices.  He does not get agitated or upset.  He is also had increasing short-term memory difficulties and does get confused sometimes.  He had a CT scan of the head done on 11/28/2019 which I personally reviewed shows old remote age right pontine infarct and mild generalized cerebral atrophy.  Previous MRI on 07/25/2015 had shown an acute right pontine infarct.  EEG on 10/25/2016 showed mild generalized slowing.  Patient was started on Aricept 5 mg daily 2 weeks ago as well as Namenda 5 mg twice daily 2 months ago and now the dose has been increased to 10 mg twice daily a month ago.  The wife has noticed slight improvement but in the last few weeks she is concerned that he is getting worse particularly his hallucinations and behavior.  Patient has a family history of Alzheimer's in his sister.  Patient does ambulate quite well but uses a walker.  He has had very rare occasional falls.  He needs mild help only with activities of daily living like meals but otherwise fairly independent.  He can be left alone for hours.  He does not wander off.-No unsafe behaviors noted.  Does not get  agitations delusions.  He has prior history of 2 strokes and has residual mild gait and balance difficulties for which he needs a walker.  He had a left basal ganglia subcortical infarct in April 2016 as well as right pontine infarct in December 2016 and has vascular risk factors of diabetes hyperlipidemia and age and cerebrovascular disease.  He is currently on Plavix which is tolerating well with minor bruising.  His blood pressure seems under good control.  Though it is elevated today at 168/68.  His diabetes and cholesterol control has been satisfactory.  He does have diabetic neuropathy and paresthesias in his feet and slight gait imbalance.  ROS:   14 system review of systems is positive for memory loss, confusion, gait imbalance, occasional falls, bruising and all other systems negative  PMH:  Past Medical History:  Diagnosis Date  . Diabetes mellitus without complication ([place])   . Hyperlipidemia   . Hypertension   . Macular degeneration of right eye   . Neuropathy   . Neuropathy, diabetic ([place])   . Renal insufficiency   . Stroke [facility]) 11-28-2014   Also 07-25-2015  . TIA (transient ischemic attack) 05/06/2016    Social History:  Social History   Socioeconomic History  . Marital status: Married    Spouse name: Vicky  . Number of children: 3  . Years of education: Not on file  . Highest education level: Not on file  Occupational History  .  Not on file  Tobacco Use  . Smoking status: Former Smoker    Quit date: 12/22/1979    Years since quitting: 40.0  . Smokeless tobacco: Never Used  Substance and Sexual Activity  . Alcohol use: Yes    Alcohol/week: 1.0 standard drinks    Types: 1 Glasses of wine per week    Comment: wine  . Drug use: No  . Sexual activity: Not on file  Other Topics Concern  . Not on file  Social History Narrative   Lives at home with wife.  3 grown kids.  Retired.  Caffeine none.     Social Determinants of Health   Financial Resource Strain:   .  Difficulty of Paying Living Expenses:   Food Insecurity:   . Worried About Charity fundraiser in the Last Year:   . Arboriculturist in the Last Year:   Transportation Needs:   . Film/video editor (Medical):   Marland Kitchen Lack of Transportation (Non-Medical):   Physical Activity:   . Days of Exercise per Week:   . Minutes of Exercise per Session:   Stress:   . Feeling of Stress :   Social Connections:   . Frequency of Communication with Friends and Family:   . Frequency of Social Gatherings with Friends and Family:   . Attends Religious Services:   . Active Member of Clubs or Organizations:   . Attends Archivist Meetings:   Marland Kitchen Marital Status:   Intimate Partner Violence:   . Fear of Current or Ex-Partner:   . Emotionally Abused:   Marland Kitchen Physically Abused:   . Sexually Abused:     Medications:   Current Outpatient Medications on File Prior to Visit  Medication Sig Dispense Refill  . amLODipine (NORVASC) 5 MG tablet Take 1 tablet (5 mg total) by mouth daily. 90 tablet 3  . ascorbic acid (VITAMIN C) 1000 MG tablet Take by mouth.    . B Complex-C (B-COMPLEX WITH VITAMIN C) tablet Take 1 tablet by mouth daily.    Marland Kitchen Besifloxacin HCl (BESIVANCE) 0.6 % SUSP Place 1 drop into both eyes See admin instructions. Instill one drop into both eyes three times on day of monthly eye injections and four times on the day after    . Calcium Carb-Cholecalciferol (CALCIUM 600-D PO) Take 1 tablet by mouth daily.    . calcium-vitamin D (OSCAL WITH D) 500-200 MG-UNIT TABS tablet Take by mouth.    . carboxymethylcellulose (REFRESH PLUS) 0.5 % SOLN 1 drop 3 (three) times daily as needed.    . clopidogrel (PLAVIX) 75 MG tablet Take 1 tablet (75 mg total) by mouth daily. 30 tablet 1  . cyclobenzaprine (FLEXERIL) 10 MG tablet Take 10 mg by mouth at bedtime.    Marland Kitchen Dextromethorphan-guaiFENesin (MUCINEX DM PO) Take 1 capsule by mouth every 12 (twelve) hours.    Marland Kitchen donepezil (ARICEPT) 5 MG tablet Take 5 mg by  mouth at bedtime.    . DULoxetine (CYMBALTA) 30 MG capsule Take 30 mg by mouth See admin instructions. Take one capsule (30 mg) by mouth with a 60 mg capsule for a total dose of 90 mg - daily at bedtime    . DULoxetine (CYMBALTA) 60 MG capsule Take 60 mg by mouth See admin instructions. Take one capsule (60 mg) by mouth with a 30 mg capsule for a total dose of 90 mg - daily at bedtime    . Fe Fum-FePoly-Vit C-Vit B3 (INTEGRA) 62.5-62.5-40-3 MG  CAPS TAKE 1 CAPSULE BY MOUTH ONCE DAILY    . HUMALOG KWIKPEN 100 UNIT/ML KwikPen Inject 4 Units as directed as needed. PRN if greater than 300    . hydrALAZINE (APRESOLINE) 25 MG tablet Take 25 mg by mouth as needed.    Marland Kitchen HYDROcodone-acetaminophen (NORCO/VICODIN) 5-325 MG tablet Take 1 tablet by mouth at bedtime.   0  . Insulin Glargine (TOUJEO SOLOSTAR Advance) Inject 18 Units into the skin daily.    Marland Kitchen linagliptin (TRADJENTA) 5 MG TABS tablet Take 5 mg by mouth daily with supper.     Marland Kitchen LORazepam (ATIVAN) 2 MG tablet Take 4 mg by mouth at bedtime.     Marland Kitchen MAGNESIUM CITRATE PO Take 1 tablet by mouth daily.     . melatonin 5 MG TABS Take 5 mg by mouth.    . memantine (NAMENDA) 5 MG tablet Take 5 mg by mouth 2 (two) times daily.    . metoprolol tartrate (LOPRESSOR) 25 MG tablet Take 0.5 tablets (12.5 mg total) by mouth 2 (two) times daily. 60 tablet 1  . Multiple Vitamins-Minerals (ICAPS) CAPS Take by mouth.    . Multiple Vitamins-Minerals (PRESERVISION AREDS 2) CAPS Take 1 capsule by mouth 2 (two) times daily.     . polyethylene glycol (MIRALAX / GLYCOLAX) packet Take 17 g by mouth daily. Mix in 8 oz fluid and drink    . pramipexole (MIRAPEX) 0.5 MG tablet Take 0.5 mg by mouth at bedtime.     . pravastatin (PRAVACHOL) 40 MG tablet Take 40 mg by mouth daily with supper.     . Propylene Glycol (SYSTANE BALANCE) 0.6 % SOLN Place 1 drop into both eyes 3 (three) times daily.     . solifenacin (VESICARE) 5 MG tablet Take 5 mg by mouth daily after supper.     . tamsulosin  (FLOMAX) 0.4 MG CAPS Take 0.4 mg by mouth daily after supper.     . vitamin C (ASCORBIC ACID) 500 MG tablet Take 500 mg by mouth daily.     No current facility-administered medications on file prior to visit.    Allergies:   Allergies  Allergen Reactions  . Neurontin [Gabapentin] Other (See Comments)    Edema (per [facility])  . Ramipril Cough    (per [facility])    Physical Exam General: well developed, well nourished elderly Caucasian male, seated, in no evident distress Head: head normocephalic and atraumatic.   Neck: supple with no carotid or supraclavicular bruits Cardiovascular: regular rate and rhythm, soft ejection systolic murmur over precordium. Musculoskeletal: no deformity Skin:  no rash/petichiae Vascular:  Normal pulses all extremities  Neurologic Exam Mental Status: Awake and fully alert. Oriented to place and time. Recent and remote memory intact. Attention span, concentration and fund of knowledge appropriate. Mood and affect appropriate.  Mini-Mental status exam score 28/30 with only 1 deficit in orientation.  Able to name only 8 animals which can walk on 4 legs.  Clock drawing 3/4.  Difficulty with copying intersecting pentagons. Cranial Nerves: Fundoscopic exam reveals sharp disc margins. Pupils equal, briskly reactive to light. Extraocular movements full without nystagmus. Visual fields full to confrontation. Hearing intact. Facial sensation intact. Face, tongue, palate moves normally and symmetrically.  Motor: Normal bulk and tone. Normal strength in all tested extremity muscles. Sensory.: intact to touch , pinprick are intact but ankle down, position and vibratory sensation is impaired.  Coordination: Rapid alternating movements normal in all extremities. Finger-to-nose and heel-to-shin performed accurately bilaterally.  Gait and Station: Arises from chair without difficulty. Stance is normal. Gait demonstrates normal stride  length and balance . Able to heel, toe and tandem walk with mild difficulty.  Reflexes: 1+ and symmetric except ankle jerks are depressed.. Toes downgoing.       ASSESSMENT: 86 year old pleasant Caucasian male with longstanding history of mild memory and cognitive difficulties likely due to mixed senile and vascular dementia.     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  He was encouraged to increase participation in cognitively challenging activities like solving crossword puzzles, playing bridge and sodoku.  We also discussed memory compensation strategies.  Check dementia panel labs and EEG study.  Continue Plavix for stroke prevention with aggressive risk factor modification with strict control of hypertension with blood pressure goal below 130/90, lipids with LDL cholesterol goal below 70 mg percent and diabetes with hemoglobin A1c goal below 6.5%.  Greater than 50% time during this 50-minute consultation visit was spent on counseling and coordination of care about memory loss and dementia and answering questions he was encouraged to use a walker at all times and we discussed fall safety precautions.  He will return for follow-up in the future in 3 months with my nurse practitioner Kristopher Zimmerman or call earlier if necessary. Kristopher Contras, MD  [facility] [address] [place], [place] [address]  Phone [phone] Fax [phone] Note: This document was prepared with digital dictation and possible smart phrase technology. Any transcriptional errors that result from this process are unintentional.

## 2020-01-02 LAB — DEMENTIA PANEL
Homocysteine: 12.6 umol/L (ref 0.0–21.3)
RPR Ser Ql: NONREACTIVE
TSH: 2.88 u[IU]/mL (ref 0.450–4.500)
Vitamin B-12: 574 pg/mL (ref 232–1245)

## 2020-01-08 ENCOUNTER — Other Ambulatory Visit: Payer: Self-pay

## 2020-01-08 ENCOUNTER — Encounter (INDEPENDENT_AMBULATORY_CARE_PROVIDER_SITE_OTHER): Payer: Medicare Other | Admitting: Ophthalmology

## 2020-01-08 DIAGNOSIS — H35033 Hypertensive retinopathy, bilateral: Secondary | ICD-10-CM | POA: Diagnosis not present

## 2020-01-08 DIAGNOSIS — H43813 Vitreous degeneration, bilateral: Secondary | ICD-10-CM

## 2020-01-08 DIAGNOSIS — I10 Essential (primary) hypertension: Secondary | ICD-10-CM | POA: Diagnosis not present

## 2020-01-08 DIAGNOSIS — H353231 Exudative age-related macular degeneration, bilateral, with active choroidal neovascularization: Secondary | ICD-10-CM

## 2020-01-23 DIAGNOSIS — R32 Unspecified urinary incontinence: Secondary | ICD-10-CM | POA: Diagnosis not present

## 2020-01-23 DIAGNOSIS — I700 Atherosclerosis of aorta: Secondary | ICD-10-CM | POA: Diagnosis not present

## 2020-01-23 DIAGNOSIS — Z9181 History of falling: Secondary | ICD-10-CM | POA: Diagnosis not present

## 2020-01-23 DIAGNOSIS — J449 Chronic obstructive pulmonary disease, unspecified: Secondary | ICD-10-CM | POA: Diagnosis not present

## 2020-01-23 DIAGNOSIS — E785 Hyperlipidemia, unspecified: Secondary | ICD-10-CM | POA: Diagnosis not present

## 2020-01-23 DIAGNOSIS — H9193 Unspecified hearing loss, bilateral: Secondary | ICD-10-CM | POA: Diagnosis not present

## 2020-01-23 DIAGNOSIS — D508 Other iron deficiency anemias: Secondary | ICD-10-CM | POA: Diagnosis not present

## 2020-01-23 DIAGNOSIS — Z7902 Long term (current) use of antithrombotics/antiplatelets: Secondary | ICD-10-CM | POA: Diagnosis not present

## 2020-01-23 DIAGNOSIS — I10 Essential (primary) hypertension: Secondary | ICD-10-CM | POA: Diagnosis not present

## 2020-01-23 DIAGNOSIS — E1129 Type 2 diabetes mellitus with other diabetic kidney complication: Secondary | ICD-10-CM | POA: Diagnosis not present

## 2020-01-23 DIAGNOSIS — I350 Nonrheumatic aortic (valve) stenosis: Secondary | ICD-10-CM | POA: Diagnosis not present

## 2020-01-23 DIAGNOSIS — K5900 Constipation, unspecified: Secondary | ICD-10-CM | POA: Diagnosis not present

## 2020-01-23 DIAGNOSIS — Z7984 Long term (current) use of oral hypoglycemic drugs: Secondary | ICD-10-CM | POA: Diagnosis not present

## 2020-01-23 DIAGNOSIS — Z8673 Personal history of transient ischemic attack (TIA), and cerebral infarction without residual deficits: Secondary | ICD-10-CM | POA: Diagnosis not present

## 2020-01-23 DIAGNOSIS — E1149 Type 2 diabetes mellitus with other diabetic neurological complication: Secondary | ICD-10-CM | POA: Diagnosis not present

## 2020-01-23 DIAGNOSIS — G8192 Hemiplegia, unspecified affecting left dominant side: Secondary | ICD-10-CM | POA: Diagnosis not present

## 2020-01-24 DIAGNOSIS — E1149 Type 2 diabetes mellitus with other diabetic neurological complication: Secondary | ICD-10-CM | POA: Diagnosis not present

## 2020-01-24 DIAGNOSIS — J449 Chronic obstructive pulmonary disease, unspecified: Secondary | ICD-10-CM | POA: Diagnosis not present

## 2020-01-24 DIAGNOSIS — I10 Essential (primary) hypertension: Secondary | ICD-10-CM | POA: Diagnosis not present

## 2020-01-24 DIAGNOSIS — I700 Atherosclerosis of aorta: Secondary | ICD-10-CM | POA: Diagnosis not present

## 2020-01-24 DIAGNOSIS — E1129 Type 2 diabetes mellitus with other diabetic kidney complication: Secondary | ICD-10-CM | POA: Diagnosis not present

## 2020-01-24 DIAGNOSIS — G8192 Hemiplegia, unspecified affecting left dominant side: Secondary | ICD-10-CM | POA: Diagnosis not present

## 2020-01-27 DIAGNOSIS — E1149 Type 2 diabetes mellitus with other diabetic neurological complication: Secondary | ICD-10-CM | POA: Diagnosis not present

## 2020-01-27 DIAGNOSIS — E1129 Type 2 diabetes mellitus with other diabetic kidney complication: Secondary | ICD-10-CM | POA: Diagnosis not present

## 2020-01-27 DIAGNOSIS — J449 Chronic obstructive pulmonary disease, unspecified: Secondary | ICD-10-CM | POA: Diagnosis not present

## 2020-01-27 DIAGNOSIS — I10 Essential (primary) hypertension: Secondary | ICD-10-CM | POA: Diagnosis not present

## 2020-01-27 DIAGNOSIS — I700 Atherosclerosis of aorta: Secondary | ICD-10-CM | POA: Diagnosis not present

## 2020-01-27 DIAGNOSIS — G8192 Hemiplegia, unspecified affecting left dominant side: Secondary | ICD-10-CM | POA: Diagnosis not present

## 2020-01-28 DIAGNOSIS — G8192 Hemiplegia, unspecified affecting left dominant side: Secondary | ICD-10-CM | POA: Diagnosis not present

## 2020-01-28 DIAGNOSIS — E1149 Type 2 diabetes mellitus with other diabetic neurological complication: Secondary | ICD-10-CM | POA: Diagnosis not present

## 2020-01-28 DIAGNOSIS — E1129 Type 2 diabetes mellitus with other diabetic kidney complication: Secondary | ICD-10-CM | POA: Diagnosis not present

## 2020-01-28 DIAGNOSIS — I10 Essential (primary) hypertension: Secondary | ICD-10-CM | POA: Diagnosis not present

## 2020-01-28 DIAGNOSIS — J449 Chronic obstructive pulmonary disease, unspecified: Secondary | ICD-10-CM | POA: Diagnosis not present

## 2020-01-28 DIAGNOSIS — I700 Atherosclerosis of aorta: Secondary | ICD-10-CM | POA: Diagnosis not present

## 2020-01-29 ENCOUNTER — Other Ambulatory Visit: Payer: Self-pay

## 2020-01-29 ENCOUNTER — Ambulatory Visit (INDEPENDENT_AMBULATORY_CARE_PROVIDER_SITE_OTHER): Payer: Medicare Other | Admitting: Neurology

## 2020-01-29 DIAGNOSIS — R410 Disorientation, unspecified: Secondary | ICD-10-CM | POA: Diagnosis not present

## 2020-01-29 DIAGNOSIS — F03918 Unspecified dementia, unspecified severity, with other behavioral disturbance: Secondary | ICD-10-CM

## 2020-01-30 DIAGNOSIS — G8192 Hemiplegia, unspecified affecting left dominant side: Secondary | ICD-10-CM | POA: Diagnosis not present

## 2020-01-30 DIAGNOSIS — I700 Atherosclerosis of aorta: Secondary | ICD-10-CM | POA: Diagnosis not present

## 2020-01-30 DIAGNOSIS — E1149 Type 2 diabetes mellitus with other diabetic neurological complication: Secondary | ICD-10-CM | POA: Diagnosis not present

## 2020-01-30 DIAGNOSIS — I10 Essential (primary) hypertension: Secondary | ICD-10-CM | POA: Diagnosis not present

## 2020-01-30 DIAGNOSIS — J449 Chronic obstructive pulmonary disease, unspecified: Secondary | ICD-10-CM | POA: Diagnosis not present

## 2020-01-30 DIAGNOSIS — E1129 Type 2 diabetes mellitus with other diabetic kidney complication: Secondary | ICD-10-CM | POA: Diagnosis not present

## 2020-01-30 NOTE — Progress Notes (Signed)
Kindly inform patient that EEG was normal

## 2020-02-04 ENCOUNTER — Telehealth: Payer: Self-pay

## 2020-02-04 DIAGNOSIS — G8192 Hemiplegia, unspecified affecting left dominant side: Secondary | ICD-10-CM | POA: Diagnosis not present

## 2020-02-04 DIAGNOSIS — E1149 Type 2 diabetes mellitus with other diabetic neurological complication: Secondary | ICD-10-CM | POA: Diagnosis not present

## 2020-02-04 DIAGNOSIS — J449 Chronic obstructive pulmonary disease, unspecified: Secondary | ICD-10-CM | POA: Diagnosis not present

## 2020-02-04 DIAGNOSIS — I10 Essential (primary) hypertension: Secondary | ICD-10-CM | POA: Diagnosis not present

## 2020-02-04 DIAGNOSIS — I700 Atherosclerosis of aorta: Secondary | ICD-10-CM | POA: Diagnosis not present

## 2020-02-04 DIAGNOSIS — E1129 Type 2 diabetes mellitus with other diabetic kidney complication: Secondary | ICD-10-CM | POA: Diagnosis not present

## 2020-02-04 NOTE — Telephone Encounter (Signed)
-----   Message from Garvin Fila, MD sent at 01/30/2020  7:42 AM EDT ----- Mitchell Heir inform patient that EEG was normal

## 2020-02-04 NOTE — Telephone Encounter (Signed)
I called pts wife Jocelyn Lamer that pts EEG was normal.Pt verbalized understanding

## 2020-02-05 DIAGNOSIS — E1149 Type 2 diabetes mellitus with other diabetic neurological complication: Secondary | ICD-10-CM | POA: Diagnosis not present

## 2020-02-05 DIAGNOSIS — I10 Essential (primary) hypertension: Secondary | ICD-10-CM | POA: Diagnosis not present

## 2020-02-05 DIAGNOSIS — G8192 Hemiplegia, unspecified affecting left dominant side: Secondary | ICD-10-CM | POA: Diagnosis not present

## 2020-02-05 DIAGNOSIS — E1129 Type 2 diabetes mellitus with other diabetic kidney complication: Secondary | ICD-10-CM | POA: Diagnosis not present

## 2020-02-05 DIAGNOSIS — I700 Atherosclerosis of aorta: Secondary | ICD-10-CM | POA: Diagnosis not present

## 2020-02-05 DIAGNOSIS — J449 Chronic obstructive pulmonary disease, unspecified: Secondary | ICD-10-CM | POA: Diagnosis not present

## 2020-02-06 DIAGNOSIS — G8192 Hemiplegia, unspecified affecting left dominant side: Secondary | ICD-10-CM | POA: Diagnosis not present

## 2020-02-06 DIAGNOSIS — I10 Essential (primary) hypertension: Secondary | ICD-10-CM | POA: Diagnosis not present

## 2020-02-06 DIAGNOSIS — I700 Atherosclerosis of aorta: Secondary | ICD-10-CM | POA: Diagnosis not present

## 2020-02-06 DIAGNOSIS — E1149 Type 2 diabetes mellitus with other diabetic neurological complication: Secondary | ICD-10-CM | POA: Diagnosis not present

## 2020-02-06 DIAGNOSIS — J449 Chronic obstructive pulmonary disease, unspecified: Secondary | ICD-10-CM | POA: Diagnosis not present

## 2020-02-06 DIAGNOSIS — E1129 Type 2 diabetes mellitus with other diabetic kidney complication: Secondary | ICD-10-CM | POA: Diagnosis not present

## 2020-02-09 DIAGNOSIS — E1129 Type 2 diabetes mellitus with other diabetic kidney complication: Secondary | ICD-10-CM | POA: Diagnosis not present

## 2020-02-09 DIAGNOSIS — E1149 Type 2 diabetes mellitus with other diabetic neurological complication: Secondary | ICD-10-CM | POA: Diagnosis not present

## 2020-02-09 DIAGNOSIS — G8192 Hemiplegia, unspecified affecting left dominant side: Secondary | ICD-10-CM | POA: Diagnosis not present

## 2020-02-09 DIAGNOSIS — I10 Essential (primary) hypertension: Secondary | ICD-10-CM | POA: Diagnosis not present

## 2020-02-09 DIAGNOSIS — R441 Visual hallucinations: Secondary | ICD-10-CM | POA: Diagnosis not present

## 2020-02-09 DIAGNOSIS — I700 Atherosclerosis of aorta: Secondary | ICD-10-CM | POA: Diagnosis not present

## 2020-02-09 DIAGNOSIS — E1165 Type 2 diabetes mellitus with hyperglycemia: Secondary | ICD-10-CM | POA: Diagnosis not present

## 2020-02-09 DIAGNOSIS — E782 Mixed hyperlipidemia: Secondary | ICD-10-CM | POA: Diagnosis not present

## 2020-02-09 DIAGNOSIS — F0390 Unspecified dementia without behavioral disturbance: Secondary | ICD-10-CM | POA: Diagnosis not present

## 2020-02-09 DIAGNOSIS — J449 Chronic obstructive pulmonary disease, unspecified: Secondary | ICD-10-CM | POA: Diagnosis not present

## 2020-02-09 DIAGNOSIS — I129 Hypertensive chronic kidney disease with stage 1 through stage 4 chronic kidney disease, or unspecified chronic kidney disease: Secondary | ICD-10-CM | POA: Diagnosis not present

## 2020-02-10 DIAGNOSIS — J449 Chronic obstructive pulmonary disease, unspecified: Secondary | ICD-10-CM | POA: Diagnosis not present

## 2020-02-10 DIAGNOSIS — G8192 Hemiplegia, unspecified affecting left dominant side: Secondary | ICD-10-CM | POA: Diagnosis not present

## 2020-02-10 DIAGNOSIS — E1129 Type 2 diabetes mellitus with other diabetic kidney complication: Secondary | ICD-10-CM | POA: Diagnosis not present

## 2020-02-10 DIAGNOSIS — E1149 Type 2 diabetes mellitus with other diabetic neurological complication: Secondary | ICD-10-CM | POA: Diagnosis not present

## 2020-02-10 DIAGNOSIS — I700 Atherosclerosis of aorta: Secondary | ICD-10-CM | POA: Diagnosis not present

## 2020-02-10 DIAGNOSIS — I10 Essential (primary) hypertension: Secondary | ICD-10-CM | POA: Diagnosis not present

## 2020-02-11 DIAGNOSIS — J449 Chronic obstructive pulmonary disease, unspecified: Secondary | ICD-10-CM | POA: Diagnosis not present

## 2020-02-11 DIAGNOSIS — G8192 Hemiplegia, unspecified affecting left dominant side: Secondary | ICD-10-CM | POA: Diagnosis not present

## 2020-02-11 DIAGNOSIS — I10 Essential (primary) hypertension: Secondary | ICD-10-CM | POA: Diagnosis not present

## 2020-02-11 DIAGNOSIS — E1129 Type 2 diabetes mellitus with other diabetic kidney complication: Secondary | ICD-10-CM | POA: Diagnosis not present

## 2020-02-11 DIAGNOSIS — I700 Atherosclerosis of aorta: Secondary | ICD-10-CM | POA: Diagnosis not present

## 2020-02-11 DIAGNOSIS — E1149 Type 2 diabetes mellitus with other diabetic neurological complication: Secondary | ICD-10-CM | POA: Diagnosis not present

## 2020-02-12 ENCOUNTER — Encounter (INDEPENDENT_AMBULATORY_CARE_PROVIDER_SITE_OTHER): Payer: Medicare Other | Admitting: Ophthalmology

## 2020-02-12 ENCOUNTER — Other Ambulatory Visit: Payer: Self-pay

## 2020-02-12 DIAGNOSIS — I10 Essential (primary) hypertension: Secondary | ICD-10-CM | POA: Diagnosis not present

## 2020-02-12 DIAGNOSIS — H43813 Vitreous degeneration, bilateral: Secondary | ICD-10-CM

## 2020-02-12 DIAGNOSIS — H353231 Exudative age-related macular degeneration, bilateral, with active choroidal neovascularization: Secondary | ICD-10-CM | POA: Diagnosis not present

## 2020-02-12 DIAGNOSIS — H35033 Hypertensive retinopathy, bilateral: Secondary | ICD-10-CM

## 2020-02-17 DIAGNOSIS — I10 Essential (primary) hypertension: Secondary | ICD-10-CM | POA: Diagnosis not present

## 2020-02-17 DIAGNOSIS — I700 Atherosclerosis of aorta: Secondary | ICD-10-CM | POA: Diagnosis not present

## 2020-02-17 DIAGNOSIS — E1129 Type 2 diabetes mellitus with other diabetic kidney complication: Secondary | ICD-10-CM | POA: Diagnosis not present

## 2020-02-17 DIAGNOSIS — E1149 Type 2 diabetes mellitus with other diabetic neurological complication: Secondary | ICD-10-CM | POA: Diagnosis not present

## 2020-02-17 DIAGNOSIS — J449 Chronic obstructive pulmonary disease, unspecified: Secondary | ICD-10-CM | POA: Diagnosis not present

## 2020-02-17 DIAGNOSIS — G8192 Hemiplegia, unspecified affecting left dominant side: Secondary | ICD-10-CM | POA: Diagnosis not present

## 2020-02-20 DIAGNOSIS — E1129 Type 2 diabetes mellitus with other diabetic kidney complication: Secondary | ICD-10-CM | POA: Diagnosis not present

## 2020-02-20 DIAGNOSIS — I700 Atherosclerosis of aorta: Secondary | ICD-10-CM | POA: Diagnosis not present

## 2020-02-20 DIAGNOSIS — I10 Essential (primary) hypertension: Secondary | ICD-10-CM | POA: Diagnosis not present

## 2020-02-20 DIAGNOSIS — G8192 Hemiplegia, unspecified affecting left dominant side: Secondary | ICD-10-CM | POA: Diagnosis not present

## 2020-02-20 DIAGNOSIS — E1149 Type 2 diabetes mellitus with other diabetic neurological complication: Secondary | ICD-10-CM | POA: Diagnosis not present

## 2020-02-20 DIAGNOSIS — J449 Chronic obstructive pulmonary disease, unspecified: Secondary | ICD-10-CM | POA: Diagnosis not present

## 2020-02-22 DIAGNOSIS — E1149 Type 2 diabetes mellitus with other diabetic neurological complication: Secondary | ICD-10-CM | POA: Diagnosis not present

## 2020-02-22 DIAGNOSIS — K5900 Constipation, unspecified: Secondary | ICD-10-CM | POA: Diagnosis not present

## 2020-02-22 DIAGNOSIS — G8192 Hemiplegia, unspecified affecting left dominant side: Secondary | ICD-10-CM | POA: Diagnosis not present

## 2020-02-22 DIAGNOSIS — I350 Nonrheumatic aortic (valve) stenosis: Secondary | ICD-10-CM | POA: Diagnosis not present

## 2020-02-22 DIAGNOSIS — D508 Other iron deficiency anemias: Secondary | ICD-10-CM | POA: Diagnosis not present

## 2020-02-22 DIAGNOSIS — E1129 Type 2 diabetes mellitus with other diabetic kidney complication: Secondary | ICD-10-CM | POA: Diagnosis not present

## 2020-02-22 DIAGNOSIS — J449 Chronic obstructive pulmonary disease, unspecified: Secondary | ICD-10-CM | POA: Diagnosis not present

## 2020-02-22 DIAGNOSIS — I700 Atherosclerosis of aorta: Secondary | ICD-10-CM | POA: Diagnosis not present

## 2020-02-22 DIAGNOSIS — Z8673 Personal history of transient ischemic attack (TIA), and cerebral infarction without residual deficits: Secondary | ICD-10-CM | POA: Diagnosis not present

## 2020-02-22 DIAGNOSIS — I10 Essential (primary) hypertension: Secondary | ICD-10-CM | POA: Diagnosis not present

## 2020-02-22 DIAGNOSIS — Z7984 Long term (current) use of oral hypoglycemic drugs: Secondary | ICD-10-CM | POA: Diagnosis not present

## 2020-02-22 DIAGNOSIS — E785 Hyperlipidemia, unspecified: Secondary | ICD-10-CM | POA: Diagnosis not present

## 2020-02-22 DIAGNOSIS — Z7902 Long term (current) use of antithrombotics/antiplatelets: Secondary | ICD-10-CM | POA: Diagnosis not present

## 2020-02-22 DIAGNOSIS — Z9181 History of falling: Secondary | ICD-10-CM | POA: Diagnosis not present

## 2020-02-22 DIAGNOSIS — H9193 Unspecified hearing loss, bilateral: Secondary | ICD-10-CM | POA: Diagnosis not present

## 2020-02-22 DIAGNOSIS — R32 Unspecified urinary incontinence: Secondary | ICD-10-CM | POA: Diagnosis not present

## 2020-02-24 DIAGNOSIS — E1129 Type 2 diabetes mellitus with other diabetic kidney complication: Secondary | ICD-10-CM | POA: Diagnosis not present

## 2020-02-24 DIAGNOSIS — I700 Atherosclerosis of aorta: Secondary | ICD-10-CM | POA: Diagnosis not present

## 2020-02-24 DIAGNOSIS — J449 Chronic obstructive pulmonary disease, unspecified: Secondary | ICD-10-CM | POA: Diagnosis not present

## 2020-02-24 DIAGNOSIS — G8192 Hemiplegia, unspecified affecting left dominant side: Secondary | ICD-10-CM | POA: Diagnosis not present

## 2020-02-24 DIAGNOSIS — I10 Essential (primary) hypertension: Secondary | ICD-10-CM | POA: Diagnosis not present

## 2020-02-24 DIAGNOSIS — E1149 Type 2 diabetes mellitus with other diabetic neurological complication: Secondary | ICD-10-CM | POA: Diagnosis not present

## 2020-02-27 DIAGNOSIS — G8192 Hemiplegia, unspecified affecting left dominant side: Secondary | ICD-10-CM | POA: Diagnosis not present

## 2020-02-27 DIAGNOSIS — E1129 Type 2 diabetes mellitus with other diabetic kidney complication: Secondary | ICD-10-CM | POA: Diagnosis not present

## 2020-02-27 DIAGNOSIS — I700 Atherosclerosis of aorta: Secondary | ICD-10-CM | POA: Diagnosis not present

## 2020-02-27 DIAGNOSIS — E1149 Type 2 diabetes mellitus with other diabetic neurological complication: Secondary | ICD-10-CM | POA: Diagnosis not present

## 2020-02-27 DIAGNOSIS — J449 Chronic obstructive pulmonary disease, unspecified: Secondary | ICD-10-CM | POA: Diagnosis not present

## 2020-02-27 DIAGNOSIS — I10 Essential (primary) hypertension: Secondary | ICD-10-CM | POA: Diagnosis not present

## 2020-03-04 DIAGNOSIS — I700 Atherosclerosis of aorta: Secondary | ICD-10-CM | POA: Diagnosis not present

## 2020-03-04 DIAGNOSIS — E1149 Type 2 diabetes mellitus with other diabetic neurological complication: Secondary | ICD-10-CM | POA: Diagnosis not present

## 2020-03-04 DIAGNOSIS — J449 Chronic obstructive pulmonary disease, unspecified: Secondary | ICD-10-CM | POA: Diagnosis not present

## 2020-03-04 DIAGNOSIS — I10 Essential (primary) hypertension: Secondary | ICD-10-CM | POA: Diagnosis not present

## 2020-03-04 DIAGNOSIS — E1129 Type 2 diabetes mellitus with other diabetic kidney complication: Secondary | ICD-10-CM | POA: Diagnosis not present

## 2020-03-04 DIAGNOSIS — G8192 Hemiplegia, unspecified affecting left dominant side: Secondary | ICD-10-CM | POA: Diagnosis not present

## 2020-03-18 ENCOUNTER — Other Ambulatory Visit: Payer: Self-pay

## 2020-03-18 ENCOUNTER — Encounter (INDEPENDENT_AMBULATORY_CARE_PROVIDER_SITE_OTHER): Payer: Medicare Other | Admitting: Ophthalmology

## 2020-03-18 DIAGNOSIS — H43813 Vitreous degeneration, bilateral: Secondary | ICD-10-CM

## 2020-03-18 DIAGNOSIS — I10 Essential (primary) hypertension: Secondary | ICD-10-CM

## 2020-03-18 DIAGNOSIS — H35033 Hypertensive retinopathy, bilateral: Secondary | ICD-10-CM

## 2020-03-18 DIAGNOSIS — H353231 Exudative age-related macular degeneration, bilateral, with active choroidal neovascularization: Secondary | ICD-10-CM | POA: Diagnosis not present

## 2020-03-29 DIAGNOSIS — H60311 Diffuse otitis externa, right ear: Secondary | ICD-10-CM | POA: Diagnosis not present

## 2020-03-29 DIAGNOSIS — H6983 Other specified disorders of Eustachian tube, bilateral: Secondary | ICD-10-CM | POA: Diagnosis not present

## 2020-03-29 DIAGNOSIS — H9212 Otorrhea, left ear: Secondary | ICD-10-CM | POA: Diagnosis not present

## 2020-04-01 DIAGNOSIS — Z9622 Myringotomy tube(s) status: Secondary | ICD-10-CM | POA: Diagnosis not present

## 2020-04-01 DIAGNOSIS — H60333 Swimmer's ear, bilateral: Secondary | ICD-10-CM | POA: Diagnosis not present

## 2020-04-09 DIAGNOSIS — H7401 Tympanosclerosis, right ear: Secondary | ICD-10-CM | POA: Diagnosis not present

## 2020-04-09 DIAGNOSIS — H906 Mixed conductive and sensorineural hearing loss, bilateral: Secondary | ICD-10-CM | POA: Insufficient documentation

## 2020-04-09 DIAGNOSIS — Z8669 Personal history of other diseases of the nervous system and sense organs: Secondary | ICD-10-CM | POA: Insufficient documentation

## 2020-04-09 DIAGNOSIS — Z9622 Myringotomy tube(s) status: Secondary | ICD-10-CM | POA: Diagnosis not present

## 2020-04-12 DIAGNOSIS — E1121 Type 2 diabetes mellitus with diabetic nephropathy: Secondary | ICD-10-CM | POA: Diagnosis not present

## 2020-04-12 DIAGNOSIS — I129 Hypertensive chronic kidney disease with stage 1 through stage 4 chronic kidney disease, or unspecified chronic kidney disease: Secondary | ICD-10-CM | POA: Diagnosis not present

## 2020-04-12 DIAGNOSIS — E1165 Type 2 diabetes mellitus with hyperglycemia: Secondary | ICD-10-CM | POA: Diagnosis not present

## 2020-04-12 DIAGNOSIS — E782 Mixed hyperlipidemia: Secondary | ICD-10-CM | POA: Diagnosis not present

## 2020-04-12 DIAGNOSIS — Z23 Encounter for immunization: Secondary | ICD-10-CM | POA: Diagnosis not present

## 2020-04-14 ENCOUNTER — Other Ambulatory Visit: Payer: Self-pay

## 2020-04-14 ENCOUNTER — Encounter: Payer: Self-pay | Admitting: Adult Health

## 2020-04-14 ENCOUNTER — Ambulatory Visit (INDEPENDENT_AMBULATORY_CARE_PROVIDER_SITE_OTHER): Payer: Medicare Other | Admitting: Adult Health

## 2020-04-14 VITALS — BP 151/80 | HR 57

## 2020-04-14 DIAGNOSIS — Z8673 Personal history of transient ischemic attack (TIA), and cerebral infarction without residual deficits: Secondary | ICD-10-CM | POA: Diagnosis not present

## 2020-04-14 DIAGNOSIS — F0150 Vascular dementia without behavioral disturbance: Secondary | ICD-10-CM

## 2020-04-14 NOTE — Progress Notes (Signed)
[facility] [address] [place]. [place] [address] [phone]       OFFICE FOLLOW UP NOTE  Kristopher Zimmerman Date of Birth:  Oct 28, 1923 Medical Record Number:  [id number]   Referring MD: Merrilee Seashore  Reason for Referral: Dementia  CC: Memory loss   HPI:   Today, 04/14/2020, Kristopher Zimmerman returns for dementia follow-up accompanied by his wife  He has been doing well since prior visit with with memory stable and denies worsening.  Wife does report improvement hallucinations with only occasional occurrences since initiating Namenda and Aricept.  No other behavioral concerns. Remains on Namenda 10 mg twice daily and Aricept 5 mg daily tolerating well without side effects Maintain ADLs independently and only mild assistance with IADLs Ambulates with RW at home - use of w/c for long distance  Blood pressure and glucose levels routinely monitored at home which has been stable Blood pressure average 140/70s Glucose levels typically range 100-150 Remains on Plavix and pravastatin for secondary stroke prevention without side effects  EEG completed 01/29/2020 which did not show any abnormality Dementia panel 01/01/2020 all WNL  No concerns at this time    History provided for reference purposes only Initial visit 01/01/2020 Dr. Leonie Man: Mr. Croft is a 86 year old pleasant Caucasian male seen today for initial office consultation visit for memory loss and dementia.  He is accompanied by his wife.  History is obtained from them and review of referral notes as well as electronic medical records and available imaging films in PACS.  He has had mild memory difficulties for the last several years but over the last couple of months they seem to be getting worse.  He has had increasing visual hallucinations and sees people's and in fact at times even has conversations with them.  He does not hear voices.  He does not get agitated or upset.  He is also had increasing short-term  memory difficulties and does get confused sometimes.  He had a CT scan of the head done on 11/28/2019 which I personally reviewed shows old remote age right pontine infarct and mild generalized cerebral atrophy.  Previous MRI on 07/25/2015 had shown an acute right pontine infarct.  EEG on 10/25/2016 showed mild generalized slowing.  Patient was started on Aricept 5 mg daily 2 weeks ago as well as Namenda 5 mg twice daily 2 months ago and now the dose has been increased to 10 mg twice daily a month ago.  The wife has noticed slight improvement but in the last few weeks she is concerned that he is getting worse particularly his hallucinations and behavior.  Patient has a family history of Alzheimer's in his sister.  Patient does ambulate quite well but uses a walker.  He has had very rare occasional falls.  He needs mild help only with activities of daily living like meals but otherwise fairly independent.  He can be left alone for hours.  He does not wander off.-No unsafe behaviors noted.  Does not get agitations delusions.  He has prior history of 2 strokes and has residual mild gait and balance difficulties for which he needs a walker.  He had a left basal ganglia subcortical infarct in April 2016 as well as right pontine infarct in December 2016 and has vascular risk factors of diabetes hyperlipidemia and age and cerebrovascular disease.  He is currently on Plavix which is tolerating well with minor bruising.  His blood pressure seems under good control.  Though it is elevated today at  168/68.  His diabetes and cholesterol control has been satisfactory.  He does have diabetic neuropathy and paresthesias in his feet and slight gait imbalance.    ROS:   14 system review of systems is positive for those positive in HPI and all other systems negative    PMH:  Past Medical History:  Diagnosis Date  . Diabetes mellitus without complication ([place])   . Hyperlipidemia   . Hypertension   . Macular degeneration  of right eye   . Neuropathy   . Neuropathy, diabetic ([place])   . Renal insufficiency   . Stroke [facility]) 11-28-2014   Also 07-25-2015  . TIA (transient ischemic attack) 05/06/2016    Social History:  Social History   Socioeconomic History  . Marital status: Married    Spouse name: Vicky  . Number of children: 3  . Years of education: Not on file  . Highest education level: Not on file  Occupational History  . Not on file  Tobacco Use  . Smoking status: Former Smoker    Quit date: 12/22/1979    Years since quitting: 40.3  . Smokeless tobacco: Never Used  Vaping Use  . Vaping Use: Never used  Substance and Sexual Activity  . Alcohol use: Yes    Alcohol/week: 1.0 standard drink    Types: 1 Glasses of wine per week    Comment: wine  . Drug use: No  . Sexual activity: Not on file  Other Topics Concern  . Not on file  Social History Narrative   Lives at home with wife.  3 grown kids.  Retired.  Caffeine none.     Social Determinants of Health   Financial Resource Strain:   . Difficulty of Paying Living Expenses: Not on file  Food Insecurity:   . Worried About Charity fundraiser in the Last Year: Not on file  . Ran Out of Food in the Last Year: Not on file  Transportation Needs:   . Lack of Transportation (Medical): Not on file  . Lack of Transportation (Non-Medical): Not on file  Physical Activity:   . Days of Exercise per Week: Not on file  . Minutes of Exercise per Session: Not on file  Stress:   . Feeling of Stress : Not on file  Social Connections:   . Frequency of Communication with Friends and Family: Not on file  . Frequency of Social Gatherings with Friends and Family: Not on file  . Attends Religious Services: Not on file  . Active Member of Clubs or Organizations: Not on file  . Attends Archivist Meetings: Not on file  . Marital Status: Not on file  Intimate Partner Violence:   . Fear of Current or Ex-Partner: Not on file  . Emotionally Abused:  Not on file  . Physically Abused: Not on file  . Sexually Abused: Not on file    Medications:   Current Outpatient Medications on File Prior to Visit  Medication Sig Dispense Refill  . amLODipine (NORVASC) 5 MG tablet Take 1 tablet (5 mg total) by mouth daily. 90 tablet 3  . ascorbic acid (VITAMIN C) 1000 MG tablet Take by mouth.    . Besifloxacin HCl (BESIVANCE) 0.6 % SUSP Place 1 drop into both eyes See admin instructions. Instill one drop into both eyes three times on day of monthly eye injections and four times on the day after    . calcium-vitamin D (OSCAL WITH D) 500-200 MG-UNIT TABS tablet Take by mouth.    Marland Kitchen  carboxymethylcellulose (REFRESH PLUS) 0.5 % SOLN 1 drop 3 (three) times daily as needed.    . clopidogrel (PLAVIX) 75 MG tablet Take 1 tablet (75 mg total) by mouth daily. 30 tablet 1  . cyclobenzaprine (FLEXERIL) 10 MG tablet Take 10 mg by mouth at bedtime.    Marland Kitchen Dextromethorphan-guaiFENesin (MUCINEX DM PO) Take 1 capsule by mouth every 12 (twelve) hours.    Marland Kitchen donepezil (ARICEPT) 5 MG tablet Take 5 mg by mouth at bedtime.    . DULoxetine (CYMBALTA) 30 MG capsule Take 30 mg by mouth See admin instructions. Take one capsule (30 mg) by mouth with a 60 mg capsule for a total dose of 90 mg - daily at bedtime    . DULoxetine (CYMBALTA) 60 MG capsule Take 60 mg by mouth See admin instructions. Take one capsule (60 mg) by mouth with a 30 mg capsule for a total dose of 90 mg - daily at bedtime    . Fe Fum-FePoly-Vit C-Vit B3 (INTEGRA) 62.5-62.5-40-3 MG CAPS TAKE 1 CAPSULE BY MOUTH ONCE DAILY    . HUMALOG KWIKPEN 100 UNIT/ML KwikPen Inject 4 Units as directed as needed. PRN if greater than 300    . hydrALAZINE (APRESOLINE) 25 MG tablet Take 25 mg by mouth as needed.    Marland Kitchen HYDROcodone-acetaminophen (NORCO/VICODIN) 5-325 MG tablet Take 1 tablet by mouth at bedtime.   0  . Insulin Glargine (TOUJEO SOLOSTAR [place]) Inject 18 Units into the skin daily.    Marland Kitchen linagliptin (TRADJENTA) 5 MG TABS tablet  Take 5 mg by mouth daily with supper.     Marland Kitchen LORazepam (ATIVAN) 2 MG tablet Take 4 mg by mouth at bedtime.     Marland Kitchen MAGNESIUM CITRATE PO Take 1 tablet by mouth daily.     . melatonin 5 MG TABS Take 5 mg by mouth.    . memantine (NAMENDA) 5 MG tablet Take 5 mg by mouth 2 (two) times daily.    . metoprolol tartrate (LOPRESSOR) 25 MG tablet Take 0.5 tablets (12.5 mg total) by mouth 2 (two) times daily. 60 tablet 1  . Multiple Vitamins-Minerals (ICAPS) CAPS Take by mouth.    . Multiple Vitamins-Minerals (PRESERVISION AREDS 2) CAPS Take 1 capsule by mouth 2 (two) times daily.     . polyethylene glycol (MIRALAX / GLYCOLAX) packet Take 17 g by mouth daily. Mix in 8 oz fluid and drink    . pramipexole (MIRAPEX) 0.5 MG tablet Take 0.5 mg by mouth at bedtime.     . pravastatin (PRAVACHOL) 40 MG tablet Take 40 mg by mouth daily with supper.     . Propylene Glycol (SYSTANE BALANCE) 0.6 % SOLN Place 1 drop into both eyes 3 (three) times daily.     . solifenacin (VESICARE) 5 MG tablet Take 5 mg by mouth daily after supper.     . tamsulosin (FLOMAX) 0.4 MG CAPS Take 0.4 mg by mouth daily after supper.     . vitamin C (ASCORBIC ACID) 500 MG tablet Take 500 mg by mouth daily.     No current facility-administered medications on file prior to visit.    Allergies:   Allergies  Allergen Reactions  . Neurontin [Gabapentin] Other (See Comments)    Edema (per [facility])  . Ramipril Cough    (per [facility]    Physical Exam General: well developed, well nourished pleasant elderly Caucasian male, seated, in no evident distress Head: head normocephalic and atraumatic.   Neck: supple with no carotid  or supraclavicular bruits Cardiovascular: regular rate and rhythm, soft ejection systolic murmur over precordium. Musculoskeletal: no deformity Skin:  no rash/petichiae Vascular:  Normal pulses all extremities  Neurologic Exam Mental Status: Awake and fully alert. Oriented to  place and time. Recent memory impaired and remote memory intact. Attention span, concentration and fund of knowledge mostly appropriate during visit with wife providing some information. Mood and affect appropriate.   MMSE - Mini Mental State Exam 04/14/2020 01/01/2020  Orientation to time 5 5  Orientation to Place 4 4  Registration 3 3  Attention/ Calculation 5 5  Recall 2 3  Language- name 2 objects 2 2  Language- repeat 1 1  Language- follow 3 step command 3 3  Language- read & follow direction 1 1  Write a sentence 1 1  Copy design 1 1  Total score 28 29   Cranial Nerves:  Pupils equal, briskly reactive to light. Extraocular movements full without nystagmus. Visual fields full to confrontation. Hearing intact. Facial sensation intact. Face, tongue, palate moves normally and symmetrically.  Motor: Normal bulk and tone. Normal strength in all tested extremity muscles. Sensory.: intact to touch , pinprick are intact but ankle down, position and vibratory sensation is impaired.  Coordination: Rapid alternating movements normal in all extremities. Finger-to-nose and heel-to-shin performed accurately bilaterally. Gait and Station: Deferred as rolling walker not present Reflexes: 1+ and symmetric except ankle jerks are depressed.. Toes downgoing.       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  Prior history of stroke, HTN, HLD and DM.  EEG and dementia panel unremarkable.   Cognition has been stable without worsening of prior concerns regarding hallucinations which have since improved since initiating Namenda and Aricept by PCP.  Continue Namenda and Aricept prescribed and managed by PCP.  Also discussed importance of managing stroke risk factors, adequate exercise, healthy diet and adequate sleep as well as memory exercises.  Continued monitoring and management  can be completed by PCP Advised to follow-up on an as-needed basis  I spent 30 minutes of face-to-face and non-face-to-face time with patient and wife.  This included previsit chart review, lab review, study review, order entry, electronic health record documentation, patient education/discussion regarding review of MMSE, review of current medications, importance of managing stroke risk factors, vascular dementia education and answered all questions to patient and wife satisfaction  Frann Rider, AGNP-BC  [facility] [address] [place], [place] [address]  Phone [phone] Fax [phone] Note: This document was prepared with digital dictation and possible smart phrase technology. Any transcriptional errors that result from this process are unintentional.

## 2020-04-14 NOTE — Patient Instructions (Addendum)
Your Plan:  Continue namenda and aricept - your PCP will continue to prescribe and manage  Continue to do exercises physically and mentally - cross word puzzles, word search, reading and card games Important to manage stroke risk factors such as cholesterol, diabetes and blood pressure   Follow up as needed     Thank you for coming to see [place] at [facility]. I hope we have been able to provide you high quality care today.  You may receive a patient satisfaction survey over the next few weeks. We would appreciate your feedback and comments so that we may continue to improve ourselves and the health of our patients.

## 2020-04-15 NOTE — Progress Notes (Signed)
I agree with the above plan 

## 2020-04-16 DIAGNOSIS — H6983 Other specified disorders of Eustachian tube, bilateral: Secondary | ICD-10-CM | POA: Diagnosis not present

## 2020-04-16 DIAGNOSIS — H906 Mixed conductive and sensorineural hearing loss, bilateral: Secondary | ICD-10-CM | POA: Diagnosis not present

## 2020-04-19 ENCOUNTER — Ambulatory Visit: Payer: Medicare Other | Admitting: Adult Health

## 2020-04-22 ENCOUNTER — Encounter (INDEPENDENT_AMBULATORY_CARE_PROVIDER_SITE_OTHER): Payer: Medicare Other | Admitting: Ophthalmology

## 2020-04-22 ENCOUNTER — Other Ambulatory Visit: Payer: Self-pay

## 2020-04-22 DIAGNOSIS — H43813 Vitreous degeneration, bilateral: Secondary | ICD-10-CM | POA: Diagnosis not present

## 2020-04-22 DIAGNOSIS — H353231 Exudative age-related macular degeneration, bilateral, with active choroidal neovascularization: Secondary | ICD-10-CM | POA: Diagnosis not present

## 2020-04-22 DIAGNOSIS — H35033 Hypertensive retinopathy, bilateral: Secondary | ICD-10-CM | POA: Diagnosis not present

## 2020-04-22 DIAGNOSIS — I10 Essential (primary) hypertension: Secondary | ICD-10-CM

## 2020-05-11 NOTE — Progress Notes (Signed)
HPI: Follow-up aortic stenosis. Echocardiogram November 2015 showed normal LV function, mild aortic stenosis with a mean gradient of 10 mmHg, mild mitral regurgitation and mild biatrial enlargement. Nuclear study 2015 showed an ejection fraction of 44%. Perfusion was normal. Had CVA 12/16. Echocardiogram March 2020 showed normal LV function, mild left atrial enlargement and mild aortic stenosis.  Since he was last seenhe denies increased dyspnea, chest pain, palpitations or syncope.  Occasional mild pedal edema.  Current Outpatient Medications  Medication Sig Dispense Refill   amLODipine (NORVASC) 5 MG tablet Take 1 tablet (5 mg total) by mouth daily. 90 tablet 3   ascorbic acid (VITAMIN C) 1000 MG tablet Take by mouth.     Besifloxacin HCl (BESIVANCE) 0.6 % SUSP Place 1 drop into both eyes See admin instructions. Instill one drop into both eyes three times on day of monthly eye injections and four times on the day after     calcium-vitamin D (OSCAL WITH D) 500-200 MG-UNIT TABS tablet Take by mouth.     carboxymethylcellulose (REFRESH PLUS) 0.5 % SOLN 1 drop 3 (three) times daily as needed.     clopidogrel (PLAVIX) 75 MG tablet Take 1 tablet (75 mg total) by mouth daily. 30 tablet 1   cyclobenzaprine (FLEXERIL) 10 MG tablet Take 10 mg by mouth at bedtime.     Dextromethorphan-guaiFENesin (MUCINEX DM PO) Take 1 capsule by mouth every 12 (twelve) hours.     donepezil (ARICEPT) 5 MG tablet Take 5 mg by mouth at bedtime.     DULoxetine (CYMBALTA) 30 MG capsule Take 30 mg by mouth See admin instructions. Take one capsule (30 mg) by mouth with a 60 mg capsule for a total dose of 90 mg - daily at bedtime     DULoxetine (CYMBALTA) 60 MG capsule Take 60 mg by mouth See admin instructions. Take one capsule (60 mg) by mouth with a 30 mg capsule for a total dose of 90 mg - daily at bedtime     Fe Fum-FePoly-Vit C-Vit B3 (INTEGRA) 62.5-62.5-40-3 MG CAPS TAKE 1 CAPSULE BY MOUTH ONCE DAILY       HUMALOG KWIKPEN 100 UNIT/ML KwikPen Inject 4 Units as directed as needed. PRN if greater than 300     hydrALAZINE (APRESOLINE) 25 MG tablet Take 25 mg by mouth as needed.     HYDROcodone-acetaminophen (NORCO/VICODIN) 5-325 MG tablet Take 1 tablet by mouth at bedtime.   0   Insulin Glargine (TOUJEO SOLOSTAR [place]) Inject 22 Units into the skin daily.      linagliptin (TRADJENTA) 5 MG TABS tablet Take 5 mg by mouth daily with supper.      LORazepam (ATIVAN) 2 MG tablet Take 4 mg by mouth at bedtime.      MAGNESIUM CITRATE PO Take 1 tablet by mouth daily.      melatonin 5 MG TABS Take 5 mg by mouth.     memantine (NAMENDA) 10 MG tablet Take 10 mg by mouth 2 (two) times daily.     metoprolol tartrate (LOPRESSOR) 25 MG tablet Take 0.5 tablets (12.5 mg total) by mouth 2 (two) times daily. (Patient taking differently: Take 25 mg by mouth 2 (two) times daily. ) 60 tablet 1   Multiple Vitamins-Minerals (ICAPS) CAPS Take by mouth.     Multiple Vitamins-Minerals (PRESERVISION AREDS 2) CAPS Take 1 capsule by mouth 2 (two) times daily.      polyethylene glycol (MIRALAX / GLYCOLAX) packet Take 17 g by mouth daily. Mix  in 8 oz fluid and drink     pramipexole (MIRAPEX) 0.5 MG tablet Take 0.5 mg by mouth at bedtime.      pravastatin (PRAVACHOL) 40 MG tablet Take 40 mg by mouth daily with supper.      Propylene Glycol (SYSTANE BALANCE) 0.6 % SOLN Place 1 drop into both eyes 3 (three) times daily.      solifenacin (VESICARE) 5 MG tablet Take 5 mg by mouth daily after supper.      tamsulosin (FLOMAX) 0.4 MG CAPS Take 0.4 mg by mouth daily after supper.      vitamin C (ASCORBIC ACID) 500 MG tablet Take 500 mg by mouth daily.     PRODIGY NO CODING BLOOD GLUC test strip 2 (two) times daily. as directed     No current facility-administered medications for this visit.     Past Medical History:  Diagnosis Date   Diabetes mellitus without complication ([place])    Hyperlipidemia    Hypertension     Macular degeneration of right eye    Neuropathy    Neuropathy, diabetic ([place])    Renal insufficiency    Stroke [facility]) 11-28-2014   Also 07-25-2015   TIA (transient ischemic attack) 05/06/2016    Past Surgical History:  Procedure Laterality Date   APPENDECTOMY     BACK SURGERY     Broken leg     CERVICAL DISC SURGERY     TONSILLECTOMY      Social History   Socioeconomic History   Marital status: Married    Spouse name: Vicky   Number of children: 3   Years of education: Not on file   Highest education level: Not on file  Occupational History   Not on file  Tobacco Use   Smoking status: Former Smoker    Quit date: 12/22/1979    Years since quitting: 40.4   Smokeless tobacco: Never Used  Vaping Use   Vaping Use: Never used  Substance and Sexual Activity   Alcohol use: Yes    Alcohol/week: 1.0 standard drink    Types: 1 Glasses of wine per week    Comment: wine   Drug use: No   Sexual activity: Not on file  Other Topics Concern   Not on file  Social History Narrative   Lives at home with wife.  3 grown kids.  Retired.  Caffeine none.     Social Determinants of Health   Financial Resource Strain:    Difficulty of Paying Living Expenses: Not on file  Food Insecurity:    Worried About Charity fundraiser in the Last Year: Not on file   [employer] of Food in the Last Year: Not on file  Transportation Needs:    Lack of Transportation (Medical): Not on file   Lack of Transportation (Non-Medical): Not on file  Physical Activity:    Days of Exercise per Week: Not on file   Minutes of Exercise per Session: Not on file  Stress:    Feeling of Stress : Not on file  Social Connections:    Frequency of Communication with Friends and Family: Not on file   Frequency of Social Gatherings with Friends and Family: Not on file   Attends Religious Services: Not on file   Active Member of Clubs or Organizations: Not on file   Attends Theatre manager Meetings: Not on file   Marital Status: Not on file  Intimate Partner Violence:    Fear of Current or  Ex-Partner: Not on file   Emotionally Abused: Not on file   Physically Abused: Not on file   Sexually Abused: Not on file    Family History  Problem Relation Age of Onset   Cirrhosis Father    Heart Problems Mother        enlarge heart   Diabetes Brother    Lung cancer Sister    Alzheimer's disease Sister    Heart disease Sister    Lung cancer Brother     ROS: no fevers or chills, productive cough, hemoptysis, dysphasia, odynophagia, melena, hematochezia, dysuria, hematuria, rash, seizure activity, orthopnea, PND, claudication. Remaining systems are negative.  Physical Exam: Well-developed well-nourished in no acute distress.  Skin is warm and dry.  HEENT is normal.  Neck is supple.  Chest is clear to auscultation with normal expansion.  Cardiovascular exam is regular rate and rhythm; 3/6 systolic murmur left sternal border.  S2 is not diminished.  Abdominal exam nontender or distended. No masses palpated. Extremities show trace edema. neuro grossly intact  ECG-sinus bradycardia at a rate of 57, first-degree AV block, nonspecific T wave changes, inferior infarct.  Personally reviewed  A/P  1 aortic stenosis-we have discussed options including TAVR in the past and patient is clear he would never be willing to pursue any type of procedure.  We have therefore elected not to perform follow-up echocardiograms as it would not change our management.  2 history of chest pain-as outlined he does not want further cardiac testing and is a no CODE BLUE.  3 hypertension-blood pressure controlled.  Continue present medical regimen.  4 hyperlipidemia-continue statin.  Kirk Ruths, MD

## 2020-05-12 DIAGNOSIS — Z23 Encounter for immunization: Secondary | ICD-10-CM | POA: Diagnosis not present

## 2020-05-14 ENCOUNTER — Other Ambulatory Visit: Payer: Self-pay

## 2020-05-14 ENCOUNTER — Encounter: Payer: Self-pay | Admitting: Cardiology

## 2020-05-14 ENCOUNTER — Ambulatory Visit (INDEPENDENT_AMBULATORY_CARE_PROVIDER_SITE_OTHER): Payer: Medicare Other | Admitting: Cardiology

## 2020-05-14 VITALS — BP 142/60 | HR 57 | Ht 69.5 in | Wt 208.0 lb

## 2020-05-14 DIAGNOSIS — I350 Nonrheumatic aortic (valve) stenosis: Secondary | ICD-10-CM | POA: Diagnosis not present

## 2020-05-14 DIAGNOSIS — E7800 Pure hypercholesterolemia, unspecified: Secondary | ICD-10-CM

## 2020-05-14 DIAGNOSIS — I63132 Cerebral infarction due to embolism of left carotid artery: Secondary | ICD-10-CM | POA: Diagnosis not present

## 2020-05-14 DIAGNOSIS — I10 Essential (primary) hypertension: Secondary | ICD-10-CM | POA: Diagnosis not present

## 2020-05-14 NOTE — Patient Instructions (Signed)

## 2020-05-18 DIAGNOSIS — Z961 Presence of intraocular lens: Secondary | ICD-10-CM | POA: Diagnosis not present

## 2020-05-18 DIAGNOSIS — H40013 Open angle with borderline findings, low risk, bilateral: Secondary | ICD-10-CM | POA: Diagnosis not present

## 2020-05-18 DIAGNOSIS — H04123 Dry eye syndrome of bilateral lacrimal glands: Secondary | ICD-10-CM | POA: Diagnosis not present

## 2020-05-18 DIAGNOSIS — E119 Type 2 diabetes mellitus without complications: Secondary | ICD-10-CM | POA: Diagnosis not present

## 2020-05-21 DIAGNOSIS — H6521 Chronic serous otitis media, right ear: Secondary | ICD-10-CM | POA: Diagnosis not present

## 2020-05-26 ENCOUNTER — Other Ambulatory Visit: Payer: Self-pay

## 2020-05-26 ENCOUNTER — Encounter (INDEPENDENT_AMBULATORY_CARE_PROVIDER_SITE_OTHER): Payer: Medicare Other | Admitting: Ophthalmology

## 2020-05-26 DIAGNOSIS — H35033 Hypertensive retinopathy, bilateral: Secondary | ICD-10-CM | POA: Diagnosis not present

## 2020-05-26 DIAGNOSIS — H43813 Vitreous degeneration, bilateral: Secondary | ICD-10-CM | POA: Diagnosis not present

## 2020-05-26 DIAGNOSIS — I10 Essential (primary) hypertension: Secondary | ICD-10-CM | POA: Diagnosis not present

## 2020-05-26 DIAGNOSIS — H353231 Exudative age-related macular degeneration, bilateral, with active choroidal neovascularization: Secondary | ICD-10-CM

## 2020-06-07 DIAGNOSIS — I129 Hypertensive chronic kidney disease with stage 1 through stage 4 chronic kidney disease, or unspecified chronic kidney disease: Secondary | ICD-10-CM | POA: Diagnosis not present

## 2020-06-07 DIAGNOSIS — E1165 Type 2 diabetes mellitus with hyperglycemia: Secondary | ICD-10-CM | POA: Diagnosis not present

## 2020-06-07 DIAGNOSIS — E782 Mixed hyperlipidemia: Secondary | ICD-10-CM | POA: Diagnosis not present

## 2020-06-07 DIAGNOSIS — E1121 Type 2 diabetes mellitus with diabetic nephropathy: Secondary | ICD-10-CM | POA: Diagnosis not present

## 2020-06-14 DIAGNOSIS — E1121 Type 2 diabetes mellitus with diabetic nephropathy: Secondary | ICD-10-CM | POA: Diagnosis not present

## 2020-06-14 DIAGNOSIS — I129 Hypertensive chronic kidney disease with stage 1 through stage 4 chronic kidney disease, or unspecified chronic kidney disease: Secondary | ICD-10-CM | POA: Diagnosis not present

## 2020-06-14 DIAGNOSIS — E1165 Type 2 diabetes mellitus with hyperglycemia: Secondary | ICD-10-CM | POA: Diagnosis not present

## 2020-06-14 DIAGNOSIS — E782 Mixed hyperlipidemia: Secondary | ICD-10-CM | POA: Diagnosis not present

## 2020-06-18 DIAGNOSIS — E1165 Type 2 diabetes mellitus with hyperglycemia: Secondary | ICD-10-CM | POA: Diagnosis not present

## 2020-06-18 DIAGNOSIS — R350 Frequency of micturition: Secondary | ICD-10-CM | POA: Diagnosis not present

## 2020-06-18 DIAGNOSIS — E1129 Type 2 diabetes mellitus with other diabetic kidney complication: Secondary | ICD-10-CM | POA: Diagnosis not present

## 2020-06-21 DIAGNOSIS — N390 Urinary tract infection, site not specified: Secondary | ICD-10-CM | POA: Diagnosis not present

## 2020-06-21 DIAGNOSIS — R410 Disorientation, unspecified: Secondary | ICD-10-CM | POA: Diagnosis not present

## 2020-06-21 DIAGNOSIS — E1129 Type 2 diabetes mellitus with other diabetic kidney complication: Secondary | ICD-10-CM | POA: Diagnosis not present

## 2020-06-30 ENCOUNTER — Encounter (INDEPENDENT_AMBULATORY_CARE_PROVIDER_SITE_OTHER): Payer: Medicare Other | Admitting: Ophthalmology

## 2020-07-01 ENCOUNTER — Other Ambulatory Visit: Payer: Self-pay

## 2020-07-01 ENCOUNTER — Encounter (INDEPENDENT_AMBULATORY_CARE_PROVIDER_SITE_OTHER): Payer: Medicare Other | Admitting: Ophthalmology

## 2020-07-01 DIAGNOSIS — I10 Essential (primary) hypertension: Secondary | ICD-10-CM

## 2020-07-01 DIAGNOSIS — H353231 Exudative age-related macular degeneration, bilateral, with active choroidal neovascularization: Secondary | ICD-10-CM | POA: Diagnosis not present

## 2020-07-01 DIAGNOSIS — H35033 Hypertensive retinopathy, bilateral: Secondary | ICD-10-CM

## 2020-07-01 DIAGNOSIS — H43813 Vitreous degeneration, bilateral: Secondary | ICD-10-CM

## 2020-07-08 DIAGNOSIS — Z9622 Myringotomy tube(s) status: Secondary | ICD-10-CM | POA: Diagnosis not present

## 2020-07-08 DIAGNOSIS — H6983 Other specified disorders of Eustachian tube, bilateral: Secondary | ICD-10-CM | POA: Diagnosis not present

## 2020-07-08 DIAGNOSIS — H9213 Otorrhea, bilateral: Secondary | ICD-10-CM | POA: Diagnosis not present

## 2020-08-05 ENCOUNTER — Other Ambulatory Visit: Payer: Self-pay

## 2020-08-05 ENCOUNTER — Encounter (INDEPENDENT_AMBULATORY_CARE_PROVIDER_SITE_OTHER): Payer: Medicare Other | Admitting: Ophthalmology

## 2020-08-05 DIAGNOSIS — H43813 Vitreous degeneration, bilateral: Secondary | ICD-10-CM

## 2020-08-05 DIAGNOSIS — I10 Essential (primary) hypertension: Secondary | ICD-10-CM

## 2020-08-05 DIAGNOSIS — H35033 Hypertensive retinopathy, bilateral: Secondary | ICD-10-CM

## 2020-08-05 DIAGNOSIS — H353231 Exudative age-related macular degeneration, bilateral, with active choroidal neovascularization: Secondary | ICD-10-CM

## 2020-08-10 ENCOUNTER — Ambulatory Visit: Payer: Medicare Other

## 2020-08-10 ENCOUNTER — Encounter: Payer: Self-pay | Admitting: Podiatry

## 2020-08-10 ENCOUNTER — Ambulatory Visit (INDEPENDENT_AMBULATORY_CARE_PROVIDER_SITE_OTHER): Payer: Medicare Other | Admitting: Podiatry

## 2020-08-10 ENCOUNTER — Other Ambulatory Visit: Payer: Self-pay

## 2020-08-10 DIAGNOSIS — I700 Atherosclerosis of aorta: Secondary | ICD-10-CM | POA: Insufficient documentation

## 2020-08-10 DIAGNOSIS — L97411 Non-pressure chronic ulcer of right heel and midfoot limited to breakdown of skin: Secondary | ICD-10-CM | POA: Diagnosis not present

## 2020-08-10 DIAGNOSIS — E10319 Type 1 diabetes mellitus with unspecified diabetic retinopathy without macular edema: Secondary | ICD-10-CM | POA: Insufficient documentation

## 2020-08-10 DIAGNOSIS — E1121 Type 2 diabetes mellitus with diabetic nephropathy: Secondary | ICD-10-CM | POA: Insufficient documentation

## 2020-08-10 DIAGNOSIS — G603 Idiopathic progressive neuropathy: Secondary | ICD-10-CM | POA: Insufficient documentation

## 2020-08-10 DIAGNOSIS — F0390 Unspecified dementia without behavioral disturbance: Secondary | ICD-10-CM | POA: Insufficient documentation

## 2020-08-10 DIAGNOSIS — Z7189 Other specified counseling: Secondary | ICD-10-CM | POA: Insufficient documentation

## 2020-08-10 DIAGNOSIS — E1142 Type 2 diabetes mellitus with diabetic polyneuropathy: Secondary | ICD-10-CM

## 2020-08-10 DIAGNOSIS — E1165 Type 2 diabetes mellitus with hyperglycemia: Secondary | ICD-10-CM | POA: Insufficient documentation

## 2020-08-10 DIAGNOSIS — H5316 Psychophysical visual disturbances: Secondary | ICD-10-CM | POA: Insufficient documentation

## 2020-08-10 DIAGNOSIS — M1991 Primary osteoarthritis, unspecified site: Secondary | ICD-10-CM | POA: Insufficient documentation

## 2020-08-10 DIAGNOSIS — I63132 Cerebral infarction due to embolism of left carotid artery: Secondary | ICD-10-CM | POA: Insufficient documentation

## 2020-08-10 DIAGNOSIS — D508 Other iron deficiency anemias: Secondary | ICD-10-CM | POA: Insufficient documentation

## 2020-08-10 MED ORDER — PREGABALIN 50 MG PO CAPS: 50.0000 mg | ORAL_CAPSULE | Freq: Two times a day (BID) | ORAL | 1 refills | Status: AC

## 2020-08-11 NOTE — Progress Notes (Signed)
Subjective:  Patient ID: Kristopher Zimmerman, male    DOB: April 25, 1924,  MRN: [id number] HPI Chief Complaint  Patient presents with  . Diabetes    Diabetic neuropathy - patient states he has tried gabapentin in the past and couldn't tolerate-loss of motor function, no has blister on posterior heel right, PCP wanted feet checked, last a1c was 6.7 in Nov  . New Patient (Initial Visit)    87 y.o. male presents with the above complaint.   ROS: Denies fever chills nausea vomiting muscle aches pains calf pain back pain chest pain shortness of breath.  Past Medical History:  Diagnosis Date  . Diabetes mellitus without complication ([place])   . Hyperlipidemia   . Hypertension   . Macular degeneration of right eye   . Neuropathy   . Neuropathy, diabetic ([place])   . Renal insufficiency   . Stroke [facility]) 11-28-2014   Also 07-25-2015  . TIA (transient ischemic attack) 05/06/2016   Past Surgical History:  Procedure Laterality Date  . APPENDECTOMY    . BACK SURGERY    . Broken leg    . CERVICAL DISC SURGERY    . TONSILLECTOMY      Current Outpatient Medications:  .  pregabalin (LYRICA) 50 MG capsule, Take 1 capsule (50 mg total) by mouth 2 (two) times daily., Disp: 60 capsule, Rfl: 1 .  amLODipine (NORVASC) 5 MG tablet, Take 1 tablet (5 mg total) by mouth daily., Disp: 90 tablet, Rfl: 3 .  ascorbic acid (VITAMIN C) 1000 MG tablet, Take by mouth., Disp: , Rfl:  .  BD PEN NEEDLE NANO 2ND GEN 32G X 4 MM MISC, daily. as directed, Disp: , Rfl:  .  Besifloxacin HCl (BESIVANCE) 0.6 % SUSP, Place 1 drop into both eyes See admin instructions. Instill one drop into both eyes three times on day of monthly eye injections and four times on the day after, Disp: , Rfl:  .  calcium-vitamin D (OSCAL WITH D) 500-200 MG-UNIT TABS tablet, Take by mouth., Disp: , Rfl:  .  carboxymethylcellulose (REFRESH PLUS) 0.5 % SOLN, 1 drop 3 (three) times daily as needed., Disp: , Rfl:  .  ciprofloxacin (CILOXAN) 0.3 % ophthalmic  solution, 3 drops 2 (two) times daily., Disp: , Rfl:  .  clopidogrel (PLAVIX) 75 MG tablet, Take 1 tablet (75 mg total) by mouth daily., Disp: 30 tablet, Rfl: 1 .  cyclobenzaprine (FLEXERIL) 10 MG tablet, Take 10 mg by mouth at bedtime., Disp: , Rfl:  .  Dextromethorphan-guaiFENesin (MUCINEX DM PO), Take 1 capsule by mouth every 12 (twelve) hours., Disp: , Rfl:  .  donepezil (ARICEPT) 5 MG tablet, Take 5 mg by mouth at bedtime., Disp: , Rfl:  .  DULoxetine (CYMBALTA) 30 MG capsule, Take 30 mg by mouth See admin instructions. Take one capsule (30 mg) by mouth with a 60 mg capsule for a total dose of 90 mg - daily at bedtime, Disp: , Rfl:  .  DULoxetine (CYMBALTA) 60 MG capsule, Take 60 mg by mouth See admin instructions. Take one capsule (60 mg) by mouth with a 30 mg capsule for a total dose of 90 mg - daily at bedtime, Disp: , Rfl:  .  Fe Fum-FePoly-Vit C-Vit B3 (INTEGRA) 62.5-62.5-40-3 MG CAPS, TAKE 1 CAPSULE BY MOUTH ONCE DAILY, Disp: , Rfl:  .  gabapentin (NEURONTIN) 100 MG capsule, Take 100 mg by mouth 2 (two) times daily., Disp: , Rfl:  .  HUMALOG KWIKPEN 100 UNIT/ML KwikPen, Inject 4 Units as directed  as needed. PRN if greater than 300, Disp: , Rfl:  .  hydrALAZINE (APRESOLINE) 25 MG tablet, Take 25 mg by mouth as needed., Disp: , Rfl:  .  HYDROcodone-acetaminophen (NORCO/VICODIN) 5-325 MG tablet, Take 1 tablet by mouth at bedtime. , Disp: , Rfl: 0 .  Insulin Glargine (TOUJEO SOLOSTAR [place]), Inject 22 Units into the skin daily. , Disp: , Rfl:  .  linagliptin (TRADJENTA) 5 MG TABS tablet, Take 5 mg by mouth daily with supper. , Disp: , Rfl:  .  LORazepam (ATIVAN) 2 MG tablet, Take 4 mg by mouth at bedtime. , Disp: , Rfl:  .  MAGNESIUM CITRATE PO, Take 1 tablet by mouth daily. , Disp: , Rfl:  .  melatonin 5 MG TABS, Take 5 mg by mouth., Disp: , Rfl:  .  memantine (NAMENDA) 10 MG tablet, Take 10 mg by mouth 2 (two) times daily., Disp: , Rfl:  .  metoprolol tartrate (LOPRESSOR) 25 MG tablet, Take  0.5 tablets (12.5 mg total) by mouth 2 (two) times daily. (Patient taking differently: Take 25 mg by mouth 2 (two) times daily. ), Disp: 60 tablet, Rfl: 1 .  Multiple Vitamins-Minerals (ICAPS) CAPS, Take by mouth., Disp: , Rfl:  .  Multiple Vitamins-Minerals (PRESERVISION AREDS 2) CAPS, Take 1 capsule by mouth 2 (two) times daily. , Disp: , Rfl:  .  polyethylene glycol (MIRALAX / GLYCOLAX) packet, Take 17 g by mouth daily. Mix in 8 oz fluid and drink, Disp: , Rfl:  .  pramipexole (MIRAPEX) 0.5 MG tablet, Take 0.5 mg by mouth at bedtime. , Disp: , Rfl:  .  pravastatin (PRAVACHOL) 40 MG tablet, Take 40 mg by mouth daily with supper. , Disp: , Rfl:  .  PRODIGY NO CODING BLOOD GLUC test strip, 2 (two) times daily. as directed, Disp: , Rfl:  .  Propylene Glycol (SYSTANE BALANCE) 0.6 % SOLN, Place 1 drop into both eyes 3 (three) times daily. , Disp: , Rfl:  .  solifenacin (VESICARE) 5 MG tablet, Take 5 mg by mouth daily after supper. , Disp: , Rfl:  .  tamsulosin (FLOMAX) 0.4 MG CAPS, Take 0.4 mg by mouth daily after supper. , Disp: , Rfl:  .  vitamin C (ASCORBIC ACID) 500 MG tablet, Take 500 mg by mouth daily., Disp: , Rfl:   Allergies  Allergen Reactions  . Neurontin [Gabapentin] Other (See Comments)    Edema (per [facility])  . Ramipril Cough    (per [facility])   Review of Systems Objective:  There were no vitals filed for this visit.  General: Well developed, nourished, in no acute distress, alert and oriented x3   Dermatological: Skin is warm, dry and supple bilateral. Nails x 10 are well maintained; remaining integument appears unremarkable at this time. There are no open sores, no preulcerative lesions, no rash or signs of infection present.  There is a blister to the posterior aspect of the right heel secondary to chronic irritation that has ruptured and is drying.  There appears to be new skin it is not open deep there is no purulence no drainage.   It appears to be closed completely.  Vascular: Dorsalis Pedis artery and Posterior Tibial artery pedal pulses are 2/4 bilateral with immedate capillary fill time. Pedal hair growth present. No varicosities and no lower extremity edema present bilateral.   Neruologic: Grossly intact via light touch bilateral. Vibratory intact via tuning fork bilateral. Protective threshold with Semmes Wienstein monofilament diminished to all  pedal sites bilateral. Patellar and Achilles deep tendon reflexes 2+ bilateral. No Babinski or clonus noted bilateral.   Musculoskeletal: No gross boney pedal deformities bilateral. No pain, crepitus, or limitation noted with foot and ankle range of motion bilateral. Muscular strength 5/5 in all groups tested bilateral.  Gait: Unassisted, Nonantalgic.    Radiographs:  None taken  Assessment & Plan:   Assessment: Healing blister to the posterior aspect of the right heel.  Diabetic peripheral neuropathy right.  Plan: Discussed etiology pathology conservative versus surgical therapies.  He needs to follow-up with [facility] for diabetic shoes.  I also started him on a daily dressing change with Vaseline a small amount to the wound cover there in the day but leave open at night.  We also talked about how to offload the heel at bedtime.  Also started him on Lyrica just 50 mg 1 twice a day but we will start out at nighttime.  He did not do well with Neurontin should he have similar side effects we will discontinue this type of treatment.  Otherwise follow-up with me in 1 month for med and wound check.     Johanan Skorupski T. [place], [place]

## 2020-08-31 DEATH — deceased

## 2020-09-02 ENCOUNTER — Encounter (INDEPENDENT_AMBULATORY_CARE_PROVIDER_SITE_OTHER): Payer: Medicare Other | Admitting: Ophthalmology

## 2020-09-03 ENCOUNTER — Encounter (INDEPENDENT_AMBULATORY_CARE_PROVIDER_SITE_OTHER): Payer: Medicare Other | Admitting: Ophthalmology

## 2020-09-07 ENCOUNTER — Ambulatory Visit: Payer: Medicare Other | Admitting: Podiatry
